# Patient Record
Sex: Male | Born: 1940
Health system: Southern US, Community
[De-identification: ages and names within clinical notes are randomized; demographics above are authoritative.]

## PROBLEM LIST (undated history)

## (undated) DIAGNOSIS — I10 Essential (primary) hypertension: Secondary | ICD-10-CM

## (undated) DIAGNOSIS — G40209 Localization-related (focal) (partial) symptomatic epilepsy and epileptic syndromes with complex partial seizures, not intractable, without status epilepticus: Secondary | ICD-10-CM

## (undated) DIAGNOSIS — N3020 Other chronic cystitis without hematuria: Secondary | ICD-10-CM

## (undated) DIAGNOSIS — I491 Atrial premature depolarization: Secondary | ICD-10-CM

## (undated) DIAGNOSIS — K649 Unspecified hemorrhoids: Secondary | ICD-10-CM

## (undated) DIAGNOSIS — H53002 Unspecified amblyopia, left eye: Secondary | ICD-10-CM

## (undated) DIAGNOSIS — M1990 Unspecified osteoarthritis, unspecified site: Secondary | ICD-10-CM

## (undated) DIAGNOSIS — Z8619 Personal history of other infectious and parasitic diseases: Secondary | ICD-10-CM

## (undated) DIAGNOSIS — Z8719 Personal history of other diseases of the digestive system: Secondary | ICD-10-CM

## (undated) DIAGNOSIS — K5730 Diverticulosis of large intestine without perforation or abscess without bleeding: Secondary | ICD-10-CM

## (undated) DIAGNOSIS — R351 Nocturia: Secondary | ICD-10-CM

## (undated) DIAGNOSIS — R569 Unspecified convulsions: Secondary | ICD-10-CM

## (undated) DIAGNOSIS — R350 Frequency of micturition: Secondary | ICD-10-CM

## (undated) DIAGNOSIS — R3915 Urgency of urination: Secondary | ICD-10-CM

## (undated) HISTORY — PX: TRANSTHORACIC ECHOCARDIOGRAM: SHX275

---
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
## 1997-09-26 ENCOUNTER — Emergency Department (HOSPITAL_COMMUNITY): Admission: EM | Admit: 1997-09-26 | Discharge: 1997-09-26 | Payer: Self-pay | Admitting: Internal Medicine

## 2003-01-04 ENCOUNTER — Emergency Department (HOSPITAL_COMMUNITY): Admission: EM | Admit: 2003-01-04 | Discharge: 2003-01-04 | Payer: Self-pay | Admitting: Emergency Medicine

## 2003-01-06 ENCOUNTER — Emergency Department (HOSPITAL_COMMUNITY): Admission: AD | Admit: 2003-01-06 | Discharge: 2003-01-06 | Payer: Self-pay | Admitting: Family Medicine

## 2003-01-08 ENCOUNTER — Encounter: Payer: Self-pay | Admitting: Family Medicine

## 2003-01-08 ENCOUNTER — Emergency Department (HOSPITAL_COMMUNITY): Admission: AD | Admit: 2003-01-08 | Discharge: 2003-01-08 | Payer: Self-pay | Admitting: Family Medicine

## 2005-05-20 ENCOUNTER — Encounter: Admission: RE | Admit: 2005-05-20 | Discharge: 2005-05-20 | Payer: Self-pay | Admitting: Neurology

## 2005-12-06 ENCOUNTER — Emergency Department (HOSPITAL_COMMUNITY): Admission: EM | Admit: 2005-12-06 | Discharge: 2005-12-06 | Payer: Self-pay | Admitting: Family Medicine

## 2008-10-07 ENCOUNTER — Encounter: Admission: RE | Admit: 2008-10-07 | Discharge: 2008-10-07 | Payer: Self-pay | Admitting: Neurology

## 2009-07-25 ENCOUNTER — Ambulatory Visit (HOSPITAL_COMMUNITY): Admission: RE | Admit: 2009-07-25 | Discharge: 2009-07-25 | Payer: Self-pay | Admitting: Family Medicine

## 2009-09-15 ENCOUNTER — Ambulatory Visit
Admission: RE | Admit: 2009-09-15 | Discharge: 2009-09-15 | Payer: Self-pay | Source: Home / Self Care | Admitting: General Surgery

## 2009-09-15 HISTORY — PX: INGUINAL HERNIA REPAIR: SUR1180

## 2010-04-15 ENCOUNTER — Encounter: Payer: Self-pay | Admitting: Neurology

## 2010-06-10 LAB — BASIC METABOLIC PANEL
BUN: 7 mg/dL (ref 6–23)
CO2: 29 mEq/L (ref 19–32)
Calcium: 9.5 mg/dL (ref 8.4–10.5)
Chloride: 106 mEq/L (ref 96–112)
Creatinine, Ser: 0.89 mg/dL (ref 0.4–1.5)
GFR calc Af Amer: >60 mL/min (ref >60)
GFR calc non Af Amer: >60 mL/min (ref >60)
Glucose, Bld: 100 mg/dL — ABNORMAL HIGH (ref 70–99)
Potassium: 4.2 mEq/L (ref 3.5–5.1)
Sodium: 138 mEq/L (ref 135–145)

## 2010-06-10 LAB — DIFFERENTIAL
Eosinophils Absolute: 0.1 10*3/uL (ref 0.0–0.7)
Eosinophils Relative: 1 % (ref 0–5)
Lymphocytes Relative: 60 % — ABNORMAL HIGH (ref 12–46)
Lymphs Abs: 2.9 10*3/uL (ref 0.7–4.0)
Monocytes Absolute: 0.3 10*3/uL (ref 0.1–1.0)
Monocytes Relative: 6 % (ref 3–12)
Myelocytes: 0 %
Neutro Abs: 1.6 10*3/uL — ABNORMAL LOW (ref 1.7–7.7)
Neutrophils Relative %: 32 % — ABNORMAL LOW (ref 43–77)
nRBC: 0 /100 WBC

## 2010-06-10 LAB — CBC
Hemoglobin: 13.3 g/dL (ref 13.0–17.0)
Platelets: 221 10*3/uL (ref 150–400)
RBC: 3.99 MIL/uL — ABNORMAL LOW (ref 4.22–5.81)
WBC: 5 10*3/uL (ref 4.0–10.5)

## 2010-06-12 LAB — CBC
MCHC: 34.1 g/dL (ref 30.0–36.0)
MCV: 97.2 fL (ref 78.0–100.0)
RBC: 4.36 MIL/uL (ref 4.22–5.81)
RDW: 13.8 % (ref 11.5–15.5)

## 2010-06-12 LAB — HEMOGLOBIN A1C: Hgb A1c MFr Bld: 6 % — ABNORMAL HIGH (ref <5.7)

## 2010-06-12 LAB — COMPREHENSIVE METABOLIC PANEL
AST: 21 U/L (ref 0–37)
CO2: 27 mEq/L (ref 19–32)
Calcium: 9.3 mg/dL (ref 8.4–10.5)
Creatinine, Ser: 0.9 mg/dL (ref 0.4–1.5)
GFR calc Af Amer: >60 mL/min (ref >60)
GFR calc non Af Amer: >60 mL/min (ref >60)

## 2010-06-12 LAB — LIPID PANEL
Cholesterol: 155 mg/dL (ref 0–200)
LDL Cholesterol: 94 mg/dL (ref 0–99)

## 2011-03-06 ENCOUNTER — Other Ambulatory Visit: Payer: Self-pay | Admitting: Neurology

## 2011-03-06 DIAGNOSIS — G40309 Generalized idiopathic epilepsy and epileptic syndromes, not intractable, without status epilepticus: Secondary | ICD-10-CM

## 2011-03-06 DIAGNOSIS — M818 Other osteoporosis without current pathological fracture: Secondary | ICD-10-CM

## 2011-03-06 DIAGNOSIS — S48019A Complete traumatic amputation at unspecified shoulder joint, initial encounter: Secondary | ICD-10-CM

## 2011-03-14 ENCOUNTER — Ambulatory Visit
Admission: RE | Admit: 2011-03-14 | Discharge: 2011-03-14 | Disposition: A | Discharge status: Home / Self Care | Payer: Medicare Other | Source: Ambulatory Visit | Attending: Neurology | Admitting: Neurology

## 2011-03-14 DIAGNOSIS — M818 Other osteoporosis without current pathological fracture: Secondary | ICD-10-CM

## 2011-03-14 DIAGNOSIS — G40309 Generalized idiopathic epilepsy and epileptic syndromes, not intractable, without status epilepticus: Secondary | ICD-10-CM

## 2011-10-21 ENCOUNTER — Inpatient Hospital Stay (HOSPITAL_COMMUNITY)
Admission: EM | Admit: 2011-10-21 | Discharge: 2011-10-24 | DRG: 378 | Disposition: A | Discharge status: Home / Self Care | Payer: Medicare Other | Attending: Internal Medicine | Admitting: Internal Medicine

## 2011-10-21 ENCOUNTER — Encounter (HOSPITAL_COMMUNITY): Payer: Self-pay | Admitting: Emergency Medicine

## 2011-10-21 DIAGNOSIS — K922 Gastrointestinal hemorrhage, unspecified: Secondary | ICD-10-CM | POA: Diagnosis present

## 2011-10-21 DIAGNOSIS — E785 Hyperlipidemia, unspecified: Secondary | ICD-10-CM | POA: Diagnosis present

## 2011-10-21 DIAGNOSIS — K5730 Diverticulosis of large intestine without perforation or abscess without bleeding: Secondary | ICD-10-CM | POA: Diagnosis present

## 2011-10-21 DIAGNOSIS — I10 Essential (primary) hypertension: Secondary | ICD-10-CM | POA: Diagnosis present

## 2011-10-21 DIAGNOSIS — G40909 Epilepsy, unspecified, not intractable, without status epilepticus: Secondary | ICD-10-CM | POA: Diagnosis present

## 2011-10-21 DIAGNOSIS — K5731 Diverticulosis of large intestine without perforation or abscess with bleeding: Principal | ICD-10-CM | POA: Diagnosis present

## 2011-10-21 DIAGNOSIS — D62 Acute posthemorrhagic anemia: Secondary | ICD-10-CM | POA: Diagnosis present

## 2011-10-21 HISTORY — DX: Essential (primary) hypertension: I10

## 2011-10-21 HISTORY — DX: Unspecified convulsions: R56.9

## 2011-10-21 HISTORY — DX: Unspecified hemorrhoids: K64.9

## 2011-10-21 HISTORY — DX: Unspecified osteoarthritis, unspecified site: M19.90

## 2011-10-21 LAB — CBC WITH DIFFERENTIAL/PLATELET
Eosinophils Relative: 0 % (ref 0–5)
HCT: 32.4 % — ABNORMAL LOW (ref 39.0–52.0)
Lymphocytes Relative: 23 % (ref 12–46)
Lymphs Abs: 1.8 10*3/uL (ref 0.7–4.0)
MCV: 90.5 fL (ref 78.0–100.0)
Monocytes Absolute: 0.4 10*3/uL (ref 0.1–1.0)
RBC: 3.58 MIL/uL — ABNORMAL LOW (ref 4.22–5.81)
WBC: 8 10*3/uL (ref 4.0–10.5)

## 2011-10-21 LAB — COMPREHENSIVE METABOLIC PANEL
ALT: 8 U/L (ref 0–53)
CO2: 23 mEq/L (ref 19–32)
Calcium: 8.8 mg/dL (ref 8.4–10.5)
Creatinine, Ser: 0.77 mg/dL (ref 0.50–1.35)
GFR calc Af Amer: >90 mL/min (ref >90)
GFR calc non Af Amer: 89 mL/min — ABNORMAL LOW (ref >90)
Glucose, Bld: 99 mg/dL (ref 70–99)

## 2011-10-21 LAB — ABO/RH: ABO/RH(D): A POS

## 2011-10-21 LAB — MAGNESIUM: Magnesium: 1.7 mg/dL (ref 1.5–2.5)

## 2011-10-21 LAB — PHOSPHORUS: Phosphorus: 3.3 mg/dL (ref 2.3–4.6)

## 2011-10-21 LAB — PROTIME-INR
INR: 1.09 (ref 0.00–1.49)
INR: 1.08 (ref 0.00–1.49)
Prothrombin Time: 14.2 seconds (ref 11.6–15.2)

## 2011-10-21 LAB — APTT: aPTT: 25 seconds (ref 24–37)

## 2011-10-21 MED ORDER — LISINOPRIL 20 MG PO TABS
20.0000 mg | ORAL_TABLET | Freq: Every day | ORAL | Status: DC
  Administered 2011-10-22 – 2011-10-23 (×2): 20 mg via ORAL
  Filled 2011-10-21 (×3): qty 1

## 2011-10-21 MED ORDER — PANTOPRAZOLE SODIUM 40 MG IV SOLR
40.0000 mg | Freq: Two times a day (BID) | INTRAVENOUS | Status: DC
  Administered 2011-10-21 – 2011-10-23 (×4): 40 mg via INTRAVENOUS
  Filled 2011-10-21 (×5): qty 40

## 2011-10-21 MED ORDER — PHENYTOIN SODIUM EXTENDED 100 MG PO CAPS
300.0000 mg | ORAL_CAPSULE | Freq: Every day | ORAL | Status: DC
  Administered 2011-10-21 – 2011-10-23 (×3): 300 mg via ORAL
  Filled 2011-10-21 (×4): qty 3

## 2011-10-21 MED ORDER — ONDANSETRON HCL 4 MG/2ML IJ SOLN: 4.0000 mg | Freq: Four times a day (QID) | INTRAMUSCULAR | Status: DC | PRN

## 2011-10-21 MED ORDER — SODIUM CHLORIDE 0.9 % IV SOLN
INTRAVENOUS | Status: DC
  Administered 2011-10-21: 19:00:00 via INTRAVENOUS
  Administered 2011-10-22: 125 mL/h via INTRAVENOUS
  Administered 2011-10-22: 03:00:00 via INTRAVENOUS

## 2011-10-21 MED ORDER — SODIUM CHLORIDE 0.9 % IJ SOLN
3.0000 mL | Freq: Two times a day (BID) | INTRAMUSCULAR | Status: DC
  Administered 2011-10-23 – 2011-10-24 (×4): 3 mL via INTRAVENOUS

## 2011-10-21 MED ORDER — SIMVASTATIN 10 MG PO TABS
10.0000 mg | ORAL_TABLET | Freq: Every day | ORAL | Status: DC
  Administered 2011-10-22 – 2011-10-23 (×2): 10 mg via ORAL
  Filled 2011-10-21 (×3): qty 1

## 2011-10-21 MED ORDER — ONDANSETRON HCL 4 MG PO TABS: 4.0000 mg | ORAL_TABLET | Freq: Four times a day (QID) | ORAL | Status: DC | PRN

## 2011-10-21 NOTE — ED Provider Notes (Signed)
History     CSN: [id number]  Arrival date & time 10/21/11  1656   First MD Initiated Contact with Patient 10/21/11 1936      Chief Complaint  Patient presents with  . Rectal Bleeding    (Consider location/radiation/quality/duration/timing/severity/associated sxs/prior treatment) Patient is a 71 y.o. male presenting with hematochezia. The history is provided by the patient.  Rectal Bleeding    patient here complaining of rectal bleeding which began today. Denies any abdominal pain or vomiting. No fever noted. Blood has been bright red. History of hemorrhoids in the past. Denies any dizziness or near-syncope. No hematemesis. Patient called EMS and was transported here  Past Medical History  Diagnosis Date  . Hemorrhoids   . Arthritis   . Sleep disorder   . Seizures     Past Surgical History  Procedure Date  . Hernia repair   . Gallstone     Family History  Problem Relation Age of Onset  . Cancer Mother   . Cancer Sister   . Cancer Brother     History  Substance Use Topics  . Smoking status: Former Games developer  . Smokeless tobacco: Never Used  . Alcohol Use: No      Review of Systems  Gastrointestinal: Positive for hematochezia.  All other systems reviewed and are negative.    Allergies  Penicillins  Home Medications   Current Outpatient Rx  Name Route Sig Dispense Refill  . ASPIRIN 325 MG PO TABS Oral Take 325 mg by mouth as needed. Knee pain.    . IBUPROFEN 800 MG PO TABS Oral Take 800 mg by mouth 2 (two) times daily as needed. Knee pain.    Marland Kitchen LISINOPRIL 20 MG PO TABS Oral Take 20 mg by mouth daily.    Marland Kitchen PHENYTOIN SODIUM EXTENDED 100 MG PO CAPS Oral Take 300 mg by mouth at bedtime.    Marland Kitchen PRAVASTATIN SODIUM 20 MG PO TABS Oral Take 20 mg by mouth daily.      BP 117/73  Pulse 77  Temp 97.7 F (36.5 C) (Oral)  Resp 13  Ht 5\' 6"  (1.676 m)  Wt 157 lb (71.215 kg)  BMI 25.34 kg/m2  SpO2 100%  Physical Exam  Nursing note and vitals  reviewed. Constitutional: He is oriented to person, place, and time. He appears well-developed and well-nourished.  Non-toxic appearance. No distress.  HENT:  Head: Normocephalic and atraumatic.  Eyes: Conjunctivae, EOM and lids are normal. Pupils are equal, round, and reactive to light.  Neck: Normal range of motion. Neck supple. No tracheal deviation present. No mass present.  Cardiovascular: Normal rate, regular rhythm and normal heart sounds.  Exam reveals no gallop.   No murmur heard. Pulmonary/Chest: Effort normal and breath sounds normal. No stridor. No respiratory distress. He has no decreased breath sounds. He has no wheezes. He has no rhonchi. He has no rales.  Abdominal: Soft. Normal appearance and bowel sounds are normal. He exhibits no distension. There is no tenderness. There is no rebound and no CVA tenderness.  Genitourinary:       Gross blood noted in rectum  Musculoskeletal: Normal range of motion. He exhibits no edema and no tenderness.  Neurological: He is alert and oriented to person, place, and time. He has normal strength. No cranial nerve deficit or sensory deficit. GCS eye subscore is 4. GCS verbal subscore is 5. GCS motor subscore is 6.  Skin: Skin is warm and dry. No abrasion and no rash noted.  Psychiatric: He  has a normal mood and affect. His speech is normal and behavior is normal.    ED Course  Procedures (including critical care time)   Labs Reviewed  CBC WITH DIFFERENTIAL  BASIC METABOLIC PANEL  COMPREHENSIVE METABOLIC PANEL  PROTIME-INR  APTT  TYPE AND SCREEN   No results found.   No diagnosis found.    MDM  Pt to be admitted for eval of gi bleed        Toy Baker, MD 10/21/11 2358

## 2011-10-21 NOTE — ED Notes (Signed)
Please call Millicent (daughter) when pt's status is known. She will pick up pt if pt gets discharged. [phone]
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
## 2011-10-21 NOTE — ED Notes (Signed)
Per EMS pt stated blood in the stool. Pt states stool is a light color. Pt denies nausea and vomiting. Pt denies pain. Pt states hx of hemorrhoids. Pt states he take ibuprofen every morning and took some this morning.

## 2011-10-21 NOTE — ED Notes (Signed)
[id number]<AV> Expected date:10/21/11<BR> Expected time: 4:33 PM<BR> Means of arrival:Ambulance<BR> Comments:<BR> Rectal bleeding

## 2011-10-21 NOTE — H&P (Signed)
[facility] History and Physical  Cauy Melody WUJ:[id number] DOB: [birth year] DOA: 10/21/2011  Referring physician: ED physician PCP: No primary provider on file.   Chief Complaint: blood in stool  HPI:  71 year old male with history of seizure disorder, HTN and dyslipidemia who presented with complaints of noticing blood in stool on the morning of this admission. Patient reported history of hemorrhoids in past and occasional bleed. There was no associated nausea or vomiting, no abdominal pain, no lightheadedness or loss of consciousness. No complaints of chest pain, no shortness of breath, no cough. No fever or chills.  Assessment and Plan:  Principal Problem:  *Acute blood loss anemia - secondary to rectal bleed likely secondary to aspirin - hold aspirin - hemoglobin on admission 11.4 - will continue to monitor CBC - no transfusion indicated at this time - may continue IV fluids  Active Problems: HTN - continue lisinopril  Seizure disorder - continue dilantin  Dyslipidemia - continue statin   Code Status: Full Family Communication: Pt at bedside Disposition Plan: PT evaluation    Review of Systems:  Constitutional: Negative for fever, chills and malaise/fatigue. Negative for diaphoresis.  HENT: Negative for hearing loss, ear pain, nosebleeds, congestion, sore throat, neck pain, tinnitus and ear discharge.   Eyes: Negative for blurred vision, double vision, photophobia, pain, discharge and redness.  Respiratory: Negative for cough, hemoptysis, sputum production, shortness of breath, wheezing and stridor.   Cardiovascular: Negative for chest pain, palpitations, orthopnea, claudication and leg swelling.  Gastrointestinal: per HPI Genitourinary: Negative for dysuria, urgency, frequency, hematuria and flank pain.  Musculoskeletal: Negative for myalgias, back pain, joint pain and falls.  Skin: Negative for itching and rash.  Neurological: Negative for dizziness and  weakness. Negative for tingling, tremors, sensory change, speech change, focal weakness, loss of consciousness and headaches.  Endo/Heme/Allergies: Negative for environmental allergies and polydipsia. Does not bruise/bleed easily.  Psychiatric/Behavioral: Negative for suicidal ideas. The patient is not nervous/anxious.      Past Medical History  Diagnosis Date  . Hemorrhoids   . Arthritis   . Sleep disorder   . Seizures     Past Surgical History  Procedure Date  . Hernia repair   . Gallstone     Social History:  reports that he has quit smoking. He has never used smokeless tobacco. He reports that he does not drink alcohol or use illicit drugs.  Allergies  Allergen Reactions  . Penicillins Itching    Family History  Problem Relation Age of Onset  . Cancer Mother   . Cancer Sister   . Cancer Brother     Prior to Admission medications   Medication Sig Start Date End Date Taking? Authorizing Provider  aspirin 325 MG tablet Take 325 mg by mouth as needed. Knee pain.   Yes Historical Provider, MD  ibuprofen (ADVIL,MOTRIN) 800 MG tablet Take 800 mg by mouth 2 (two) times daily as needed. Knee pain.   Yes Historical Provider, MD  lisinopril (PRINIVIL,ZESTRIL) 20 MG tablet Take 20 mg by mouth daily.   Yes Historical Provider, MD  phenytoin (DILANTIN) 100 MG ER capsule Take 300 mg by mouth at bedtime.   Yes Historical Provider, MD  pravastatin (PRAVACHOL) 20 MG tablet Take 20 mg by mouth daily.   Yes Historical Provider, MD    Physical Exam: Filed Vitals:   10/21/11 1658 10/21/11 1707 10/21/11 1841  BP: 155/81  117/73  Pulse: 89  77  Temp: 97.7 F (36.5 C)    TempSrc:  Oral    Resp: 18  13  Height: 5\' 6"  (1.676 m)    Weight: 157 lb (71.215 kg)    SpO2: 99% 98% 100%    Physical Exam  Constitutional: Appears well-developed and well-nourished. No distress.  HENT: Normocephalic. External right and left ear normal. Oropharynx is clear and moist.  Eyes: Conjunctivae and EOM  are normal. PERRLA, no scleral icterus.  Neck: Normal ROM. Neck supple. No JVD. No tracheal deviation. No thyromegaly.  CVS: RRR, S1/S2 +, no murmurs, no gallops, no carotid bruit.  Pulmonary: Effort and breath sounds normal, no stridor, rhonchi, wheezes, rales.  Abdominal: Soft. BS +,  no distension, tenderness, rebound or guarding.  Musculoskeletal: Normal range of motion. No edema and no tenderness.  Lymphadenopathy: No lymphadenopathy noted, cervical, inguinal. Neuro: Alert. Normal reflexes, muscle tone coordination. No cranial nerve deficit. Skin: Skin is warm and dry. No rash noted. Not diaphoretic. No erythema. No pallor.  Psychiatric: Normal mood and affect. Behavior, judgment, thought content normal.   Labs on Admission:  Basic Metabolic Panel:  Lab 10/21/11 9147  NA 136  K 4.0  CL 102  CO2 23  GLUCOSE 99  BUN 10  CREATININE 0.77  CALCIUM 8.8  MG --  PHOS --   Liver Function Tests:  Lab 10/21/11 2010  AST 11  ALT 8  ALKPHOS 96  BILITOT 0.2*  PROT 6.7  ALBUMIN 3.4*   No results found for this basename: LIPASE:5,AMYLASE:5 in the last 168 hours No results found for this basename: AMMONIA:5 in the last 168 hours CBC:  Lab 10/21/11 2010  WBC 8.0  NEUTROABS 5.8  HGB 11.4*  HCT 32.4*  MCV 90.5  PLT 227   Cardiac Enzymes: No results found for this basename: CKTOTAL:5,CKMB:5,CKMBINDEX:5,TROPONINI:5 in the last 168 hours BNP: No components found with this basename: POCBNP:5 CBG: No results found for this basename: GLUCAP:5 in the last 168 hours  Radiological Exams on Admission: No results found.  EKG: Normal sinus rhythm, no ST/T wave changes  Debbora Presto, MD  [facility] Pager [phone]  If 7PM-7AM, please contact night-coverage [email or web address] Password [facility] 10/21/2011, 9:37 PM

## 2011-10-22 DIAGNOSIS — G40909 Epilepsy, unspecified, not intractable, without status epilepticus: Secondary | ICD-10-CM

## 2011-10-22 DIAGNOSIS — E785 Hyperlipidemia, unspecified: Secondary | ICD-10-CM

## 2011-10-22 DIAGNOSIS — I10 Essential (primary) hypertension: Secondary | ICD-10-CM

## 2011-10-22 LAB — CBC
HCT: 29.2 % — ABNORMAL LOW (ref 39.0–52.0)
Hemoglobin: 10.2 g/dL — ABNORMAL LOW (ref 13.0–17.0)
MCH: 31.9 pg (ref 26.0–34.0)
MCV: 91.3 fL (ref 78.0–100.0)
RBC: 3.2 MIL/uL — ABNORMAL LOW (ref 4.22–5.81)

## 2011-10-22 LAB — GLUCOSE, CAPILLARY: Glucose-Capillary: 105 mg/dL — ABNORMAL HIGH (ref 70–99)

## 2011-10-22 LAB — COMPREHENSIVE METABOLIC PANEL
AST: 10 U/L (ref 0–37)
Albumin: 3 g/dL — ABNORMAL LOW (ref 3.5–5.2)
Calcium: 8.6 mg/dL (ref 8.4–10.5)
Creatinine, Ser: 0.76 mg/dL (ref 0.50–1.35)
GFR calc non Af Amer: 89 mL/min — ABNORMAL LOW (ref >90)
Total Protein: 6 g/dL (ref 6.0–8.3)

## 2011-10-22 LAB — MRSA PCR SCREENING: MRSA by PCR: NEGATIVE

## 2011-10-22 NOTE — Progress Notes (Signed)
[facility] PROGRESS NOTE  Phillip Hill ZOX:[id number] DOB: [birth year] DOA: 10/21/2011 PCP: No primary provider on file.  Assessment/Plan: Principal Problem:  *Acute blood loss anemia Active Problems:  HTN (hypertension)  Seizure disorder  Dyslipidemia  1. ?hemmorrhoidal bleed- no further episodes yet, will heme test stools when he has one, trend CBC.  If stable may D/C home with GI done as outpatient, stop IVF, AM labs 2. HTN- stable 3. seizure d/o- dilantin   4. Anemia- ABLA vs dilutional, trend, transfuse if necessary    Code Status: full Family Communication: none Disposition Plan: home    HPI/Subjective: Had episode of bright red blood in stool yesterday- had another episode 25 years ago and had a colonoscopy at that point with no cancer found- said a "pocket" opened and bleed No BM since in hospital   Objective: Filed Vitals:   10/21/11 1841 10/21/11 2339 10/22/11 0013 10/22/11 0541  BP: 117/73 129/73 130/84 111/64  Pulse: 77 89 81 80  Temp:  98.8 F (37.1 C) 97.9 F (36.6 C) 98.6 F (37 C)  TempSrc:  Oral Oral Oral  Resp: 13 16 18 18   Height:   5\' 6"  (1.676 m)   Weight:   71.215 kg (157 lb) 71.215 kg (157 lb)  SpO2: 100% 99% 98% 100%    Intake/Output Summary (Last 24 hours) at 10/22/11 1144 Last data filed at 10/22/11 0702  Gross per 24 hour  Intake 1547.92 ml  Output    700 ml  Net 847.92 ml    Exam:  General: Appears well-developed and well-nourished. No distress.  CVS: RRR, S1/S2 +, no murmurs, no gallops, no carotid bruit.  Pulmonary: Effort and breath sounds normal, no stridor, rhonchi, wheezes, rales.  Abdominal: Soft. BS +, no distension, tenderness, rebound or guarding.  Neuro: Alert. Normal reflexes, muscle tone coordination. No cranial nerve deficit.  Skin: Skin is warm and dry. No rash noted. Not diaphoretic. No erythema. No pallor.     Data Reviewed: Basic Metabolic Panel:  Lab 10/22/11 8119 10/21/11 2255 10/21/11 2010  NA 138  -- 136  K 3.8 -- 4.0  CL 106 -- 102  CO2 23 -- 23  GLUCOSE 94 -- 99  BUN 9 -- 10  CREATININE 0.76 -- 0.77  CALCIUM 8.6 -- 8.8  MG -- 1.7 --  PHOS -- 3.3 --   Liver Function Tests:  Lab 10/22/11 0455 10/21/11 2010  AST 10 11  ALT 7 8  ALKPHOS 91 96  BILITOT 0.3 0.2*  PROT 6.0 6.7  ALBUMIN 3.0* 3.4*   No results found for this basename: LIPASE:5,AMYLASE:5 in the last 168 hours No results found for this basename: AMMONIA:5 in the last 168 hours CBC:  Lab 10/22/11 0455 10/21/11 2010  WBC 6.8 8.0  NEUTROABS -- 5.8  HGB 10.2* 11.4*  HCT 29.2* 32.4*  MCV 91.3 90.5  PLT 206 227   Cardiac Enzymes: No results found for this basename: CKTOTAL:5,CKMB:5,CKMBINDEX:5,TROPONINI:5 in the last 168 hours BNP (last 3 results) No results found for this basename: PROBNP:3 in the last 8760 hours CBG:  Lab 10/22/11 0747  GLUCAP 105*    Recent Results (from the past 240 hour(s))  MRSA PCR SCREENING     Status: Normal   Collection Time   10/22/11 12:29 AM      Component Value Range Status Comment   MRSA by PCR NEGATIVE  NEGATIVE Final      Studies: No results found.  Scheduled Meds:   . lisinopril  20  mg Oral Daily  . pantoprazole (PROTONIX) IV  40 mg Intravenous Q12H  . phenytoin  300 mg Oral QHS  . simvastatin  10 mg Oral q1800  . sodium chloride  3 mL Intravenous Q12H  - Continuous Infusions:   . DISCONTD: sodium chloride 125 mL/hr (10/22/11 1001)    Principal Problem:  *Acute blood loss anemia Active Problems:  HTN (hypertension)  Seizure disorder  Dyslipidemia    Time spent: 103    Manya Balash  [facility] 10/22/2011, 11:44 AM  LOS: 1 day

## 2011-10-22 NOTE — Evaluation (Signed)
Physical Therapy One time Evaluation Patient Details Name: Phillip Hill MRN: [id number] DOB: October 10, 1940 Today's Date: 10/22/2011 Time: 7829-5621 PT Time Calculation (min): 11 min  PT Assessment / Plan / Recommendation Clinical Impression  Pt presents with acute blood loss anemia.  Tolerated ambulation in hallway at supervision level with no AD and no c/o dizziness/lightheadedness.  Pt is Mod I for bed mobility and transfers.  Pt will not require any further follow up therapy at home or in acute venue.  PT to sign off on pt at this time.     PT Assessment  Patent does not need any further PT services    Follow Up Recommendations  No PT follow up    Barriers to Discharge        Equipment Recommendations  None recommended by PT    Recommendations for Other Services     Frequency      Precautions / Restrictions Precautions Precautions: None Restrictions Weight Bearing Restrictions: No   Pertinent Vitals/Pain No pain      Mobility  Bed Mobility Bed Mobility: Supine to Sit Supine to Sit: 6: Modified independent (Device/Increase time) Transfers Transfers: Sit to Stand;Stand to Sit Sit to Stand: 6: Modified independent (Device/Increase time) Stand to Sit: 6: Modified independent (Device/Increase time) Ambulation/Gait Ambulation/Gait Assistance: 4: Min guard;5: Supervision Ambulation Distance (Feet): 200 Feet Assistive device: None Ambulation/Gait Assistance Details: Min/guard initially then supervision for safety.  Gait Pattern: Step-through pattern;Within Functional Limits Gait velocity: WFL Stairs: No Wheelchair Mobility Wheelchair Mobility: No    Exercises     PT Diagnosis:    PT Problem List:   PT Treatment Interventions:     PT Goals    Visit Information  Last PT Received On: 10/22/11 Assistance Needed: +1    Subjective Data  Subjective: I'll get up Patient Stated Goal: to get back home   Prior Functioning  Home Living Lives With: Other (Comment) (Lives  with brother) Available Help at Discharge: Available 24 hours/day Type of Home: House Home Access: Level entry Home Layout: One level Home Adaptive Equipment: Straight cane Prior Function Level of Independence: Independent Able to Take Stairs?: Yes Communication Communication: No difficulties    Cognition  Overall Cognitive Status: Appears within functional limits for tasks assessed/performed Arousal/Alertness: Awake/alert Orientation Level: Appears intact for tasks assessed Behavior During Session: [facility] for tasks performed    Extremity/Trunk Assessment Right Lower Extremity Assessment RLE ROM/Strength/Tone: [facility] for tasks assessed RLE Coordination: WFL - gross/fine motor Left Lower Extremity Assessment LLE ROM/Strength/Tone: WFL for tasks assessed LLE Coordination: WFL - gross/fine motor Trunk Assessment Trunk Assessment: Normal   Balance    End of Session PT - End of Session Activity Tolerance: Patient tolerated treatment well Patient left: in chair;with call bell/phone within reach  GP     Page, Meribeth Mattes 10/22/2011, 1:42 PM

## 2011-10-23 DIAGNOSIS — D62 Acute posthemorrhagic anemia: Secondary | ICD-10-CM

## 2011-10-23 DIAGNOSIS — K922 Gastrointestinal hemorrhage, unspecified: Secondary | ICD-10-CM

## 2011-10-23 DIAGNOSIS — K5730 Diverticulosis of large intestine without perforation or abscess without bleeding: Secondary | ICD-10-CM

## 2011-10-23 LAB — CBC
MCH: 32.4 pg (ref 26.0–34.0)
MCV: 92.3 fL (ref 78.0–100.0)
Platelets: 206 10*3/uL (ref 150–400)
RBC: 2.99 MIL/uL — ABNORMAL LOW (ref 4.22–5.81)
RDW: 13.3 % (ref 11.5–15.5)
WBC: 6.2 10*3/uL (ref 4.0–10.5)

## 2011-10-23 LAB — BASIC METABOLIC PANEL
CO2: 25 mEq/L (ref 19–32)
Calcium: 8.8 mg/dL (ref 8.4–10.5)
Chloride: 105 mEq/L (ref 96–112)
Creatinine, Ser: 0.81 mg/dL (ref 0.50–1.35)
GFR calc Af Amer: >90 mL/min (ref >90)
Sodium: 138 mEq/L (ref 135–145)

## 2011-10-23 LAB — GLUCOSE, CAPILLARY: Glucose-Capillary: 85 mg/dL (ref 70–99)

## 2011-10-23 MED ORDER — PEG-KCL-NACL-NASULF-NA ASC-C 100 G PO SOLR
1.0000 | Freq: Once | ORAL | Status: AC
  Administered 2011-10-23: 100 g via ORAL
  Filled 2011-10-23: qty 1

## 2011-10-23 NOTE — Consult Note (Signed)
Referring Provider: [facility]-Dr. Jomarie Longs Primary Care Physician:  No primary provider on file. Primary Gastroenterologist:  none  Reason for Consultation:   Lower Gi bleed  HPI: Phillip Hill is a 71 y.o. male admitted to the hospital on 10/21/2011 with acute onset of rectal bleeding . Patient reports that he had onset on Monday morning with a bowel movement associated with a large amount of bright red blood. He said he had no associated abdominal pain, cramping, or rectal discomfort. He had 3 episodes total at home prior to presenting to the emergency room and says all of these were large amount of dark and red blood. Since arrival to the hospital he has not had any further active bleeding. He says recently he has been feeling well, has no complaints of abdominal discomfort, no nausea, heartburn, indigestion, dysphagia etc. He says he does have some difficulty with constipation but had not been constipated recently. He has not noted any changes in his bowel habits.  The patient does take one whole aspirin daily and also uses ibuprofen as needed. He reports that he had a remote colonoscopy done by Dr. Victorino Dike  20-25 years ago and was told that he had "pockets" in his colon at that time. He is no history of GI bleeding since but says he was having some bright blood at that time- but not did not require hospitalization. Family history is negative for colon cancer and polyps.  Hemoglobin on addition 10/21/2011 was 11.4, this is drifted to 9.7 today.   Past Medical History  Diagnosis Date  . Hemorrhoids   . Arthritis   . Sleep disorder   . Seizures     Past Surgical History  Procedure Date  . Hernia repair   . Gallstone     Prior to Admission medications   Medication Sig Start Date End Date Taking? Authorizing Provider  aspirin 325 MG tablet Take 325 mg by mouth as needed. Knee pain.   Yes Historical Provider, MD  ibuprofen (ADVIL,MOTRIN) 800 MG tablet Take 800 mg by mouth 2  (two) times daily as needed. Knee pain.   Yes Historical Provider, MD  lisinopril (PRINIVIL,ZESTRIL) 20 MG tablet Take 20 mg by mouth daily.   Yes Historical Provider, MD  phenytoin (DILANTIN) 100 MG ER capsule Take 300 mg by mouth at bedtime.   Yes Historical Provider, MD  pravastatin (PRAVACHOL) 20 MG tablet Take 20 mg by mouth daily.   Yes Historical Provider, MD    Current Facility-Administered Medications  Medication Dose Route Frequency Provider Last Rate Last Dose  . lisinopril (PRINIVIL,ZESTRIL) tablet 20 mg  20 mg Oral Daily Dorothea Ogle, MD   20 mg at 10/23/11 0914  . ondansetron (ZOFRAN) tablet 4 mg  4 mg Oral Q6H PRN Dorothea Ogle, MD       Or  . ondansetron [facility]) injection 4 mg  4 mg Intravenous Q6H PRN Dorothea Ogle, MD      . peg 3350 powder (MOVIPREP) 100 G kit 100 g  1 kit Oral Once Amy S Esterwood, PA      . phenytoin (DILANTIN) ER capsule 300 mg  300 mg Oral QHS Dorothea Ogle, MD   300 mg at 10/22/11 2138  . simvastatin (ZOCOR) tablet 10 mg  10 mg Oral q1800 Dorothea Ogle, MD   10 mg at 10/22/11 1840  . sodium chloride 0.9 % injection 3 mL  3 mL Intravenous Q12H Dorothea Ogle, MD   3 mL  at 10/23/11 0915  . DISCONTD: pantoprazole (PROTONIX) injection 40 mg  40 mg Intravenous Q12H Dorothea Ogle, MD   40 mg at 10/23/11 0914    Allergies as of 10/21/2011 - Review Complete 10/21/2011  Allergen Reaction Noted  . Penicillins Itching 10/21/2011    Family History  Problem Relation Age of Onset  . Cancer Mother   . Cancer Sister   . Cancer Brother     History   Social History  . Marital Status: Divorced    Spouse Name: N/A    Number of Children: N/A  . Years of Education: N/A   Occupational History  . Not on file.   Social History Main Topics  . Smoking status: Former Games developer  . Smokeless tobacco: Never Used  . Alcohol Use: No  . Drug Use: No  . Sexually Active: Yes    Birth Control/ Protection: Condom   Other Topics Concern  . Not on file   Social  History Narrative  . No narrative on file    Review of Systems: Pertinent positive and negative review of systems were noted in the above HPI section.  All other review of systems was otherwise negative.Marland Kitchen  Physical Exam: Vital signs in last 24 hours: Temp:  [98.1 F (36.7 C)-98.3 F (36.8 C)] 98.3 F (36.8 C) (07/31 0559) Pulse Rate:  [63-73] 71  (07/31 0559) Resp:  [18] 18  (07/31 0559) BP: (124-133)/(57-72) 124/57 mmHg (07/31 0559) SpO2:  [98 %-99 %] 99 % (07/31 0559) Weight:  [151 lb 6.4 oz (68.675 kg)] 151 lb 6.4 oz (68.675 kg) (07/31 0700) Last BM Date: 10/21/11 General:   Alert,  Well-developed, well-nourished, pleasant and cooperative in NAD Head:  Normocephalic and atraumatic. Eyes:  Sclera clear, no icterus.   Conjunctiva pink. Ears:  Normal auditory acuity. Nose:  No deformity, discharge,  or lesions. Mouth:  No deformity or lesions.   Neck:  Supple; no masses or thyromegaly. Lungs:  Clear throughout to auscultation.   No wheezes, crackles, or rhonchi. Heart:  Regular rate and rhythm; no murmurs, clicks, rubs,  or gallops. Abdomen:  Soft,nontender, BS active,nonpalp mass or hsm.   Rectal:  Deferred  Msk:  Symmetrical without gross deformities. . Pulses:  Normal pulses noted. Extremities:  Without clubbing or edema. Neurologic:  Alert and  oriented x4;  grossly normal neurologically. Skin:  Intact without significant lesions or rashes.. Psych:  Alert and cooperative. Normal mood and affect.  Intake/Output from previous day: 07/30 0701 - 07/31 0700 In: 1787.9 [P.O.:240; I.V.:1547.9] Out: 2100 [Urine:2100] Intake/Output this shift: Total I/O In: -  Out: 700 [Urine:700]  Lab Results:  [facility] 10/23/11 0445 10/22/11 0455 10/21/11 2010  WBC 6.2 6.8 8.0  HGB 9.7* 10.2* 11.4*  HCT 27.6* 29.2* 32.4*  PLT 206 206 227   BMET  Basename 10/23/11 0445 10/22/11 0455 10/21/11 2010  NA 138 138 136  K 3.6 3.8 4.0  CL 105 106 102  CO2 25 23 23   GLUCOSE 91 94 99    BUN 9 9 10   CREATININE 0.81 0.76 0.77  CALCIUM 8.8 8.6 8.8   LFT  Basename 10/22/11 0455  PROT 6.0  ALBUMIN 3.0*  AST 10  ALT 7  ALKPHOS 91  BILITOT 0.3  BILIDIR --  IBILI --   PT/INR  Basename 10/21/11 2255 10/21/11 2010  LABPROT 14.2 14.3  INR 1.08 1.09    IMPRESSION:  #53 71 year old man with acute lower GI bleed, painless and resolved. Course is most consistent  with an acute diverticular hemorrhage. #2 normocytic anemia secondary to acute blood loss #3 history of seizure disorder #4 hyperlipidemia #5 history of hypertension  PLAN: #1 patient has not had a colonoscopy for over 20 years, and therefore repeat colonoscopy is indicated at this time. Procedure is scheduled with Dr. Christella Hartigan for tomorrow August 1 at about 2:30 PM. Procedure was discussed in detail with the patient and he is agreeable to proceed. He'll be placed back on a clear liquid diet, and undergo bowel prep this evening #2 continue serial hemoglobin #3 hold aspirin   Amy Esterwood  10/23/2011, 1:13 PM    ________________________________________________________________________  [facility] MD note:  I personally examined the patient, reviewed the data and agree with the assessment and plan described above.  Very nice man with resolved bleeding.  Planning on colonsocoyp tomorrow.   Rob Bunting, MD [facility] Pager [phone]

## 2011-10-23 NOTE — Progress Notes (Signed)
[facility] PROGRESS NOTE  Phillip Hill ZOX:[id number] DOB: [birth year] DOA: 10/21/2011 PCP: No primary provider on file.  Assessment/Plan: 1. Lower GI bleed, Monday night 3 episodes (cupful, per patient), I dont suspect a hemmorrhoidal bleed given the large volume- could be diverticular/AVM/malignancy etc no further episodes yet, Discussed with [facility], If a colonoscopy could be done in next 24h will keep him here otherwise pursue as outpatient.  2. Acute blood loss anemia: from 1, baseline Hb in 6/22 was 13.3, has trended down to 9.7 now, no overt further bleeding since Monday, transfuse if continues to trend down or symptomatic 3. HTN- stable 4. seizure d/o- dilantin     Code Status: full Family Communication: none Disposition Plan: home    HPI/Subjective: No further bleeding   Objective: Filed Vitals:   10/22/11 1410 10/22/11 2245 10/23/11 0559 10/23/11 0700  BP: 133/62 128/72 124/57   Pulse: 63 73 71   Temp: 98.1 F (36.7 C) 98.2 F (36.8 C) 98.3 F (36.8 C)   TempSrc: Oral Oral Oral   Resp: 18 18 18    Height:      Weight:    68.675 kg (151 lb 6.4 oz)  SpO2: 99% 98% 99%     Intake/Output Summary (Last 24 hours) at 10/23/11 1135 Last data filed at 10/23/11 0914  Gross per 24 hour  Intake    240 ml  Output   2000 ml  Net  -1760 ml    Exam:  General: Appears well-developed and well-nourished. No distress.  CVS: RRR, S1/S2 +, no murmurs, no gallops, no carotid bruit.  Pulmonary: Effort and breath sounds normal, no stridor, rhonchi, wheezes, rales.  Abdominal: Soft. BS +, no distension, tenderness, rebound or guarding.  Neuro: Alert. Normal reflexes, muscle tone coordination. No cranial nerve deficit.  Skin: Skin is warm and dry. No rash noted. Not diaphoretic. No erythema. No pallor.     Data Reviewed: Basic Metabolic Panel:  Lab 10/23/11 8119 10/22/11 0455 10/21/11 2255 10/21/11 2010  NA 138 138 -- 136  K 3.6 3.8 -- 4.0  CL 105 106 -- 102  CO2 25 23  -- 23  GLUCOSE 91 94 -- 99  BUN 9 9 -- 10  CREATININE 0.81 0.76 -- 0.77  CALCIUM 8.8 8.6 -- 8.8  MG -- -- 1.7 --  PHOS -- -- 3.3 --   Liver Function Tests:  Lab 10/22/11 0455 10/21/11 2010  AST 10 11  ALT 7 8  ALKPHOS 91 96  BILITOT 0.3 0.2*  PROT 6.0 6.7  ALBUMIN 3.0* 3.4*   No results found for this basename: LIPASE:5,AMYLASE:5 in the last 168 hours No results found for this basename: AMMONIA:5 in the last 168 hours CBC:  Lab 10/23/11 0445 10/22/11 0455 10/21/11 2010  WBC 6.2 6.8 8.0  NEUTROABS -- -- 5.8  HGB 9.7* 10.2* 11.4*  HCT 27.6* 29.2* 32.4*  MCV 92.3 91.3 90.5  PLT 206 206 227   Cardiac Enzymes: No results found for this basename: CKTOTAL:5,CKMB:5,CKMBINDEX:5,TROPONINI:5 in the last 168 hours BNP (last 3 results) No results found for this basename: PROBNP:3 in the last 8760 hours CBG:  Lab 10/23/11 0729 10/22/11 0747  GLUCAP 85 105*    Recent Results (from the past 240 hour(s))  MRSA PCR SCREENING     Status: Normal   Collection Time   10/22/11 12:29 AM      Component Value Range Status Comment   MRSA by PCR NEGATIVE  NEGATIVE Final      Studies:  No results found.  Scheduled Meds:    . lisinopril  20 mg Oral Daily  . phenytoin  300 mg Oral QHS  . simvastatin  10 mg Oral q1800  . sodium chloride  3 mL Intravenous Q12H  . DISCONTD: pantoprazole (PROTONIX) IV  40 mg Intravenous Q12H  - Continuous Infusions:    . DISCONTD: sodium chloride 125 mL/hr (10/22/11 1001)    Principal Problem:  *Acute blood loss anemia Active Problems:  HTN (hypertension)  Seizure disorder  Dyslipidemia    Time spent: 55    Phillip Hill  [facility] [phone] 10/23/2011, 11:35 AM  LOS: 2 days

## 2011-10-24 ENCOUNTER — Encounter (HOSPITAL_COMMUNITY)
Admission: EM | Disposition: A | Discharge status: Home / Self Care | Payer: Self-pay | Source: Home / Self Care | Attending: Internal Medicine

## 2011-10-24 ENCOUNTER — Encounter (HOSPITAL_COMMUNITY): Payer: Self-pay

## 2011-10-24 ENCOUNTER — Ambulatory Visit (HOSPITAL_COMMUNITY): Admit: 2011-10-24 | Payer: Self-pay | Admitting: Gastroenterology

## 2011-10-24 DIAGNOSIS — K5730 Diverticulosis of large intestine without perforation or abscess without bleeding: Secondary | ICD-10-CM | POA: Diagnosis present

## 2011-10-24 HISTORY — PX: COLONOSCOPY: SHX5424

## 2011-10-24 LAB — CBC
MCHC: 34.8 g/dL (ref 30.0–36.0)
RDW: 13.4 % (ref 11.5–15.5)
WBC: 7.1 10*3/uL (ref 4.0–10.5)

## 2011-10-24 LAB — GLUCOSE, CAPILLARY: Glucose-Capillary: 124 mg/dL — ABNORMAL HIGH (ref 70–99)

## 2011-10-24 SURGERY — COLONOSCOPY
Anesthesia: Moderate Sedation

## 2011-10-24 MED ORDER — SODIUM CHLORIDE 0.9 % IV SOLN
Freq: Once | INTRAVENOUS | Status: AC
  Administered 2011-10-24: 14:00:00 via INTRAVENOUS

## 2011-10-24 MED ORDER — MIDAZOLAM HCL 5 MG/5ML IJ SOLN
INTRAMUSCULAR | Status: DC | PRN
  Administered 2011-10-24 (×2): 2 mg via INTRAVENOUS

## 2011-10-24 MED ORDER — FENTANYL CITRATE 0.05 MG/ML IJ SOLN
INTRAMUSCULAR | Status: DC | PRN
  Administered 2011-10-24 (×2): 25 ug via INTRAVENOUS

## 2011-10-24 NOTE — Interval H&P Note (Signed)
History and Physical Interval Note:  10/24/2011 1:18 PM  Phillip Hill  has presented today for surgery, with the diagnosis of gi bleed  The various methods of treatment have been discussed with the patient and family. After consideration of risks, benefits and other options for treatment, the patient has consented to  Procedure(s) (LRB): COLONOSCOPY (N/A) as a surgical intervention .  The patient's history has been reviewed, patient examined, no change in status, stable for surgery.  I have reviewed the patient's chart and labs.  Questions were answered to the patient's satisfaction.     Rob Bunting

## 2011-10-24 NOTE — Progress Notes (Signed)
Patient ID: Phillip Hill, male   DOB: 03/06/1941, 71 y.o.   MRN: [id number] [facility] Progress Note  Subjective: Tolerated prep without difficulty,no active bleeding. hgb stable at 10 this am  Objective:  Vital signs in last 24 hours: Temp:  [98.1 F (36.7 C)-99.3 F (37.4 C)] 98.7 F (37.1 C) (08/01 0625) Pulse Rate:  [75-88] 76  (08/01 0625) Resp:  [18] 18  (08/01 0625) BP: (115-141)/(69-74) 115/69 mmHg (08/01 0625) SpO2:  [96 %-98 %] 98 % (08/01 0625) Weight:  [153 lb 6.4 oz (69.582 kg)] 153 lb 6.4 oz (69.582 kg) (08/01 0625) Last BM Date: 10/23/11 General:   Alert,  Well-developed,    in NAD Heart:  Regular rate and rhythm; no murmurs Pulm;clear Abdomen:  Soft, nontender and nondistended. Normal bowel sounds, without guarding, Extremities:  Without edema. Neurologic:  Alert and  oriented x4;  grossly normal neurologically. Psych:  Alert and cooperative. Normal mood and affect.  Intake/Output from previous day: 07/31 0701 - 08/01 0700 In: 240 [P.O.:240] Out: 1250 [Urine:1250] Intake/Output this shift:    Lab Results:  Basename 10/24/11 0434 10/23/11 0445 10/22/11 0455  WBC 7.1 6.2 6.8  HGB 10.0* 9.7* 10.2*  HCT 28.7* 27.6* 29.2*  PLT 213 206 206   BMET  Basename 10/23/11 0445 10/22/11 0455 10/21/11 2010  NA 138 138 136  K 3.6 3.8 4.0  CL 105 106 102  CO2 25 23 23  GLUCOSE 91 94 99  BUN 9 9 10  CREATININE 0.81 0.76 0.77  CALCIUM 8.8 8.6 8.8   LFT  Basename 10/22/11 0455  PROT 6.0  ALBUMIN 3.0*  AST 10  ALT 7  ALKPHOS 91  BILITOT 0.3  BILIDIR --  IBILI --   PT/INR  Basename 10/21/11 2255 10/21/11 2010  LABPROT 14.2 14.3  INR 1.08 1.09    Assessment / Plan: #1  71 yo male with acute lower GI bleed-resolved. Suspect diverticular- cannot r/o occult lesion/AVM etc Pt is scheduled for colonoscopy today Have advised him to stay off ASA and nsaids for a few weeks, use tylenol Should be OK for discharge the evening  Pending findings on  colonoscopy. #2 anemia-secondary to acute blood loss-stable-no transfusion requirement Principal Problem:  *Acute blood loss anemia Active Problems:  HTN (hypertension)  Seizure disorder  Dyslipidemia  GI bleeding     LOS: 3 days   Phillip Hill  10/24/2011, 9:14 AM     

## 2011-10-24 NOTE — H&P (View-Only) (Signed)
Patient ID: Phillip Hill, male   DOB: 1940/05/29, 71 y.o.   MRN: [id number] [facility] Progress Note  Subjective: Tolerated prep without difficulty,no active bleeding. hgb stable at 10 this am  Objective:  Vital signs in last 24 hours: Temp:  [98.1 F (36.7 C)-99.3 F (37.4 C)] 98.7 F (37.1 C) (08/01 0625) Pulse Rate:  [75-88] 76  (08/01 0625) Resp:  [18] 18  (08/01 0625) BP: (115-141)/(69-74) 115/69 mmHg (08/01 0625) SpO2:  [96 %-98 %] 98 % (08/01 0625) Weight:  [153 lb 6.4 oz (69.582 kg)] 153 lb 6.4 oz (69.582 kg) (08/01 0625) Last BM Date: 10/23/11 General:   Alert,  Well-developed,    in NAD Heart:  Regular rate and rhythm; no murmurs Pulm;clear Abdomen:  Soft, nontender and nondistended. Normal bowel sounds, without guarding, Extremities:  Without edema. Neurologic:  Alert and  oriented x4;  grossly normal neurologically. Psych:  Alert and cooperative. Normal mood and affect.  Intake/Output from previous day: 07/31 0701 - 08/01 0700 In: 240 [P.O.:240] Out: 1250 [Urine:1250] Intake/Output this shift:    Lab Results:  Basename 10/24/11 0434 10/23/11 0445 10/22/11 0455  WBC 7.1 6.2 6.8  HGB 10.0* 9.7* 10.2*  HCT 28.7* 27.6* 29.2*  PLT 213 206 206   BMET  Basename 10/23/11 0445 10/22/11 0455 10/21/11 2010  NA 138 138 136  K 3.6 3.8 4.0  CL 105 106 102  CO2 25 23 23   GLUCOSE 91 94 99  BUN 9 9 10   CREATININE 0.81 0.76 0.77  CALCIUM 8.8 8.6 8.8   LFT  Basename 10/22/11 0455  PROT 6.0  ALBUMIN 3.0*  AST 10  ALT 7  ALKPHOS 91  BILITOT 0.3  BILIDIR --  IBILI --   PT/INR  Basename 10/21/11 2255 10/21/11 2010  LABPROT 14.2 14.3  INR 1.08 1.09    Assessment / Plan: #1  71 yo male with acute lower GI bleed-resolved. Suspect diverticular- cannot r/o occult lesion/AVM etc Pt is scheduled for colonoscopy today Have advised him to stay off ASA and nsaids for a few weeks, use tylenol Should be OK for discharge the evening  Pending findings on  colonoscopy. #2 anemia-secondary to acute blood loss-stable-no transfusion requirement Principal Problem:  *Acute blood loss anemia Active Problems:  HTN (hypertension)  Seizure disorder  Dyslipidemia  GI bleeding     LOS: 3 days   Regino Fournet  10/24/2011, 9:14 AM

## 2011-10-24 NOTE — Op Note (Signed)
[facility] [address] [place], [place]  [address]  COLONOSCOPY PROCEDURE REPORT  PATIENT:  Phillip Hill, Phillip Hill  MR#:  [id number] BIRTHDATE:  Nov 07, 1940, 71 yrs. old  GENDER:  male ENDOSCOPIST:  Rachael Fee, MD PROCEDURE DATE:  10/24/2011 PROCEDURE:  Colonoscopy 19147 ASA CLASS:  Class II INDICATIONS:  recent overt rectal bleeding, painless, resolved on its own MEDICATIONS:   Fentanyl 50 mcg IV, Versed 4 mg IV  DESCRIPTION OF PROCEDURE:   After the risks benefits and alternatives of the procedure were thoroughly explained, informed consent was obtained.  Digital rectal exam was performed and revealed no rectal masses.   The Pentax Colonoscope [id number] endoscope was introduced through the anus and advanced to the cecum, which was identified by both the appendix and ileocecal valve, without limitations.  The quality of the prep was good.. The instrument was then slowly withdrawn as the colon was fully examined.<<PROCEDUREIMAGES>> FINDINGS:  Moderate diverticulosis was found throughout the colon (see image1).  This was otherwise a normal examination of the colon (see image2, image4, and image5).   Retroflexed views in the rectum revealed no abnormalities. COMPLICATIONS:  None  ENDOSCOPIC IMPRESSION: 1) Moderate diverticulosis throughout the colon 2) Otherwise normal examination;  recent bleeding was likely from extensive, pan-colonic diverticulosis.  RECOMMENDATIONS: OK to discharge home.  He should call GI or go to ER if signficant rebleeding occurs.  ______________________________ Rachael Fee, MD  n. eSIGNED:   Rachael Fee at 10/24/2011 02:03 PM  [name], [phone]

## 2011-10-24 NOTE — Discharge Summary (Signed)
Physician Discharge Summary  Patient ID: Phillip Hill MRN: [id number] DOB/AGE: September 21, 1940 71 y.o.  Admit date: 10/21/2011 Discharge date: 10/24/2011  Primary Care Physician:  Clyda Greener, MD  Discharge Diagnoses:   1. Pan colonic diverticulosis 2. HTN (hypertension) 3. Seizure disorder 4. Dyslipidemia 5. Lower GI bleeding-reolved 6. Acute blood loss anemia   Medication List  As of 10/24/2011  3:27 PM   STOP taking these medications         aspirin 325 MG tablet      ibuprofen 800 MG tablet         TAKE these medications         lisinopril 20 MG tablet   Commonly known as: PRINIVIL,ZESTRIL   Take 20 mg by mouth daily.      phenytoin 100 MG ER capsule   Commonly known as: DILANTIN   Take 300 mg by mouth at bedtime.      pravastatin 20 MG tablet   Commonly known as: PRAVACHOL   Take 20 mg by mouth daily.           Disposition and Follow-up:  PCP in 1 week  Consults: [facility]  Significant Diagnostic Studies:   Colonoscopy: ENDOSCOPIC IMPRESSION:  1) Moderate diverticulosis throughout the colon  2) Otherwise normal examination; recent bleeding was likely  from extensive, pan-colonic diverticulosis.   Brief H and P: 71 year old male with history of seizure disorder, HTN and dyslipidemia who presented with complaints of noticing blood in stool on the morning of this admission. Patient reported history of hemorrhoids in past and occasional bleed. There was no associated nausea or vomiting, no abdominal pain, no lightheadedness or loss of consciousness. No complaints of chest pain, no shortness of breath, no cough. No fever or chills.  Hospital Course:  1. Lower GI bleed, Diverticular suspected, extensive diverticulosis on colonoscopy without active bleeding,  Monday night had  3 episodes of bright red bleeding (cupful, per patient), was seen by GI in consultation and underwent colonoscopy as noted above, did not have any further bleeding during  hospitalization. 2. Acute blood loss anemia: from 1, baseline Hb in 6/22 was 13.3, has trended down to 9.7 -10 range , no overt further bleeding since admission, recommend repeat CBC in 10days 3. HTN- stable 4. seizure d/o- dilantin   Time spent on Discharge:  Signed: Brady Schiller [facility] Pager: [phone] 10/24/2011, 3:27 PM

## 2011-10-24 NOTE — Progress Notes (Signed)
Patient discharged home with daughter, alert and oriented, discharge instructions given, patient verbalize understanding of discharge instructions given, patient in stable condition at this time

## 2011-10-25 ENCOUNTER — Encounter (HOSPITAL_COMMUNITY): Payer: Self-pay

## 2011-10-25 ENCOUNTER — Encounter (HOSPITAL_COMMUNITY): Payer: Self-pay | Admitting: Gastroenterology

## 2012-04-27 ENCOUNTER — Encounter: Payer: Self-pay | Admitting: *Deleted

## 2012-04-27 ENCOUNTER — Encounter: Payer: Self-pay | Admitting: Neurology

## 2012-06-09 ENCOUNTER — Ambulatory Visit: Payer: Self-pay | Admitting: Neurology

## 2012-07-02 ENCOUNTER — Ambulatory Visit (INDEPENDENT_AMBULATORY_CARE_PROVIDER_SITE_OTHER): Payer: Medicaid Other | Admitting: Neurology

## 2012-07-02 ENCOUNTER — Encounter: Payer: Self-pay | Admitting: Neurology

## 2012-07-02 VITALS — BP 171/91 | HR 96 | Ht 65.0 in | Wt 156.0 lb

## 2012-07-02 DIAGNOSIS — S48019A Complete traumatic amputation at unspecified shoulder joint, initial encounter: Secondary | ICD-10-CM | POA: Insufficient documentation

## 2012-07-02 DIAGNOSIS — I10 Essential (primary) hypertension: Secondary | ICD-10-CM

## 2012-07-02 DIAGNOSIS — G479 Sleep disorder, unspecified: Secondary | ICD-10-CM | POA: Insufficient documentation

## 2012-07-02 DIAGNOSIS — M1990 Unspecified osteoarthritis, unspecified site: Secondary | ICD-10-CM

## 2012-07-02 DIAGNOSIS — G250 Essential tremor: Secondary | ICD-10-CM

## 2012-07-02 DIAGNOSIS — M129 Arthropathy, unspecified: Secondary | ICD-10-CM

## 2012-07-02 DIAGNOSIS — G252 Other specified forms of tremor: Secondary | ICD-10-CM

## 2012-07-02 DIAGNOSIS — E785 Hyperlipidemia, unspecified: Secondary | ICD-10-CM

## 2012-07-02 DIAGNOSIS — K649 Unspecified hemorrhoids: Secondary | ICD-10-CM | POA: Insufficient documentation

## 2012-07-02 DIAGNOSIS — R569 Unspecified convulsions: Secondary | ICD-10-CM

## 2012-07-02 MED ORDER — PHENYTOIN SODIUM EXTENDED 100 MG PO CAPS: 300.0000 mg | ORAL_CAPSULE | Freq: Every day | ORAL | Status: DC

## 2012-07-02 MED ORDER — GLUCOSAMINE HCL 750 MG PO TABS: 750.0000 mg | ORAL_TABLET | Freq: Two times a day (BID) | ORAL | Status: DC
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
## 2012-09-18 ENCOUNTER — Other Ambulatory Visit: Payer: Self-pay | Admitting: Neurology

## 2012-10-16 ENCOUNTER — Telehealth: Payer: Self-pay | Admitting: Neurology

## 2012-10-19 ENCOUNTER — Telehealth: Payer: Self-pay

## 2012-10-19 MED ORDER — PHENYTOIN SODIUM EXTENDED 100 MG PO CAPS: 300.0000 mg | ORAL_CAPSULE | Freq: Every day | ORAL | Status: DC

## 2012-10-19 NOTE — Telephone Encounter (Signed)
Patient called stating he wants a new Rx sent to Dilantin because he wants #100 instead of #90.  Says this is how Dr Sandria Manly always filled his meds in the past.  Told him we will update and resend Rx.  Patient verbalized understanding.

## 2012-10-19 NOTE — Telephone Encounter (Signed)
PT.NEEDS TO DISCUSS MEDICATION PHENYTOINSOD EXT 100MG 

## 2012-12-28 ENCOUNTER — Other Ambulatory Visit: Payer: Self-pay | Admitting: Neurology

## 2013-07-26 ENCOUNTER — Emergency Department (HOSPITAL_COMMUNITY): Payer: Medicare Other

## 2013-07-26 ENCOUNTER — Emergency Department (HOSPITAL_COMMUNITY)
Admission: EM | Admit: 2013-07-26 | Discharge: 2013-07-26 | Disposition: A | Discharge status: Home / Self Care | Payer: Medicare Other | Attending: Emergency Medicine | Admitting: Emergency Medicine

## 2013-07-26 DIAGNOSIS — I10 Essential (primary) hypertension: Secondary | ICD-10-CM | POA: Insufficient documentation

## 2013-07-26 DIAGNOSIS — Z8739 Personal history of other diseases of the musculoskeletal system and connective tissue: Secondary | ICD-10-CM | POA: Insufficient documentation

## 2013-07-26 DIAGNOSIS — S48019A Complete traumatic amputation at unspecified shoulder joint, initial encounter: Secondary | ICD-10-CM | POA: Insufficient documentation

## 2013-07-26 DIAGNOSIS — G40909 Epilepsy, unspecified, not intractable, without status epilepticus: Secondary | ICD-10-CM | POA: Insufficient documentation

## 2013-07-26 DIAGNOSIS — Z79899 Other long term (current) drug therapy: Secondary | ICD-10-CM | POA: Insufficient documentation

## 2013-07-26 DIAGNOSIS — Z87891 Personal history of nicotine dependence: Secondary | ICD-10-CM | POA: Insufficient documentation

## 2013-07-26 DIAGNOSIS — Z880 Allergy status to penicillin: Secondary | ICD-10-CM | POA: Insufficient documentation

## 2013-07-26 DIAGNOSIS — E860 Dehydration: Secondary | ICD-10-CM

## 2013-07-26 DIAGNOSIS — E785 Hyperlipidemia, unspecified: Secondary | ICD-10-CM | POA: Insufficient documentation

## 2013-07-26 LAB — COMPREHENSIVE METABOLIC PANEL
ALBUMIN: 3.8 g/dL (ref 3.5–5.2)
ALK PHOS: 126 U/L — AB (ref 39–117)
ALT: 11 U/L (ref 0–53)
AST: 16 U/L (ref 0–37)
BILIRUBIN TOTAL: 0.4 mg/dL (ref 0.3–1.2)
BUN: 11 mg/dL (ref 6–23)
CHLORIDE: 98 meq/L (ref 96–112)
CO2: 26 meq/L (ref 19–32)
Calcium: 9.5 mg/dL (ref 8.4–10.5)
Creatinine, Ser: 0.98 mg/dL (ref 0.50–1.35)
GFR calc Af Amer: >90 mL/min (ref >90)
GFR, EST NON AFRICAN AMERICAN: 79 mL/min — AB (ref >90)
Glucose, Bld: 117 mg/dL — ABNORMAL HIGH (ref 70–99)
POTASSIUM: 3.9 meq/L (ref 3.7–5.3)
Sodium: 138 mEq/L (ref 137–147)
Total Protein: 7.7 g/dL (ref 6.0–8.3)

## 2013-07-26 LAB — CBC WITH DIFFERENTIAL/PLATELET
BASOS ABS: 0 10*3/uL (ref 0.0–0.1)
BASOS PCT: 0 % (ref 0–1)
Eosinophils Absolute: 0.1 10*3/uL (ref 0.0–0.7)
Eosinophils Relative: 1 % (ref 0–5)
HCT: 39.5 % (ref 39.0–52.0)
HEMOGLOBIN: 14.2 g/dL (ref 13.0–17.0)
LYMPHS PCT: 30 % (ref 12–46)
Lymphs Abs: 1.6 10*3/uL (ref 0.7–4.0)
MCH: 33.1 pg (ref 26.0–34.0)
MCHC: 35.9 g/dL (ref 30.0–36.0)
MCV: 92.1 fL (ref 78.0–100.0)
MONOS PCT: 11 % (ref 3–12)
Monocytes Absolute: 0.6 10*3/uL (ref 0.1–1.0)
NEUTROS ABS: 3.1 10*3/uL (ref 1.7–7.7)
NEUTROS PCT: 58 % (ref 43–77)
Platelets: 236 10*3/uL (ref 150–400)
RBC: 4.29 MIL/uL (ref 4.22–5.81)
RDW: 12.9 % (ref 11.5–15.5)
WBC: 5.4 10*3/uL (ref 4.0–10.5)

## 2013-07-26 LAB — PHENYTOIN LEVEL, TOTAL: Phenytoin Lvl: 7 ug/mL — ABNORMAL LOW (ref 10.0–20.0)

## 2013-07-26 LAB — TROPONIN I

## 2013-07-26 MED ORDER — SODIUM CHLORIDE 0.9 % IV BOLUS (SEPSIS)
1000.0000 mL | Freq: Once | INTRAVENOUS | Status: AC
  Administered 2013-07-26: 1000 mL via INTRAVENOUS

## 2013-07-26 MED ORDER — SODIUM CHLORIDE 0.9 % IV SOLN: INTRAVENOUS | Status: DC

## 2013-07-26 NOTE — ED Provider Notes (Signed)
CSN: [id number]     Arrival date & time 07/26/13  1323 History   First MD Initiated Contact with Patient 07/26/13 1341     Chief Complaint  Patient presents with  . Near Syncope     (Consider location/radiation/quality/duration/timing/severity/associated sxs/prior Treatment) Patient is a 73 y.o. male presenting with near-syncope. The history is provided by the patient.  Near Syncope   after having a near syncopal event with associated dizziness prior to arrival. Patient notes decreased oral intake recently. Denies any vomiting. No chest pain chest pressure. No dyspnea. No abdominal pain. No black or bloody stools. EMS was called and patient was noted to be orthostatic. Given IV fluids and feels better at this time. Denies any recent illnesses. No recent changes in his medications.  Past Medical History  Diagnosis Date  . Hemorrhoids   . Arthritis   . Sleep disorder   . Seizures   . Hypertension   . Idiopathic osteoporosis   . High blood pressure   . Other and unspecified hyperlipidemia   . Upper limb amputation, shoulder   . Essential and other specified forms of tremor    Past Surgical History  Procedure Laterality Date  . Hernia repair    . Gallstone    . Arm amputation  1960  . Colonoscopy  10/24/2011    Procedure: COLONOSCOPY;  Surgeon: Milus Banister, MD;  Location: [facility] ENDOSCOPY;  Service: Endoscopy;  Laterality: N/A;   Family History  Problem Relation Age of Onset  . Cancer Mother   . Cancer Sister   . Cancer Brother    History  Substance Use Topics  . Smoking status: Former Smoker    Types: Cigarettes    Quit date: 10/23/1968  . Smokeless tobacco: Never Used  . Alcohol Use: No     Comment: Quit alcohol in 1970    Review of Systems  Cardiovascular: Positive for near-syncope.  All other systems reviewed and are negative.     Allergies  Penicillins  Home Medications   Prior to Admission medications   Medication Sig Start Date End Date Taking?  Authorizing Provider  Glucosamine HCl 750 MG TABS Take 750 mg by mouth 2 (two) times daily. 07/02/12   Marcial Pacas, MD  lisinopril (PRINIVIL,ZESTRIL) 20 MG tablet Take 20 mg by mouth daily.    Historical Provider, MD  phenytoin (DILANTIN) 100 MG ER capsule Take 3 capsules (300 mg total) by mouth at bedtime. 10/19/12   Marcial Pacas, MD  pravastatin (PRAVACHOL) 20 MG tablet Take 20 mg by mouth daily.    Historical Provider, MD   BP 135/82  Temp(Src) 98.1 F (36.7 C) (Oral)  Resp 16  SpO2 97% Physical Exam  Nursing note and vitals reviewed. Constitutional: He is oriented to person, place, and time. He appears well-developed and well-nourished.  Non-toxic appearance. No distress.  HENT:  Head: Normocephalic and atraumatic.  Eyes: Conjunctivae, EOM and lids are normal. Pupils are equal, round, and reactive to light.  Neck: Normal range of motion. Neck supple. No tracheal deviation present. No mass present.  Cardiovascular: Normal rate, regular rhythm and normal heart sounds.  Exam reveals no gallop.   No murmur heard. Pulmonary/Chest: Effort normal and breath sounds normal. No stridor. No respiratory distress. He has no decreased breath sounds. He has no wheezes. He has no rhonchi. He has no rales.  Abdominal: Soft. Normal appearance and bowel sounds are normal. He exhibits no distension. There is no tenderness. There is no rebound and no CVA  tenderness.  Musculoskeletal: Normal range of motion. He exhibits no edema and no tenderness.  Neurological: He is alert and oriented to person, place, and time. He has normal strength. No cranial nerve deficit or sensory deficit. GCS eye subscore is 4. GCS verbal subscore is 5. GCS motor subscore is 6.  Skin: Skin is warm and dry. No abrasion and no rash noted.  Psychiatric: He has a normal mood and affect. His speech is normal and behavior is normal.    ED Course  Procedures (including critical care time) Labs Review Labs Reviewed  TROPONIN I  CBC WITH  DIFFERENTIAL  COMPREHENSIVE METABOLIC PANEL  PHENYTOIN LEVEL, TOTAL    Imaging Review No results found.   EKG Interpretation   Date/Time:  Monday Jul 26 2013 13:38:41 EDT Ventricular Rate:  97 PR Interval:  213 QRS Duration: 70 QT Interval:  335 QTC Calculation: 425 R Axis:   29 Text Interpretation:  Sinus rhythm Borderline prolonged PR interval  Borderline T wave abnormalities Confirmed by Zenia Resides  MD, Sumeya Yontz ([id number]) on  07/26/2013 1:42:23 PM      MDM   Final diagnoses:  None    Patient given IV fluids here and feels better. Symptoms from dehydration he is stable for discharge. Do not think this represents ACS or acute neurological process   Leota Jacobsen, MD 07/26/13 [phone]

## 2013-07-26 NOTE — ED Notes (Signed)
Patient started feeling like he may faint, EMS was called, reported he was orthostatic there.  He received PIV and 184ml NS bolus.  Patient has no complaints at this time.

## 2013-07-26 NOTE — Discharge Instructions (Signed)
Dehydration, Adult Dehydration is when you lose more fluids from the body than you take in. Vital organs like the kidneys, brain, and heart cannot function without a proper amount of fluids and salt. Any loss of fluids from the body can cause dehydration.  CAUSES   Vomiting.  Diarrhea.  Excessive sweating.  Excessive urine output.  Fever. SYMPTOMS  Mild dehydration  Thirst.  Dry lips.  Slightly dry mouth. Moderate dehydration  Very dry mouth.  Sunken eyes.  Skin does not bounce back quickly when lightly pinched and released.  Dark urine and decreased urine production.  Decreased tear production.  Headache. Severe dehydration  Very dry mouth.  Extreme thirst.  Rapid, weak pulse (more than 100 beats per minute at rest).  Cold hands and feet.  Not able to sweat in spite of heat and temperature.  Rapid breathing.  Blue lips.  Confusion and lethargy.  Difficulty being awakened.  Minimal urine production.  No tears. DIAGNOSIS  Your caregiver will diagnose dehydration based on your symptoms and your exam. Blood and urine tests will help confirm the diagnosis. The diagnostic evaluation should also identify the cause of dehydration. TREATMENT  Treatment of mild or moderate dehydration can often be done at home by increasing the amount of fluids that you drink. It is best to drink small amounts of fluid more often. Drinking too much at one time can make vomiting worse. Refer to the home care instructions below. Severe dehydration needs to be treated at the hospital where you will probably be given intravenous (IV) fluids that contain water and electrolytes. HOME CARE INSTRUCTIONS   Ask your caregiver about specific rehydration instructions.  Drink enough fluids to keep your urine clear or pale yellow.  Drink small amounts frequently if you have nausea and vomiting.  Eat as you normally do.  Avoid:  Foods or drinks high in sugar.  Carbonated  drinks.  Juice.  Extremely hot or cold fluids.  Drinks with caffeine.  Fatty, greasy foods.  Alcohol.  Tobacco.  Overeating.  Gelatin desserts.  Wash your hands well to avoid spreading bacteria and viruses.  Only take over-the-counter or prescription medicines for pain, discomfort, or fever as directed by your caregiver.  Ask your caregiver if you should continue all prescribed and over-the-counter medicines.  Keep all follow-up appointments with your caregiver. SEEK MEDICAL CARE IF:  You have abdominal pain and it increases or stays in one area (localizes).  You have a rash, stiff neck, or severe headache.  You are irritable, sleepy, or difficult to awaken.  You are weak, dizzy, or extremely thirsty. SEEK IMMEDIATE MEDICAL CARE IF:   You are unable to keep fluids down or you get worse despite treatment.  You have frequent episodes of vomiting or diarrhea.  You have blood or green matter (bile) in your vomit.  You have blood in your stool or your stool looks black and tarry.  You have not urinated in 6 to 8 hours, or you have only urinated a small amount of very dark urine.  You have a fever.  You faint. MAKE SURE YOU:   Understand these instructions.  Will watch your condition.  Will get help right away if you are not doing well or get worse. Document Released: 03/11/2005 Document Revised: 06/03/2011 Document Reviewed: 10/29/2010 [employer] Patient Information 2014 [employer].  

## 2013-08-17 ENCOUNTER — Other Ambulatory Visit (HOSPITAL_COMMUNITY): Payer: Self-pay | Admitting: Family Medicine

## 2013-08-17 DIAGNOSIS — I499 Cardiac arrhythmia, unspecified: Secondary | ICD-10-CM

## 2013-08-18 ENCOUNTER — Ambulatory Visit (HOSPITAL_COMMUNITY)
Admission: RE | Admit: 2013-08-18 | Discharge: 2013-08-18 | Disposition: A | Discharge status: Home / Self Care | Payer: Medicare Other | Source: Ambulatory Visit | Attending: Family Medicine | Admitting: Family Medicine

## 2013-08-18 DIAGNOSIS — I2129 ST elevation (STEMI) myocardial infarction involving other sites: Secondary | ICD-10-CM | POA: Diagnosis present

## 2013-08-18 DIAGNOSIS — I499 Cardiac arrhythmia, unspecified: Secondary | ICD-10-CM

## 2013-10-20 ENCOUNTER — Encounter: Payer: Self-pay | Admitting: General Surgery

## 2013-10-20 ENCOUNTER — Encounter: Payer: Self-pay | Admitting: Cardiology

## 2013-10-20 ENCOUNTER — Ambulatory Visit (INDEPENDENT_AMBULATORY_CARE_PROVIDER_SITE_OTHER): Payer: Medicare Other | Admitting: Cardiology

## 2013-10-20 VITALS — BP 136/80 | HR 80 | Ht 66.0 in | Wt 154.8 lb

## 2013-10-20 DIAGNOSIS — I10 Essential (primary) hypertension: Secondary | ICD-10-CM

## 2013-10-20 DIAGNOSIS — R0602 Shortness of breath: Secondary | ICD-10-CM

## 2013-10-20 DIAGNOSIS — R002 Palpitations: Secondary | ICD-10-CM | POA: Insufficient documentation

## 2013-10-20 NOTE — Progress Notes (Signed)
[place], [address], [place]  [address] Phone: [phone] Fax:  [phone]  Date:  10/20/2013   ID:  Phillip Hill, DOB 02-Aug-1940, MRN [id number]  PCP:  Elizabeth Palau, MD  Cardiologist:  Fransico Him, MD     History of Present Illness: Phillip Hill is a 73 y.o. male with a history of HTN, dyslipidemia and seizures who presents today for evaluation of irregular heart beat.  He intermittently notices that his heart beats irregularly but that seems to have improved recently.  He denies any chest pain or pressure, LE edema, dizziness or syncope.  He notices that when he gets the irregular heart beat that his breathing will change.  When the irregular heart beat is not present he does not notice the SOB.   Wt Readings from Last 3 Encounters:  10/20/13 154 lb 12.8 oz (70.217 kg)  07/02/12 156 lb (70.761 kg)  04/27/12 165 lb (74.844 kg)     Past Medical History  Diagnosis Date  . Hemorrhoids   . Arthritis   . Sleep disorder   . Seizures     in control  . Hypertension   . Idiopathic osteoporosis   . High blood pressure   . Other and unspecified hyperlipidemia   . Upper limb amputation, shoulder   . Essential and other specified forms of tremor   . Herpes     In control    Current Outpatient Prescriptions  Medication Sig Dispense Refill  . hydrALAZINE (APRESOLINE) 25 MG tablet Take 25 mg by mouth 2 (two) times daily.      . metoprolol (LOPRESSOR) 50 MG tablet Take 50 mg by mouth daily.      . phenytoin (DILANTIN) 100 MG ER capsule Take 3 capsules (300 mg total) by mouth at bedtime.  100 capsule  9  . pravastatin (PRAVACHOL) 20 MG tablet Take 20 mg by mouth daily.      . valACYclovir (VALTREX) 500 MG tablet Take 500 mg by mouth daily.       No current facility-administered medications for this visit.    Allergies:    Allergies  Allergen Reactions  . Penicillins Itching    Social History:  The patient  reports that he quit smoking about 45 years ago. His  smoking use included Cigarettes. He smoked 0.00 packs per day. He has never used smokeless tobacco. He reports that he does not drink alcohol or use illicit drugs.   Family History:  The patient's family history includes Cancer in his brother, mother, and sister.   ROS:  Please see the history of present illness.      All other systems reviewed and negative.   PHYSICAL EXAM: VS:  BP 136/80  Pulse 80  Ht 5\' 6"  (1.676 m)  Wt 154 lb 12.8 oz (70.217 kg)  BMI 25.00 kg/m2 Well nourished, well developed, in no acute distress HEENT: normal Neck: no JVD Cardiac:  normal S1, S2; RRR; no murmur Lungs:  clear to auscultation bilaterally, no wheezing, rhonchi or rales Abd: soft, nontender, no hepatomegaly Ext: no edema Skin: warm and dry Neuro:  CNs 2-12 intact, no focal abnormalities noted  EKG:  NSR with PAC's     ASSESSMENT AND PLAN:  1. Irregular heart beat with PAC's on EKG - will get a 30 day event monitor to assess for any other arrhythmias 2. HTN well controlled 3. SOB only with arrhythmia - 2D echo to assess LVF Signed, Fransico Him, MD 10/20/2013 8:01 AM

## 2013-10-20 NOTE — Patient Instructions (Signed)
Your physician recommends that you continue on your current medications as directed. Please refer to the Current Medication list given to you today.  Your physician has requested that you have an echocardiogram. Echocardiography is a painless test that uses sound waves to create images of your heart. It provides your doctor with information about the size and shape of your heart and how well your heart's chambers and valves are working. This procedure takes approximately one hour. There are no restrictions for this procedure.  Your physician has recommended that you wear an event monitor. Event monitors are medical devices that record the heart's electrical activity. Doctors most often [place] these monitors to diagnose arrhythmias. Arrhythmias are problems with the speed or rhythm of the heartbeat. The monitor is a small, portable device. You can wear one while you do your normal daily activities. This is usually used to diagnose what is causing palpitations/syncope (passing out).  Your physician recommends that you schedule a follow-up appointment As Needed with Dr Radford Pax

## 2013-10-21 ENCOUNTER — Encounter (INDEPENDENT_AMBULATORY_CARE_PROVIDER_SITE_OTHER): Payer: Medicare Other

## 2013-10-21 ENCOUNTER — Encounter: Payer: Self-pay | Admitting: *Deleted

## 2013-10-21 DIAGNOSIS — R002 Palpitations: Secondary | ICD-10-CM

## 2013-10-21 NOTE — Progress Notes (Signed)
Patient ID: Phillip Hill, male   DOB: [birth year], 73 y.o.   MRN: [id number] [employer] 30 day cardiac event monitor applied to patient.

## 2013-10-25 ENCOUNTER — Other Ambulatory Visit: Payer: Self-pay | Admitting: Neurology

## 2013-10-28 ENCOUNTER — Ambulatory Visit (HOSPITAL_COMMUNITY): Payer: Medicare Other | Attending: Cardiology | Admitting: Radiology

## 2013-10-28 DIAGNOSIS — R0602 Shortness of breath: Secondary | ICD-10-CM | POA: Diagnosis present

## 2013-10-28 NOTE — Progress Notes (Signed)
Echocardiogram performed.  

## 2013-11-03 ENCOUNTER — Other Ambulatory Visit: Payer: Self-pay | Admitting: General Surgery

## 2013-11-03 DIAGNOSIS — I77810 Thoracic aortic ectasia: Secondary | ICD-10-CM

## 2013-11-11 ENCOUNTER — Encounter: Payer: Self-pay | Admitting: *Deleted

## 2013-11-11 NOTE — Progress Notes (Signed)
Patient ID: Phillip Hill, male   DOB: 1940-09-09, 73 y.o.   MRN: [id number] Received notification from [facility] patient cardiac event monitor had not transmitted in 72 hours. Several attempts to contact the patient have been made.  On 11/11/2013, I was able to reach Phillip Hill by telephone.  He had not been wearing his monitor because it made red spots on him.  He "figured",  He did not have to wear it because he already had his echo done.  I asked the patient to please put his monitor on for the duration expected end of service 11/21/13), and to please call [employer] to verify it is working correctly.

## 2013-11-26 ENCOUNTER — Telehealth: Payer: Self-pay | Admitting: Cardiology

## 2013-11-26 NOTE — Telephone Encounter (Signed)
Please let patient know that heart monitor showed NSR with occasional extra heart beats from the bottom of the heart which are benign

## 2013-11-26 NOTE — Telephone Encounter (Signed)
Pt is aware.  

## 2013-11-29 ENCOUNTER — Other Ambulatory Visit: Payer: Self-pay | Admitting: Neurology

## 2013-11-30 ENCOUNTER — Telehealth: Payer: Self-pay | Admitting: Neurology

## 2013-11-30 NOTE — Telephone Encounter (Signed)
Butch Penny: Please let patient's known, his last clinical visit with [place] was April 2014, if he wants to continue and refill his medication with [place], he should come in for visit at least once a year, from reviewing the record, he has been doing very well, if he wants to continue refill of Dilantin with his primary care, it is understandable too  Per record, he was given prescription of Dilantin 30 tablets November 29 2013

## 2013-11-30 NOTE — Telephone Encounter (Signed)
Called patient back and he stated that Dr.Yan only wrote his Dilantin for 10 days. Patient states he does not come back and see Dr.Yan until next October.

## 2013-11-30 NOTE — Telephone Encounter (Signed)
Patient is waiting for a call back from Dr. Krista Blue on why he only received 10 tablets of Dilantin and not the 30 days. He said his instructions at last visit were to call back in October to make an appointment and it is not October yet.

## 2013-11-30 NOTE — Telephone Encounter (Signed)
Called patient and left him a message stating he needs to be scheduled for appt. Please connect me with him when he calls in.

## 2013-11-30 NOTE — Telephone Encounter (Signed)
Patient called back and he scheduled with Dr.Yan 12-02-2013. Patient understood and he was fine with this time.

## 2013-12-02 ENCOUNTER — Encounter: Payer: Self-pay | Admitting: Neurology

## 2013-12-02 ENCOUNTER — Ambulatory Visit (INDEPENDENT_AMBULATORY_CARE_PROVIDER_SITE_OTHER): Payer: Medicare Other | Admitting: Neurology

## 2013-12-02 VITALS — BP 134/80 | HR 67 | Ht 65.0 in | Wt 154.5 lb

## 2013-12-02 DIAGNOSIS — R569 Unspecified convulsions: Secondary | ICD-10-CM

## 2013-12-02 MED ORDER — PHENYTOIN SODIUM EXTENDED 100 MG PO CAPS: 300.0000 mg | ORAL_CAPSULE | Freq: Every day | ORAL | Status: DC
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
## 2014-01-04 ENCOUNTER — Other Ambulatory Visit: Payer: Self-pay

## 2014-01-04 MED ORDER — PHENYTOIN SODIUM EXTENDED 100 MG PO CAPS: 300.0000 mg | ORAL_CAPSULE | Freq: Every day | ORAL | Status: DC

## 2014-01-04 NOTE — Telephone Encounter (Signed)
Pharmacy says patient requests 100 day supply per fill.

## 2014-06-14 ENCOUNTER — Other Ambulatory Visit: Payer: Self-pay | Admitting: Urology

## 2014-06-22 ENCOUNTER — Encounter (HOSPITAL_BASED_OUTPATIENT_CLINIC_OR_DEPARTMENT_OTHER): Payer: Self-pay | Admitting: *Deleted

## 2014-06-22 NOTE — Progress Notes (Signed)
NPO AFTER MN. ARRIVE AT 0730. NEEDS ISTAT . CURRENT EKG IN CHART AND EPIC. WILL TAKE AM MEDS DOS W/ SIPS OF WATER.

## 2014-06-28 ENCOUNTER — Ambulatory Visit (HOSPITAL_BASED_OUTPATIENT_CLINIC_OR_DEPARTMENT_OTHER): Payer: Medicare Other | Admitting: Anesthesiology

## 2014-06-28 ENCOUNTER — Encounter (HOSPITAL_BASED_OUTPATIENT_CLINIC_OR_DEPARTMENT_OTHER): Payer: Self-pay | Admitting: *Deleted

## 2014-06-28 ENCOUNTER — Encounter (HOSPITAL_BASED_OUTPATIENT_CLINIC_OR_DEPARTMENT_OTHER)
Admission: RE | Disposition: A | Discharge status: Home / Self Care | Payer: Self-pay | Source: Ambulatory Visit | Attending: Urology

## 2014-06-28 ENCOUNTER — Ambulatory Visit (HOSPITAL_BASED_OUTPATIENT_CLINIC_OR_DEPARTMENT_OTHER)
Admission: RE | Admit: 2014-06-28 | Discharge: 2014-06-28 | Disposition: A | Discharge status: Home / Self Care | Payer: Medicare Other | Source: Ambulatory Visit | Attending: Urology | Admitting: Urology

## 2014-06-28 DIAGNOSIS — Z89239 Acquired absence of unspecified shoulder: Secondary | ICD-10-CM | POA: Insufficient documentation

## 2014-06-28 DIAGNOSIS — R569 Unspecified convulsions: Secondary | ICD-10-CM | POA: Diagnosis not present

## 2014-06-28 DIAGNOSIS — K649 Unspecified hemorrhoids: Secondary | ICD-10-CM | POA: Diagnosis not present

## 2014-06-28 DIAGNOSIS — Z87891 Personal history of nicotine dependence: Secondary | ICD-10-CM | POA: Insufficient documentation

## 2014-06-28 DIAGNOSIS — I10 Essential (primary) hypertension: Secondary | ICD-10-CM | POA: Diagnosis not present

## 2014-06-28 DIAGNOSIS — N3020 Other chronic cystitis without hematuria: Secondary | ICD-10-CM | POA: Diagnosis present

## 2014-06-28 DIAGNOSIS — D62 Acute posthemorrhagic anemia: Secondary | ICD-10-CM | POA: Insufficient documentation

## 2014-06-28 DIAGNOSIS — N3021 Other chronic cystitis with hematuria: Secondary | ICD-10-CM | POA: Diagnosis not present

## 2014-06-28 DIAGNOSIS — M1990 Unspecified osteoarthritis, unspecified site: Secondary | ICD-10-CM | POA: Diagnosis not present

## 2014-06-28 HISTORY — DX: Nocturia: R35.1

## 2014-06-28 HISTORY — DX: Other chronic cystitis without hematuria: N30.20

## 2014-06-28 HISTORY — DX: Urgency of urination: R39.15

## 2014-06-28 HISTORY — DX: Atrial premature depolarization: I49.1

## 2014-06-28 HISTORY — DX: Personal history of other diseases of the digestive system: Z87.19

## 2014-06-28 HISTORY — DX: Diverticulosis of large intestine without perforation or abscess without bleeding: K57.30

## 2014-06-28 HISTORY — PX: CYSTOSCOPY WITH BIOPSY: SHX5122

## 2014-06-28 HISTORY — DX: Unspecified amblyopia, left eye: H53.002

## 2014-06-28 HISTORY — DX: Personal history of other infectious and parasitic diseases: Z86.19

## 2014-06-28 HISTORY — DX: Localization-related (focal) (partial) symptomatic epilepsy and epileptic syndromes with complex partial seizures, not intractable, without status epilepticus: G40.209

## 2014-06-28 HISTORY — DX: Frequency of micturition: R35.0

## 2014-06-28 LAB — POCT I-STAT 4, (NA,K, GLUC, HGB,HCT)
Glucose, Bld: 106 mg/dL — ABNORMAL HIGH (ref 70–99)
HCT: 46 % (ref 39.0–52.0)
Hemoglobin: 15.6 g/dL (ref 13.0–17.0)
Potassium: 4.4 mmol/L (ref 3.5–5.1)
Sodium: 140 mmol/L (ref 135–145)

## 2014-06-28 SURGERY — CYSTOSCOPY, WITH BIOPSY
Anesthesia: General | Site: Bladder

## 2014-06-28 MED ORDER — STERILE WATER FOR IRRIGATION IR SOLN
Status: DC | PRN
  Administered 2014-06-28: 3000 mL

## 2014-06-28 MED ORDER — DEXAMETHASONE SODIUM PHOSPHATE 4 MG/ML IJ SOLN
INTRAMUSCULAR | Status: DC | PRN
  Administered 2014-06-28: 8 mg via INTRAVENOUS

## 2014-06-28 MED ORDER — MEPERIDINE HCL 25 MG/ML IJ SOLN
12.5000 mg | Freq: Once | INTRAMUSCULAR | Status: DC
  Filled 2014-06-28: qty 1

## 2014-06-28 MED ORDER — MIDAZOLAM HCL 2 MG/2ML IJ SOLN
INTRAMUSCULAR | Status: AC
  Filled 2014-06-28: qty 2

## 2014-06-28 MED ORDER — HYDROMORPHONE HCL 1 MG/ML IJ SOLN
0.2500 mg | INTRAMUSCULAR | Status: DC | PRN
  Filled 2014-06-28: qty 1

## 2014-06-28 MED ORDER — ONDANSETRON HCL 4 MG/2ML IJ SOLN
INTRAMUSCULAR | Status: DC | PRN
  Administered 2014-06-28: 4 mg via INTRAVENOUS

## 2014-06-28 MED ORDER — PROPOFOL 10 MG/ML IV BOLUS
INTRAVENOUS | Status: DC | PRN
  Administered 2014-06-28: 150 mg via INTRAVENOUS

## 2014-06-28 MED ORDER — CIPROFLOXACIN IN D5W 400 MG/200ML IV SOLN
400.0000 mg | INTRAVENOUS | Status: DC
  Filled 2014-06-28: qty 200

## 2014-06-28 MED ORDER — FENTANYL CITRATE 0.05 MG/ML IJ SOLN
INTRAMUSCULAR | Status: AC
Start: 2014-06-28 — End: 2014-06-28
  Filled 2014-06-28: qty 6

## 2014-06-28 MED ORDER — LACTATED RINGERS IV SOLN
INTRAVENOUS | Status: DC
  Administered 2014-06-28 (×2): via INTRAVENOUS
  Filled 2014-06-28: qty 1000

## 2014-06-28 MED ORDER — FENTANYL CITRATE 0.05 MG/ML IJ SOLN
INTRAMUSCULAR | Status: DC | PRN
  Administered 2014-06-28: 50 ug via INTRAVENOUS
  Administered 2014-06-28 (×2): 25 ug via INTRAVENOUS

## 2014-06-28 MED ORDER — PROMETHAZINE HCL 25 MG/ML IJ SOLN
INTRAMUSCULAR | Status: AC
  Filled 2014-06-28: qty 1

## 2014-06-28 MED ORDER — VANCOMYCIN HCL IN DEXTROSE 1-5 GM/200ML-% IV SOLN
1000.0000 mg | Freq: Once | INTRAVENOUS | Status: AC
  Administered 2014-06-28: 1000 mg via INTRAVENOUS
  Filled 2014-06-28: qty 200

## 2014-06-28 MED ORDER — MEPERIDINE HCL 25 MG/ML IJ SOLN
INTRAMUSCULAR | Status: AC
  Filled 2014-06-28: qty 0

## 2014-06-28 MED ORDER — PROMETHAZINE HCL 25 MG/ML IJ SOLN
6.2500 mg | Freq: Once | INTRAMUSCULAR | Status: AC
Start: 2014-06-28 — End: 2014-06-28
  Administered 2014-06-28: 6.25 mg via INTRAVENOUS
  Filled 2014-06-28: qty 1

## 2014-06-28 MED ORDER — LIDOCAINE HCL (CARDIAC) 20 MG/ML IV SOLN
INTRAVENOUS | Status: DC | PRN
  Administered 2014-06-28: 60 mg via INTRAVENOUS

## 2014-06-28 SURGICAL SUPPLY — 22 items
BAG DRAIN URO-CYSTO SKYTR STRL (DRAIN) ×3 IMPLANT
CANISTER SUCT LVC 12 LTR MEDI- (MISCELLANEOUS) ×3 IMPLANT
CLOTH BEACON ORANGE TIMEOUT ST (SAFETY) ×3 IMPLANT
ELECT REM PT RETURN 9FT ADLT (ELECTROSURGICAL) ×3
ELECTRODE REM PT RTRN 9FT ADLT (ELECTROSURGICAL) ×1 IMPLANT
GLOVE BIO SURGEON STRL SZ7 (GLOVE) ×3 IMPLANT
GLOVE BIO SURGEON STRL SZ7.5 (GLOVE) ×6 IMPLANT
GLOVE BIOGEL PI IND STRL 7.5 (GLOVE) ×2 IMPLANT
GLOVE BIOGEL PI INDICATOR 7.5 (GLOVE) ×4
GLOVE INDICATOR 7.5 STRL GRN (GLOVE) ×6 IMPLANT
GLOVE SURG SS PI 7.5 STRL IVOR (GLOVE) ×6 IMPLANT
GOWN STRL REUS W/ TWL LRG LVL3 (GOWN DISPOSABLE) ×1 IMPLANT
GOWN STRL REUS W/ TWL XL LVL3 (GOWN DISPOSABLE) ×2 IMPLANT
GOWN STRL REUS W/TWL LRG LVL3 (GOWN DISPOSABLE) ×2
GOWN STRL REUS W/TWL XL LVL3 (GOWN DISPOSABLE) ×4
NDL SAFETY ECLIPSE 18X1.5 (NEEDLE) IMPLANT
NEEDLE HYPO 18GX1.5 SHARP (NEEDLE)
NEEDLE HYPO 22GX1.5 SAFETY (NEEDLE) IMPLANT
NS IRRIG 500ML POUR BTL (IV SOLUTION) IMPLANT
PACK CYSTO (CUSTOM PROCEDURE TRAY) ×3 IMPLANT
SYR 20CC LL (SYRINGE) IMPLANT
WATER STERILE IRR 3000ML UROMA (IV SOLUTION) ×3 IMPLANT

## 2014-06-28 NOTE — Discharge Instructions (Signed)
CYSTOSCOPY HOME CARE INSTRUCTIONS ° °Activity: °Rest for the remainder of the day.  Do not drive or operate equipment today.  You may resume normal activities in one to two days as instructed by your physician.  ° °Meals: °Drink plenty of liquids and eat light foods such as gelatin or soup this evening.  You may return to a normal meal plan tomorrow. ° °Return to Work: °You may return to work in one to two days or as instructed by your physician. ° °Special Instructions / Symptoms: °Call your physician if any of these symptoms occur: ° ° -persistent or heavy bleeding ° -bleeding which continues after first few urination ° -large blood clots that are difficult to pass ° -urine stream diminishes or stops completely ° -fever equal to or higher than 101 degrees Farenheit. ° -cloudy urine with a strong, foul odor ° -severe pain ° ° You may feel some burning pain when you urinate.  This should disappear with time.  Applying moist heat to the lower abdomen or a hot tub bath may help relieve the pain. \ ° ° ° ° °Post Anesthesia Home Care Instructions ° °Activity: °Get plenty of rest for the remainder of the day. A responsible adult should stay with you for 24 hours following the procedure.  °For the next 24 hours, DO NOT: °-Drive a car °-Operate machinery °-Drink alcoholic beverages °-Take any medication unless instructed by your physician °-Make any legal decisions or sign important papers. ° °Meals: °Start with liquid foods such as gelatin or soup. Progress to regular foods as tolerated. Avoid greasy, spicy, heavy foods. If nausea and/or vomiting occur, drink only clear liquids until the nausea and/or vomiting subsides. Call your physician if vomiting continues. ° °Special Instructions/Symptoms: °Your throat may feel dry or sore from the anesthesia or the breathing tube placed in your throat during surgery. If this causes discomfort, gargle with warm salt water. The discomfort should disappear within 24 hours. ° °If you  had a scopolamine patch placed behind your ear for the management of post- operative nausea and/or vomiting: ° °1. The medication in the patch is effective for 72 hours, after which it should be removed.  Wrap patch in a tissue and discard in the trash. Wash hands thoroughly with soap and water. °2. You may remove the patch earlier than 72 hours if you experience unpleasant side effects which may include dry mouth, dizziness or visual disturbances. °3. Avoid touching the patch. Wash your hands with soap and water after contact with the patch. °  ° °

## 2014-06-28 NOTE — Anesthesia Postprocedure Evaluation (Signed)
  Anesthesia Post-op Note  Patient: Phillip Hill  Procedure(s) Performed: Procedure(s): CYSTOSCOPY WITH BIOPSY (N/A)  Patient Location: PACU  Anesthesia Type:General  Level of Consciousness: awake and alert   Airway and Oxygen Therapy: Patient Spontanous Breathing  Post-op Pain: none  Post-op Assessment: Post-op Vital signs reviewed, Patient's Cardiovascular Status Stable and Respiratory Function Stable  Post-op Vital Signs: Reviewed  Filed Vitals:   06/28/14 1025  BP: 186/82  Pulse: 70  Temp:   Resp: 17    Complications: No apparent anesthesia complications

## 2014-06-28 NOTE — Transfer of Care (Signed)
Immediate Anesthesia Transfer of Care Note  Patient: Phillip Hill  Procedure(s) Performed: Procedure(s) (LRB): CYSTOSCOPY WITH BIOPSY (N/A)  Patient Location: PACU  Anesthesia Type: General  Level of Consciousness: awake, oriented, sedated and patient cooperative  Airway & Oxygen Therapy: Patient Spontanous Breathing and Patient connected to face mask oxygen  Post-op Assessment: Report given to PACU RN and Post -op Vital signs reviewed and stable  Post vital signs: Reviewed and stable  Complications: No apparent anesthesia complications

## 2014-06-28 NOTE — Anesthesia Preprocedure Evaluation (Addendum)
Anesthesia Evaluation  Patient identified by MRN, date of birth, ID band Patient awake    Reviewed: Allergy & Precautions, H&P , NPO status , Patient's Chart, lab work & pertinent test results, reviewed documented beta blocker date and time   Airway Mallampati: II  TM Distance: >3 FB Neck ROM: Full    Dental no notable dental hx. (+) Edentulous Upper, Edentulous Lower, Dental Advisory Given   Pulmonary neg pulmonary ROS, former smoker,  breath sounds clear to auscultation  Pulmonary exam normal       Cardiovascular hypertension, Pt. on medications and Pt. on home beta blockers Rhythm:Regular Rate:Normal     Neuro/Psych Seizures -, Well Controlled,  negative psych ROS   GI/Hepatic negative GI ROS, Neg liver ROS,   Endo/Other  negative endocrine ROS  Renal/GU negative Renal ROS  negative genitourinary   Musculoskeletal  (+) Arthritis -, Osteoarthritis,    Abdominal   Peds  Hematology negative hematology ROS (+)   Anesthesia Other Findings   Reproductive/Obstetrics negative OB ROS                            Anesthesia Physical Anesthesia Plan  ASA: II  Anesthesia Plan: General   Post-op Pain Management:    Induction: Intravenous  Airway Management Planned: LMA  Additional Equipment:   Intra-op Plan:   Post-operative Plan: Extubation in OR  Informed Consent: I have reviewed the patients History and Physical, chart, labs and discussed the procedure including the risks, benefits and alternatives for the proposed anesthesia with the patient or authorized representative who has indicated his/her understanding and acceptance.   Dental advisory given  Plan Discussed with: CRNA  Anesthesia Plan Comments:         Anesthesia Quick Evaluation

## 2014-06-28 NOTE — Op Note (Signed)
Phillip Hill is a 74 y.o.   06/28/2014  General  Preop diagnosis: Chronic cystitis, rule out carcinoma in situ  Postop diagnosis: Same  Procedure done: Cystoscopy, random bladder biopsy  Surgeon: Charlene Brooke. Marx Doig  Anesthesia: Gen.  Indication: Patient is a 74 years old male who has a history of hematuria. CT scan in September 2015 was unremarkable. Cystoscopy showed multiple brownish lesions in the bladder suggestive of cystitis cystica. He is scheduled today for bladder biopsy  Procedure: Patient was identified by his wrist band and proper timeout was taken.  Under general anesthesia he was prepped and draped and placed in the dorsolithotomy position. A panendoscope was inserted in the bladder. The anterior urethra is normal. He has trilobar prostatic hypertrophy. The bladder is trabeculated. There are several brownish lesions in the bladder suggestive of cystitis cystica. The bladder mucosa is also reddened. There is no stone in the bladder. The ureteral orifices are in normal position and shape. Random bladder biopsy was done with a biopsy forceps. Hemostasis was also secured with the forceps. There was no evidence of bleeding at the end of the procedure. The bladder was emptied and cystoscope removed.  Patient tolerated the procedure well and left the OR in satisfactory condition to postanesthesia care unit.  EBL: Minimal

## 2014-06-28 NOTE — Anesthesia Procedure Notes (Signed)
Procedure Name: LMA Insertion Date/Time: 06/28/2014 9:10 AM Performed by: Denna Haggard D Pre-anesthesia Checklist: Patient identified, Emergency Drugs available, Suction available and Patient being monitored Patient Re-evaluated:Patient Re-evaluated prior to inductionOxygen Delivery Method: Circle System Utilized Preoxygenation: Pre-oxygenation with 100% oxygen Intubation Type: IV induction Ventilation: Mask ventilation without difficulty LMA: LMA inserted LMA Size: 5.0 Number of attempts: 1 Airway Equipment and Method: Bite block Placement Confirmation: positive ETCO2 Tube secured with: Tape Dental Injury: Teeth and Oropharynx as per pre-operative assessment

## 2014-06-28 NOTE — H&P (Signed)
History of Present Illness Phillip Hill returns for follow-up. His PSA went down to 2.85 in September 2015. He has a history of hematuria. CT scan in September 2015 was unremarkable. He has not had a cystoscopy yet. Cystoscopy today shows multiple brownish lesions in the bladder suggestive of cystitis cystica. Urinalysis shows 0-2 RBCs, 3-6 WBCs, few bacteria. He has not smoked in 46 years.   Past Medical History Problems  1. History of hypertension (Z86.79) 2. History of Seizure  Surgical History Problems  1. History of Amputation Of Arm 2. History of Inguinal Hernia Repair  Current Meds 1. AmLODIPine Besylate 5 MG Oral Tablet;  Therapy: (Recorded:11Feb2015) to Recorded 2. Dilantin 100 MG Oral Capsule;  Therapy: (Recorded:11Feb2015) to Recorded 3. Furosemide 20 MG Oral Tablet;  Therapy: (Recorded:11Feb2015) to Recorded 4. SMZ-TMP DS 800-160 MG Oral Tablet; TAKE 1 TABLET BID;  Therapy: [id number] to (Evaluate:17Aug2015)  Requested for: [phone]; Last  Rx:10Aug2015 Ordered 5. ValACYclovir HCl - 500 MG Oral Tablet;  Therapy: (Recorded:11Feb2015) to Recorded  Allergies Medication  1. Penicillins  Family History Problems  1. Family history of malignant neoplasm (Z80.9) : Mother   unspecified  Social History Problems  1. Denied: History of Alcohol use 2. Caffeine use (F15.90) 3. Father deceased   83 4. Former smoker [phone]) 5. History of smoking (Z87.898)   quit about 45 yrs ago 6. Mother deceased   72 cancer unspecified 38. Number of children   1 son/ 1 daughter  Review of Systems Genitourinary, constitutional, skin, eye, otolaryngeal, hematologic/lymphatic, cardiovascular, pulmonary, endocrine, musculoskeletal, gastrointestinal, neurological and psychiatric system(s) were reviewed and pertinent findings if present are noted and are otherwise negative.  Genitourinary: urinary frequency, feelings of urinary urgency and nocturia.    Physical Exam Constitutional:  Well nourished and well developed . No acute distress.  ENT:. The ears and nose are normal in appearance.  Neck: The appearance of the neck is normal and no neck mass is present.  Pulmonary: No respiratory distress and normal respiratory rhythm and effort.  Cardiovascular: Heart rate and rhythm are normal . No peripheral edema.  Abdomen: The abdomen is soft and nontender. No masses are palpated. No CVA tenderness. No hernias are palpable. No hepatosplenomegaly noted.  Rectal: Rectal exam demonstrates normal sphincter tone, no tenderness and no masses. The prostate has no nodularity and is not tender. The left seminal vesicle is nonpalpable. The right seminal vesicle is nonpalpable. The perineum is normal on inspection.  Genitourinary: Examination of the penis demonstrates no discharge, no masses, no lesions and a normal meatus. The scrotum is without lesions. The right epididymis is palpably normal and non-tender. The left epididymis is palpably normal and non-tender. The right testis is non-tender and without masses. The left testis is non-tender and without masses.  Lymphatics: The femoral and inguinal nodes are not enlarged or tender.  Skin: Normal skin turgor, no visible rash and no visible skin lesions.  Neuro/Psych:. Mood and affect are appropriate.    Results/Data Urine [Data Includes: Last 1 Day]   [id number]  COLOR YELLOW   APPEARANCE CLEAR   SPECIFIC GRAVITY <1.005   pH 5.5   GLUCOSE NEG mg/dL  BILIRUBIN NEG   KETONE NEG mg/dL  BLOOD NEG   PROTEIN NEG mg/dL  UROBILINOGEN 0.2 mg/dL  NITRITE NEG   LEUKOCYTE ESTERASE SMALL   SQUAMOUS EPITHELIAL/HPF RARE   WBC 3-6 WBC/hpf  RBC 0-2 RBC/hpf  BACTERIA FEW   CRYSTALS NONE SEEN   CASTS NONE SEEN    Procedure  Procedure:  Cystoscopy   Indication: Hematuria. Frequent UTI.  Informed Consent: Risks, benefits, and potential adverse events were discussed and informed consent was obtained from the patient . Specific risks including,  but not limited to bleeding, infection, pain, allergic reaction etc. were explained.  Prep: The patient was prepped with betadine.  Procedure Note:  Bladder: Visulization was clear. The ureteral orifices were in the normal anatomic position bilaterally. Examination of the bladder demonstrated erythematous mucosa. There are several brownish lesions at the bladder base suggestive of cystitis cystica.    Assessment Assessed  1. Microscopic hematuria (R31.2) 2. Urinary tract infection (N39.0) 3. Cystitis cystica (N30.80) 4. Benign localized prostatic hyperplasia with lower urinary tract symptoms (LUTS) (N40.1) 5. Elevated prostate specific antigen (PSA) (R97.2) 6. Nocturia (R35.1)  Plan Elevated prostate specific antigen (PSA)  1. PSA REFLEX TO FREE; Status:In Progress - Specimen/Data Collected;   Done:  [id number] 2. VENIPUNCTURE; Status:Complete;   Done: [id number] Health Maintenance  3. UA With REFLEX; [Do Not Release]; Status:Complete;   Done: 83MHD6222 08:13AM  He needs bladder biopsy for further evaluation of the bladder lesions. The procedure, risks, benefits were explained to the patient. The risks include but are not limited to hemorrhage, infection, bladder injury. He understands and wishes to proceed.

## 2014-06-29 ENCOUNTER — Encounter (HOSPITAL_BASED_OUTPATIENT_CLINIC_OR_DEPARTMENT_OTHER): Payer: Self-pay | Admitting: Urology

## 2015-01-18 NOTE — Telephone Encounter (Signed)
Error

## 2015-06-09 ENCOUNTER — Ambulatory Visit (INDEPENDENT_AMBULATORY_CARE_PROVIDER_SITE_OTHER): Payer: Medicare Other | Admitting: Internal Medicine

## 2015-06-09 ENCOUNTER — Encounter: Payer: Self-pay | Admitting: Internal Medicine

## 2015-06-09 VITALS — BP 147/79 | HR 74 | Temp 98.3°F | Wt 154.0 lb

## 2015-06-09 DIAGNOSIS — I10 Essential (primary) hypertension: Secondary | ICD-10-CM | POA: Diagnosis present

## 2015-06-09 DIAGNOSIS — B009 Herpesviral infection, unspecified: Secondary | ICD-10-CM

## 2015-06-09 DIAGNOSIS — Z7189 Other specified counseling: Secondary | ICD-10-CM | POA: Diagnosis not present

## 2015-06-09 DIAGNOSIS — Z7689 Persons encountering health services in other specified circumstances: Secondary | ICD-10-CM

## 2015-06-09 DIAGNOSIS — G40909 Epilepsy, unspecified, not intractable, without status epilepticus: Secondary | ICD-10-CM

## 2015-06-09 MED ORDER — HYDRALAZINE HCL 25 MG PO TABS: 25.0000 mg | ORAL_TABLET | Freq: Two times a day (BID) | ORAL | Status: DC

## 2015-06-09 MED ORDER — METOPROLOL TARTRATE 50 MG PO TABS: 50.0000 mg | ORAL_TABLET | Freq: Every morning | ORAL | Status: DC

## 2015-06-09 MED ORDER — VALACYCLOVIR HCL 500 MG PO TABS: 500.0000 mg | ORAL_TABLET | Freq: Every day | ORAL | Status: DC

## 2015-06-09 NOTE — Assessment & Plan Note (Signed)
Well-controlled on Valtrex for the last 10 years. - Refill for Valtrex 500mg  daily - Follow-up in 3 months

## 2015-06-09 NOTE — Assessment & Plan Note (Signed)
Well-controlled. BP 147/79 today. Goal for age <160/<90. Tolerating current regimen with no side effects. - Continue current regimen: Hydralazine 25mg  bid, Lopressor 50mg  daily - Follow-up in 3 months

## 2015-06-09 NOTE — Assessment & Plan Note (Signed)
Has not had a seizure in 20 years. Used to follow with Neurology, but they said he does not need to follow with them anymore. - Continue Dilantin 300mg  qhs - Follow-up in 3 months

## 2015-06-09 NOTE — Patient Instructions (Addendum)
It was so nice to meet you today! Welcome to our clinic.  You are very healthy! Your blood pressure is well-controlled today. We would like to keep your blood pressures below 150/90.  I have sent in a refill for your Metoprolol, Hydralazine, and Valtrex. You should be able to pick these up today. Please call our office at [phone] when you need a refill of your Dilantin.  We will see you back in 3 months.  Take care, Dr. Brett Albino
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
## 2015-07-31 DIAGNOSIS — Z0000 Encounter for general adult medical examination without abnormal findings: Secondary | ICD-10-CM | POA: Diagnosis not present

## 2015-07-31 DIAGNOSIS — N528 Other male erectile dysfunction: Secondary | ICD-10-CM | POA: Diagnosis not present

## 2015-07-31 DIAGNOSIS — N401 Enlarged prostate with lower urinary tract symptoms: Secondary | ICD-10-CM | POA: Diagnosis not present

## 2015-08-22 ENCOUNTER — Other Ambulatory Visit: Payer: Self-pay | Admitting: Internal Medicine

## 2015-08-22 ENCOUNTER — Telehealth: Payer: Self-pay | Admitting: *Deleted

## 2015-08-23 ENCOUNTER — Other Ambulatory Visit: Payer: Self-pay | Admitting: *Deleted

## 2015-08-23 DIAGNOSIS — I10 Essential (primary) hypertension: Secondary | ICD-10-CM

## 2015-08-23 MED ORDER — HYDRALAZINE HCL 25 MG PO TABS: 25.0000 mg | ORAL_TABLET | Freq: Two times a day (BID) | ORAL | Status: DC

## 2015-08-25 ENCOUNTER — Other Ambulatory Visit: Payer: Self-pay | Admitting: *Deleted

## 2015-08-25 MED ORDER — PHENYTOIN SODIUM EXTENDED 100 MG PO CAPS: 300.0000 mg | ORAL_CAPSULE | Freq: Every day | ORAL | Status: DC

## 2015-08-25 NOTE — Telephone Encounter (Signed)
Patient is aware that script has been called in. [facility]

## 2015-08-25 NOTE — Telephone Encounter (Signed)
Medication refilled

## 2015-08-25 NOTE — Telephone Encounter (Signed)
Daughter called and states that father only has 2 of his dilantin pills left.  She was unaware that he was this low and was unable to refill his script that is on file with the pharmacy since the provider who wrote it is deceased.  She would like to know if patient can get this refilled by his new pcp before the weekend.  Advised her that I would inform the provider and then we would call the patient and let him know that the script was sent to the pharmacy. Jazmin Hartsell,CMA

## 2015-09-04 ENCOUNTER — Other Ambulatory Visit: Payer: Self-pay | Admitting: Internal Medicine

## 2015-09-07 ENCOUNTER — Encounter: Payer: Self-pay | Admitting: Internal Medicine

## 2015-09-07 ENCOUNTER — Ambulatory Visit (INDEPENDENT_AMBULATORY_CARE_PROVIDER_SITE_OTHER): Payer: Medicare Other | Admitting: Internal Medicine

## 2015-09-07 VITALS — BP 120/68 | HR 72 | Temp 98.7°F | Ht 65.0 in | Wt 155.0 lb

## 2015-09-07 DIAGNOSIS — I10 Essential (primary) hypertension: Secondary | ICD-10-CM

## 2015-09-07 DIAGNOSIS — Z0000 Encounter for general adult medical examination without abnormal findings: Secondary | ICD-10-CM

## 2015-09-07 DIAGNOSIS — E785 Hyperlipidemia, unspecified: Secondary | ICD-10-CM | POA: Diagnosis not present

## 2015-09-07 MED ORDER — ASPIRIN EC 81 MG PO TBEC
81.0000 mg | DELAYED_RELEASE_TABLET | Freq: Every day | ORAL | Status: DC
Start: 2015-09-07 — End: 2015-09-07

## 2015-09-07 MED ORDER — ATORVASTATIN CALCIUM 40 MG PO TABS: 40.0000 mg | ORAL_TABLET | Freq: Every day | ORAL | Status: DC

## 2015-09-07 NOTE — Assessment & Plan Note (Signed)
Well-controlled. BP 142/77, 120/76 on re-check.  - Continue Hydralazine 25mg  bid and Lopressor 50mg  daily.  - Advised Pt to check his BPs at home, especially when he has a headache, and to write them down for next appointment. I would like to see what his blood pressures are running at home. - Follow-up in 3 months for hypertension follow-up.

## 2015-09-07 NOTE — Assessment & Plan Note (Addendum)
Last lipid panel in 2011: Chol 155, TG 82, HDL 45, LDL 94. Pt has never taken a statin. Had a long discussion today about decreasing his ASCVD risk score with aspirin and statin. His ASCVD risk was 25%. Pt has a history of diverticular bleed and was told to never take aspirin again. When discussing Lipitor, he states that he does not like taking a lot of medications. He also says he is an old man and he is not trying to live for many more years. - Will get a lipid panel today. - Pt has agreed to start Lipitor 40mg  daily if LDL is elevated. Will call to discuss results of lipid panel. - Will hold off on Aspirin. Do not believe that the benefits outweigh the risks of GI bleed. - Follow-up in 3 months.

## 2015-09-07 NOTE — Progress Notes (Signed)
   [facility] Phone: [phone]  Subjective:  Hypertension: Tolerating meds, no side effects. Takes his medications every day. Doesn't check his BP at home, but has a cuff. Endorses occasional headaches, denies blurry vision, denies chest pain, denies shortness of breath, denies lower extremity edema.   Dyslipidemia: Last lipid panel in 2011: Chol 155, TG 82, HDL 45, LDL 94. States he has never taken a statin before.  Health Care Maintenance: Overdue for vaccines- Tdap, Zostavax, and PCV-13  ROS: See HPI for pertinent positives and negatives Past Medical History- HTN, HSV-2, Dyslipidemia Reviewed problem list.  Medications- reviewed and updated Current Outpatient Prescriptions  Medication Sig Dispense Refill  . hydrALAZINE (APRESOLINE) 25 MG tablet Take 1 tablet (25 mg total) by mouth 2 (two) times daily. 60 tablet 2  . metoprolol (LOPRESSOR) 50 MG tablet TAKE 1 TABLET (50 MG TOTAL) BY MOUTH EVERY MORNING. 30 tablet 2  . phenytoin (DILANTIN) 100 MG ER capsule Take 3 capsules (300 mg total) by mouth at bedtime. 300 capsule 3  . valACYclovir (VALTREX) 500 MG tablet TAKE 1 TABLET (500 MG TOTAL) BY MOUTH DAILY. 30 tablet 2   No current facility-administered medications for this visit.   Chief complaint-noted Family history reviewed for today's visit. No changes. Social history- patient is a former smoker. Quit 40 years ago.  Objective: BP 142/77 mmHg  Pulse 72  Temp(Src) 98.7 F (37.1 C) (Oral)  Ht 5\' 5"  (1.651 m)  Wt 155 lb (70.308 kg)  BMI 25.79 kg/m2 Gen: NAD, alert, cooperative with exam, pleasant HEENT: NCAT, EOMI, MMM Neck: FROM, supple CV: RRR, no murmur Resp: CTABL, no wheezes, normal work of breathing Msk: No edema, warm, normal tone, moves UE/LE spontaneously Neuro: Alert and oriented, no gross deficits Skin: No rashes, no lesions Psych: Appropriate behavior  Assessment/Plan: Hypertension:  Well-controlled. BP 142/77, 120/76 on  re-check.  - Continue Hydralazine 25mg  bid and Lopressor 50mg  daily.  - Advised Pt to check his BPs at home, especially when he has a headache, and to write them down for next appointment. I would like to see what his blood pressures are running at home. - Follow-up in 3 months for hypertension follow-up.  Dyslipidemia: Last lipid panel in 2011: Chol 155, TG 82, HDL 45, LDL 94. Pt has never taken a statin. Had a long discussion today about decreasing his ASCVD risk score with aspirin and statin. His ASCVD risk was 25%. Pt has a history of diverticular bleed and was told to never take aspirin again. When discussing Lipitor, he states that he does not like taking a lot of medications. He also says he is an old man and he is not trying to live for many more years. - Will get a lipid panel today. - Pt has agreed to start Lipitor 40mg  daily if LDL is elevated. Will call to discuss results of lipid panel. - Will hold off on Aspirin. Do not believe that the benefits outweigh the risks of GI bleed. - Follow-up in 3 months.  Health Care Maintenance: - Encouraged Pt to have PCV-13, Zostavax, and Tdap performed at his pharmacy.   [name], MD PGY-1

## 2015-09-07 NOTE — Patient Instructions (Addendum)
It was so nice to see you!  I have ordered a lipid panel to be done today. We discussed that if your cholesterol level is high, you would be willing to start a medication called Lipitor. I will let you know the results of these labs.   You are also due for some vaccines, like the pneumonia vaccine, shingles vaccine, and Tdap vaccine. Please have these done at your pharmacy.  We will see you back in 3 months!  -Dr. Brett Albino

## 2015-09-07 NOTE — Assessment & Plan Note (Signed)
Encouraged Pt to have PCV-13, Zostavax, and Tdap performed at his pharmacy.

## 2015-09-08 LAB — LIPID PANEL
CHOL/HDL RATIO: 2.7 ratio (ref <=5.0)
CHOLESTEROL: 153 mg/dL (ref 125–200)
HDL: 57 mg/dL (ref >=40)
LDL Cholesterol: 75 mg/dL (ref <130)
TRIGLYCERIDES: 105 mg/dL (ref <150)
VLDL: 21 mg/dL (ref <30)

## 2015-09-21 ENCOUNTER — Telehealth: Payer: Self-pay | Admitting: *Deleted

## 2015-09-21 NOTE — Telephone Encounter (Signed)
Spoke with patient on the phone. His lipid panel looked really good. LDL 75. Pt does not need to take a statin at this time.  [name], MD PGY-1

## 2015-09-21 NOTE — Telephone Encounter (Signed)
Will forward to MD. Galit Urich,CMA  

## 2015-09-21 NOTE — Telephone Encounter (Signed)
Patient stopped taking cholesterol medicine due to severe pain in legs, worse around ankles. Wanted MD to be aware.

## 2015-10-07 ENCOUNTER — Emergency Department (HOSPITAL_COMMUNITY): Payer: Medicare Other

## 2015-10-07 ENCOUNTER — Encounter (HOSPITAL_COMMUNITY): Payer: Self-pay | Admitting: Emergency Medicine

## 2015-10-07 ENCOUNTER — Observation Stay (HOSPITAL_COMMUNITY): Payer: Medicare Other

## 2015-10-07 ENCOUNTER — Observation Stay (HOSPITAL_COMMUNITY)
Admission: EM | Admit: 2015-10-07 | Discharge: 2015-10-07 | Disposition: A | Discharge status: Home / Self Care | Payer: Medicare Other | Attending: General Surgery | Admitting: General Surgery

## 2015-10-07 DIAGNOSIS — R1084 Generalized abdominal pain: Secondary | ICD-10-CM | POA: Diagnosis not present

## 2015-10-07 DIAGNOSIS — K828 Other specified diseases of gallbladder: Secondary | ICD-10-CM | POA: Diagnosis not present

## 2015-10-07 DIAGNOSIS — K8020 Calculus of gallbladder without cholecystitis without obstruction: Secondary | ICD-10-CM | POA: Diagnosis not present

## 2015-10-07 DIAGNOSIS — G40209 Localization-related (focal) (partial) symptomatic epilepsy and epileptic syndromes with complex partial seizures, not intractable, without status epilepticus: Secondary | ICD-10-CM | POA: Diagnosis not present

## 2015-10-07 DIAGNOSIS — K819 Cholecystitis, unspecified: Secondary | ICD-10-CM

## 2015-10-07 DIAGNOSIS — I10 Essential (primary) hypertension: Secondary | ICD-10-CM | POA: Diagnosis not present

## 2015-10-07 DIAGNOSIS — Z79899 Other long term (current) drug therapy: Secondary | ICD-10-CM | POA: Diagnosis not present

## 2015-10-07 DIAGNOSIS — R1011 Right upper quadrant pain: Secondary | ICD-10-CM

## 2015-10-07 DIAGNOSIS — Z89201 Acquired absence of right upper limb, unspecified level: Secondary | ICD-10-CM | POA: Insufficient documentation

## 2015-10-07 DIAGNOSIS — Z87891 Personal history of nicotine dependence: Secondary | ICD-10-CM | POA: Diagnosis not present

## 2015-10-07 LAB — COMPREHENSIVE METABOLIC PANEL
ALT: 13 U/L — ABNORMAL LOW (ref 17–63)
AST: 16 U/L (ref 15–41)
Albumin: 3.6 g/dL (ref 3.5–5.0)
Alkaline Phosphatase: 111 U/L (ref 38–126)
Anion gap: 11 (ref 5–15)
BILIRUBIN TOTAL: 0.3 mg/dL (ref 0.3–1.2)
BUN: 9 mg/dL (ref 6–20)
CHLORIDE: 102 mmol/L (ref 101–111)
CO2: 26 mmol/L (ref 22–32)
Calcium: 9.6 mg/dL (ref 8.9–10.3)
Creatinine, Ser: 0.92 mg/dL (ref 0.61–1.24)
Glucose, Bld: 112 mg/dL — ABNORMAL HIGH (ref 65–99)
POTASSIUM: 3.7 mmol/L (ref 3.5–5.1)
Sodium: 139 mmol/L (ref 135–145)
TOTAL PROTEIN: 7 g/dL (ref 6.5–8.1)

## 2015-10-07 LAB — URINE MICROSCOPIC-ADD ON

## 2015-10-07 LAB — CBC
HCT: 39.8 % (ref 39.0–52.0)
Hemoglobin: 13.9 g/dL (ref 13.0–17.0)
MCH: 32.5 pg (ref 26.0–34.0)
MCHC: 34.9 g/dL (ref 30.0–36.0)
MCV: 93 fL (ref 78.0–100.0)
PLATELETS: 243 10*3/uL (ref 150–400)
RBC: 4.28 MIL/uL (ref 4.22–5.81)
RDW: 12.9 % (ref 11.5–15.5)
WBC: 6.3 10*3/uL (ref 4.0–10.5)

## 2015-10-07 LAB — URINALYSIS, ROUTINE W REFLEX MICROSCOPIC
BILIRUBIN URINE: NEGATIVE
Glucose, UA: NEGATIVE mg/dL
HGB URINE DIPSTICK: NEGATIVE
Ketones, ur: NEGATIVE mg/dL
Nitrite: NEGATIVE
PH: 6.5 (ref 5.0–8.0)
Protein, ur: NEGATIVE mg/dL
Specific Gravity, Urine: 1.015 (ref 1.005–1.030)

## 2015-10-07 LAB — LIPASE, BLOOD: LIPASE: 39 U/L (ref 11–51)

## 2015-10-07 MED ORDER — ONDANSETRON 4 MG PO TBDP
4.0000 mg | ORAL_TABLET | Freq: Four times a day (QID) | ORAL | Status: DC | PRN
Start: 2015-10-07 — End: 2015-10-07

## 2015-10-07 MED ORDER — KCL IN DEXTROSE-NACL 10-5-0.45 MEQ/L-%-% IV SOLN
INTRAVENOUS | Status: DC
  Filled 2015-10-07 (×2): qty 1000

## 2015-10-07 MED ORDER — ENOXAPARIN SODIUM 40 MG/0.4ML ~~LOC~~ SOLN: 40.0000 mg | SUBCUTANEOUS | Status: DC

## 2015-10-07 MED ORDER — MORPHINE SULFATE (PF) 4 MG/ML IV SOLN
2.8000 mg | Freq: Once | INTRAVENOUS | Status: AC
  Administered 2015-10-07: 2.8 mg via INTRAVENOUS
  Filled 2015-10-07: qty 1

## 2015-10-07 MED ORDER — METOPROLOL TARTRATE 25 MG PO TABS: 50.0000 mg | ORAL_TABLET | Freq: Every morning | ORAL | Status: DC

## 2015-10-07 MED ORDER — TRAMADOL HCL 50 MG PO TABS: 100.0000 mg | ORAL_TABLET | Freq: Three times a day (TID) | ORAL | Status: DC | PRN

## 2015-10-07 MED ORDER — ONDANSETRON HCL 4 MG/2ML IJ SOLN: 4.0000 mg | Freq: Four times a day (QID) | INTRAMUSCULAR | Status: DC | PRN

## 2015-10-07 MED ORDER — HYDROMORPHONE HCL 1 MG/ML IJ SOLN: 0.5000 mg | INTRAMUSCULAR | Status: DC | PRN

## 2015-10-07 MED ORDER — PHENYTOIN SODIUM EXTENDED 100 MG PO CAPS: 300.0000 mg | ORAL_CAPSULE | Freq: Every day | ORAL | Status: DC

## 2015-10-07 MED ORDER — ONDANSETRON 8 MG PO TBDP: 8.0000 mg | ORAL_TABLET | Freq: Three times a day (TID) | ORAL | Status: DC | PRN

## 2015-10-07 MED ORDER — HYDRALAZINE HCL 25 MG PO TABS: 25.0000 mg | ORAL_TABLET | Freq: Two times a day (BID) | ORAL | Status: DC

## 2015-10-07 MED ORDER — IOPAMIDOL (ISOVUE-300) INJECTION 61%
INTRAVENOUS | Status: AC
  Administered 2015-10-07: 100 mL
  Filled 2015-10-07: qty 100

## 2015-10-07 MED ORDER — FAMOTIDINE IN NACL 20-0.9 MG/50ML-% IV SOLN: 20.0000 mg | Freq: Two times a day (BID) | INTRAVENOUS | Status: DC

## 2015-10-07 MED ORDER — TECHNETIUM TC 99M MEBROFENIN IV KIT
5.5000 | PACK | Freq: Once | INTRAVENOUS | Status: AC | PRN
  Administered 2015-10-07: 6 via INTRAVENOUS

## 2015-10-07 NOTE — ED Notes (Signed)
Attempted report x1. 

## 2015-10-07 NOTE — ED Notes (Signed)
Dr. [name] at bedside now with pt and family member.

## 2015-10-07 NOTE — ED Provider Notes (Signed)
CSN: [id number]     Arrival date & time 10/07/15  0544 History   First MD Initiated Contact with Patient 10/07/15 [id number]     Chief Complaint  Patient presents with  . Abdominal Pain     (Consider location/radiation/quality/duration/timing/severity/associated sxs/prior Treatment) Patient is a 75 y.o. male presenting with abdominal pain. The history is provided by the patient and medical records.  Abdominal Pain   75 y.o. M with hx of hemorrhoids, PACs, diverticulosis, HTN, chronic cystitis, hx of GI bleed, presenting to the ED for abdominal pain.  Patient states he has had generalized abdominal pain intermittently for the past month or so.  He states this started after eating some eggs from [employer] that just "didnt look right".  He states when the pain comes it is generalized, aching in nature.  He denies any associated nausea, vomiting, or diarrhea.   No fever, chills, sweats.  Eating does not seem to make the pain worse in any way. States pain will usually resolve on its own.  States he does not have much of an appetite but he has been drinking fluids. Prior abdominal surgeries include inguinal hernia repair. Patient denies any pain currently.  VSS.  Past Medical History  Diagnosis Date  . Hemorrhoids   . Arthritis   . History of herpes simplex type 2 infection   . PAC (premature atrial contraction)   . Complex partial seizure disorder ([place])     began 1970's--  last seizure 1995  per neurologist note 10/ 2015--  dr Krista Blue  . History of lower GI bleeding     secondary to ASA use--  resolved  . Diverticulosis of colon   . Chronic cystitis   . Hypertension   . Frequency of urination   . Urgency of urination   . Nocturia   . Lazy eye of left side    Past Surgical History  Procedure Laterality Date  . 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 Colonoscopy  10/24/2011    Procedure: COLONOSCOPY;  Surgeon: Milus Banister, MD;  Location: [facility];  Service: Endoscopy;   Laterality: N/A;  . Inguinal hernia repair Left 09-15-2009  . Transthoracic echocardiogram  10-28-2013   dr Tressia Miners turner    mild focal basal hypertrophy of septum/  ef [id number]  grade I diastolic dysfunction/  mild AR/  trivial MR and TR  . Cystoscopy with biopsy N/A 06/28/2014    Procedure: CYSTOSCOPY WITH BIOPSY;  Surgeon: Lowella Bandy, MD;  Location: [facility];  Service: Urology;  Laterality: N/A;   Family History  Problem Relation Age of Onset  . Cancer Mother   . Cancer Sister   . Cancer Brother    Social History  Substance Use Topics  . Smoking status: Former Smoker    Types: Cigarettes    Quit date: 10/23/1968  . Smokeless tobacco: Never Used  . Alcohol Use: No     Comment: Quit alcohol in 1970    Review of Systems  Gastrointestinal: Positive for abdominal pain.  All other systems reviewed and are negative.     Allergies  Ciprofloxacin and Penicillins  Home Medications   Prior to Admission medications   Medication Sig Start Date End Date Taking? Authorizing Provider  hydrALAZINE (APRESOLINE) 25 MG tablet Take 1 tablet (25 mg total) by mouth 2 (two) times daily. 08/23/15   Sela Hua, MD  metoprolol (LOPRESSOR) 50 MG tablet TAKE 1 TABLET (50 MG TOTAL) BY MOUTH  EVERY MORNING. 08/22/15   Sela Hua, MD  phenytoin (DILANTIN) 100 MG ER capsule Take 3 capsules (300 mg total) by mouth at bedtime. 08/25/15   Sela Hua, MD  valACYclovir (VALTREX) 500 MG tablet TAKE 1 TABLET (500 MG TOTAL) BY MOUTH DAILY. 09/04/15   Sela Hua, MD   BP 165/81 mmHg  Pulse 89  Temp(Src) 98.3 F (36.8 C) (Oral)  Resp 20  Ht 5\' 6"  (1.676 m)  Wt 70.308 kg  BMI 25.03 kg/m2  SpO2 99%   Physical Exam  Constitutional: He is oriented to person, place, and time. He appears well-developed and well-nourished. No distress.  HENT:  Head: Normocephalic and atraumatic.  Mouth/Throat: Oropharynx is clear and moist.  Eyes: Conjunctivae and EOM are normal. Pupils are equal,  round, and reactive to light.  Neck: Normal range of motion. Neck supple.  Cardiovascular: Normal rate, regular rhythm and normal heart sounds.   Pulmonary/Chest: Effort normal and breath sounds normal. No respiratory distress. He has no wheezes.  Abdominal: Soft. Bowel sounds are normal. There is no tenderness. There is no guarding and no CVA tenderness.  Abdomen soft, non-tender currently, normal bowel sounds  Musculoskeletal: Normal range of motion.  Right arm amputation  Neurological: He is alert and oriented to person, place, and time.  Skin: Skin is warm and dry. He is not diaphoretic.  Psychiatric: He has a normal mood and affect.  Nursing note and vitals reviewed.   ED Course  Procedures (including critical care time) Labs Review Labs Reviewed  COMPREHENSIVE METABOLIC PANEL - Abnormal; Notable for the following:    Glucose, Bld 112 (*)    ALT 13 (*)    All other components within normal limits  URINALYSIS, ROUTINE W REFLEX MICROSCOPIC (NOT AT [facility]) - Abnormal; Notable for the following:    Leukocytes, UA TRACE (*)    All other components within normal limits  URINE MICROSCOPIC-ADD ON - Abnormal; Notable for the following:    Squamous Epithelial / LPF 0-5 (*)    Bacteria, UA FEW (*)    All other components within normal limits  URINE CULTURE  LIPASE, BLOOD  CBC    Imaging Review Ct Abdomen Pelvis W Contrast  10/07/2015  CLINICAL DATA:  Intermittent abdominal pain over the past 2-3 weeks. Pain is worse today. Nausea. EXAM: CT ABDOMEN AND PELVIS WITH CONTRAST TECHNIQUE: Multidetector CT imaging of the abdomen and pelvis was performed using the standard protocol following bolus administration of intravenous contrast. CONTRAST:  1 ISOVUE-300 IOPAMIDOL (ISOVUE-300) INJECTION 61% COMPARISON:  CT abdomen and pelvis without and with contrast 12/03/2013. FINDINGS: Lower chest: Minimal dependent atelectasis is present bilaterally. No focal nodule, mass, or airspace disease is present  otherwise. The heart size is normal. Hepatobiliary: Multiple hepatic cysts are stable. No new hepatic lesions are present. There is no significant surface regularity. The common bile duct is normal. The gallbladder is distended. There is mild prominence of the gallbladder wall with enhancement. There may be some stranding of the adjacent fat. Pancreas: Within normal limits. Spleen: Unremarkable Adrenals/Urinary Tract: The adrenal glands are normal bilaterally. A simple cyst posteriorly in the right kidney is stable. Kidneys are otherwise within normal limits. The ureters and urinary bladder are within normal limits. Stomach/Bowel: Stomach and duodenum are within normal limits. Small bowel is unremarkable. The ileocecal valve is within normal limits. The appendix is visualized and normal. The ascending and transverse colon are within normal limits. Diverticular changes are present in the descending and sigmoid  colon without focal inflammation suggest diverticulitis. Vascular/Lymphatic: Atherosclerotic calcifications are present in the aorta and branch vessels without aneurysm. No significant adenopathy is present. Reproductive: The prostate gland is enlarged. It measures 5.8 cm in transverse diameter. No discrete mass lesion is present. Other: No significant free fluid or free air is present. Musculoskeletal: Bone windows demonstrate stable degenerative anterolisthesis at L4-5 with marked facet hypertrophy and bilateral foraminal narrowing. Less severe degenerate changes are present at L3-4 and L5-S1 as well. Degenerate changes in the lower thoracic spine are most severe at T10-11 with bilateral foraminal stenosis. No focal lytic or blastic lesions are present. Pelvis is intact. Advanced degenerative changes are present in the left hip. IMPRESSION: 1. Mild distension of the gallbladder with diffuse wall thickening and possible stranding. This could represent early cholecystitis. Recommend right upper quadrant  ultrasound further evaluation of the gallbladder. 2. Stable hepatic and right renal cysts. 3. Stable moderate degenerative changes in the lower thoracic and lumbar spine and advanced degenerative changes of the left hip. Electronically Signed   By: San Morelle M.D.   On: 10/07/2015 08:15   US Abdomen Limited  10/07/2015  CLINICAL DATA:  Cholecystitis.  Gallbladder wall thickening. EXAM: US ABDOMEN LIMITED - RIGHT UPPER QUADRANT COMPARISON:  10/07/2015 FINDINGS: Gallbladder: The gallbladder wall appears diffusely thickened measuring 6 mm. Stone scratch set multiple stones are noted measuring up to 7 mm. Negative sonographic Murphy's sign. Common bile duct: Diameter: 3 mm. Liver: There is a cyst within right lobe of liver measuring 2.6 x 1.8 x 2.6 cm. Within normal limits in parenchymal echogenicity. IMPRESSION: 1. Gallstones and gallbladder wall thickening identified compatible with acute cholecystitis. Electronically Signed   By: Kerby Moors M.D.   On: 10/07/2015 10:47   I have personally reviewed and evaluated these images and lab results as part of my medical decision-making.   EKG Interpretation None      MDM   Final diagnoses:  Calculus of gallbladder with acute cholecystitis without obstruction   75 y.o. m here with intermittent abdominal pain for the past month.  He is afebrile, non-toxic.  His abdomen is soft and non-tender currently.  VSS.  Will plan for labs, CT scan for further evaluation.  Labs reassuring.  U/a with few bacteria noted-- patient does have hx of chronic cystitis.  No currently urinary symptoms.  Will send for culture.  CT scan with gallstones and wall thickening.  RUQ ultrasound was obtained worrisome for acute cholecystitis.  General surgery was consulted and elected to admit the patient for further management.  Larene Pickett, PA-C 10/07/15 1238  Everlene Balls, MD 10/07/15 [phone]

## 2015-10-07 NOTE — ED Notes (Signed)
Pt brought to ED by [facility] for c/o 6/10 abd pain, pt states he has this pain for the past 2-3 weeks on and off, but feeling worse today. Denies fever, chills or diarrhea states he feels nauseated.

## 2015-10-07 NOTE — ED Notes (Signed)
Pt ambulatory w/ steady gait to restroom. 

## 2015-10-07 NOTE — Discharge Instructions (Signed)
You are having abdominal pain in the abdomen for which Surgery doctor was consulted. Surgery team ordered further testing to see if you needed emergent surgery - and it appears that is not the case.  The Surgery team recommends that you continue to see them - please call the number provided to see them in 2 weeks. They also recommend that you see GI doctors - please call them as well to set up an appointment. Return to the ER if the symptoms get worse.  Please return to the ER if your symptoms worsen; you have increased pain, fevers, chills, inability to keep any medications down, confusion. Otherwise see the outpatient doctor as requested.   Cholelithiasis Cholelithiasis (also called gallstones) is a form of gallbladder disease in which gallstones form in your gallbladder. The gallbladder is an organ that stores bile made in the liver, which helps digest fats. Gallstones begin as small crystals and slowly grow into stones. Gallstone pain occurs when the gallbladder spasms and a gallstone is blocking the duct. Pain can also occur when a stone passes out of the duct.  RISK FACTORS  Being male.   Having multiple pregnancies. Health care providers sometimes advise removing diseased gallbladders before future pregnancies.   Being obese.  Eating a diet heavy in fried foods and fat.   Being older than 77 years and increasing age.   Prolonged use of medicines containing male hormones.   Having diabetes mellitus.   Rapidly losing weight.   Having a family history of gallstones (heredity).  SYMPTOMS  Nausea.   Vomiting.  Abdominal pain.   Yellowing of the skin (jaundice).   Sudden pain. It may persist from several minutes to several hours.  Fever.   Tenderness to the touch. In some cases, when gallstones do not move into the bile duct, people have no pain or symptoms. These are called "silent" gallstones.  TREATMENT Silent gallstones do not need treatment. In  severe cases, emergency surgery may be required. Options for treatment include:  Surgery to remove the gallbladder. This is the most common treatment.  Medicines. These do not always work and may take 6-12 months or more to work.  Shock wave treatment (extracorporeal biliary lithotripsy). In this treatment an ultrasound machine sends shock waves to the gallbladder to break gallstones into smaller pieces that can pass into the intestines or be dissolved by medicine. HOME CARE INSTRUCTIONS   Only take over-the-counter or prescription medicines for pain, discomfort, or fever as directed by your health care provider.   Follow a low-fat diet until seen again by your health care provider. Fat causes the gallbladder to contract, which can result in pain.   Follow up with your health care provider as directed. Attacks are almost always recurrent and surgery is usually required for permanent treatment.  SEEK IMMEDIATE MEDICAL CARE IF:   Your pain increases and is not controlled by medicines.   You have a fever or persistent symptoms for more than 2-3 days.   You have a fever and your symptoms suddenly get worse.   You have persistent nausea and vomiting.  MAKE SURE YOU:   Understand these instructions.  Will watch your condition.  Will get help right away if you are not doing well or get worse.   This information is not intended to replace advice given to you by your health care provider. Make sure you discuss any questions you have with your health care provider.   Document Released: 03/07/2005 Document Revised: 11/11/2012 Document  Reviewed: 09/02/2012 Elsevier Interactive Patient Education [employer].

## 2015-10-07 NOTE — ED Notes (Signed)
Pt verbalized understanding of d/c instructions, prescriptions, and follow-up care. No further questions/concerns, VSS, ambulatory w/ steady gait (refused wheelchair) 

## 2015-10-07 NOTE — ED Notes (Signed)
PT going to Nuclear Med

## 2015-10-07 NOTE — ED Notes (Signed)
Pt being transported to US

## 2015-10-07 NOTE — ED Notes (Addendum)
This RN entered pt room to explain pt had been assigned bed on 6N and would be transported to floor. Pt family member and pt began yelling at RN stating no one had told pt he would be admitted. This RN stated Dr. [name] was admitting pt and had been to see pt earlier. Pt states that Dr. [name] had not been in room to discuss plan and that "RN was accusing pt of being crazy." This RN had seen Dr. [name] on unit around 1200 and Dr. [name] had spoken to this RN prior to orders for nuclear scan and this RN was under assumption Dr. [name] had updated pt on plan. Dr. Kathrynn Humble at bedside to discuss plan with pt and pt family member. Dr. [name] was paged by this RN and Dr. [name] stated that he explained entire process with pt. Dr. [name] to come down to see pt to review plan of care.

## 2015-10-07 NOTE — H&P (Signed)
Phillip Hill is an 75 y.o. male.   Chief Complaint: Abdominal pain HPI: Patient has  Had abdominal discomfort for the past two weeks, on and off, not associated with nausea or vomiting.  As part of a general work up the patient a CT scan was done which showed possible acute cholecystitis, then and ultrasound confirmed the possibility, but the patient is clinically asymptomatic.  He never had RUQ pain.  We were asked to see him for acute cholecytitis.  Past Medical History  Diagnosis Date  . Hemorrhoids   . Arthritis   . History of herpes simplex type 2 infection   . PAC (premature atrial contraction)   . Complex partial seizure disorder ([place])     began 1970's--  last seizure 1995  per neurologist note 10/ 2015--  dr Krista Blue  . History of lower GI bleeding     secondary to ASA use--  resolved  . Diverticulosis of colon   . Chronic cystitis   . Hypertension   . Frequency of urination   . Urgency of urination   . Nocturia   . Lazy eye of left side     Past Surgical History  Procedure Laterality Date  . 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 Colonoscopy  10/24/2011    Procedure: COLONOSCOPY;  Surgeon: Milus Banister, MD;  Location: [facility];  Service: Endoscopy;  Laterality: N/A;  . Inguinal hernia repair Left 09-15-2009  . Transthoracic echocardiogram  10-28-2013   dr Tressia Miners turner    mild focal basal hypertrophy of septum/  ef 31-12%/  grade I diastolic dysfunction/  mild AR/  trivial MR and TR  . Cystoscopy with biopsy N/A 06/28/2014    Procedure: CYSTOSCOPY WITH BIOPSY;  Surgeon: Lowella Bandy, MD;  Location: [facility];  Service: Urology;  Laterality: N/A;    Family History  Problem Relation Age of Onset  . Cancer Mother   . Cancer Sister   . Cancer Brother    Social History:  reports that he quit smoking about 46 years ago. His smoking use included Cigarettes. He has never used smokeless tobacco. He reports that he does not drink alcohol or use  illicit drugs.  Allergies:  Allergies  Allergen Reactions  . Ciprofloxacin Other (See Comments)    "shaking, like cold chills"  . Penicillins Hives     (Not in a hospital admission)  Results for orders placed or performed during the hospital encounter of 10/07/15 (from the past 48 hour(s))  Lipase, blood     Status: None   Collection Time: 10/07/15  6:05 AM  Result Value Ref Range   Lipase 39 11 - 51 U/L  Comprehensive metabolic panel     Status: Abnormal   Collection Time: 10/07/15  6:05 AM  Result Value Ref Range   Sodium 139 135 - 145 mmol/L   Potassium 3.7 3.5 - 5.1 mmol/L   Chloride 102 101 - 111 mmol/L   CO2 26 22 - 32 mmol/L   Glucose, Bld 112 (H) 65 - 99 mg/dL   BUN 9 6 - 20 mg/dL   Creatinine, Ser 0.92 0.61 - 1.24 mg/dL   Calcium 9.6 8.9 - 10.3 mg/dL   Total Protein 7.0 6.5 - 8.1 g/dL   Albumin 3.6 3.5 - 5.0 g/dL   AST 16 15 - 41 U/L   ALT 13 (L) 17 - 63 U/L   Alkaline Phosphatase 111 38 - 126 U/L   Total  Bilirubin 0.3 0.3 - 1.2 mg/dL   GFR calc non Af Amer >60 >60 mL/min   GFR calc Af Amer >60 >60 mL/min    Comment: (NOTE) The eGFR has been calculated using the CKD EPI equation. This calculation has not been validated in all clinical situations. eGFR's persistently <60 mL/min signify possible Chronic Kidney Disease.    Anion gap 11 5 - 15  CBC     Status: None   Collection Time: 10/07/15  6:05 AM  Result Value Ref Range   WBC 6.3 4.0 - 10.5 K/uL   RBC 4.28 4.22 - 5.81 MIL/uL   Hemoglobin 13.9 13.0 - 17.0 g/dL   HCT 39.8 39.0 - 52.0 %   MCV 93.0 78.0 - 100.0 fL   MCH 32.5 26.0 - 34.0 pg   MCHC 34.9 30.0 - 36.0 g/dL   RDW 12.9 11.5 - 15.5 %   Platelets 243 150 - 400 K/uL  Urinalysis, Routine w reflex microscopic     Status: Abnormal   Collection Time: 10/07/15  6:34 AM  Result Value Ref Range   Color, Urine YELLOW YELLOW   APPearance CLEAR CLEAR   Specific Gravity, Urine 1.015 1.005 - 1.030   pH 6.5 5.0 - 8.0   Glucose, UA NEGATIVE NEGATIVE mg/dL    Hgb urine dipstick NEGATIVE NEGATIVE   Bilirubin Urine NEGATIVE NEGATIVE   Ketones, ur NEGATIVE NEGATIVE mg/dL   Protein, ur NEGATIVE NEGATIVE mg/dL   Nitrite NEGATIVE NEGATIVE   Leukocytes, UA TRACE (A) NEGATIVE  Urine microscopic-add on     Status: Abnormal   Collection Time: 10/07/15  6:34 AM  Result Value Ref Range   Squamous Epithelial / LPF 0-5 (A) NONE SEEN   WBC, UA 6-30 0 - 5 WBC/hpf   RBC / HPF 0-5 0 - 5 RBC/hpf   Bacteria, UA FEW (A) NONE SEEN   Ct Abdomen Pelvis W Contrast  10/07/2015  CLINICAL DATA:  Intermittent abdominal pain over the past 2-3 weeks. Pain is worse today. Nausea. EXAM: CT ABDOMEN AND PELVIS WITH CONTRAST TECHNIQUE: Multidetector CT imaging of the abdomen and pelvis was performed using the standard protocol following bolus administration of intravenous contrast. CONTRAST:  1 ISOVUE-300 IOPAMIDOL (ISOVUE-300) INJECTION 61% COMPARISON:  CT abdomen and pelvis without and with contrast 12/03/2013. FINDINGS: Lower chest: Minimal dependent atelectasis is present bilaterally. No focal nodule, mass, or airspace disease is present otherwise. The heart size is normal. Hepatobiliary: Multiple hepatic cysts are stable. No new hepatic lesions are present. There is no significant surface regularity. The common bile duct is normal. The gallbladder is distended. There is mild prominence of the gallbladder wall with enhancement. There may be some stranding of the adjacent fat. Pancreas: Within normal limits. Spleen: Unremarkable Adrenals/Urinary Tract: The adrenal glands are normal bilaterally. A simple cyst posteriorly in the right kidney is stable. Kidneys are otherwise within normal limits. The ureters and urinary bladder are within normal limits. Stomach/Bowel: Stomach and duodenum are within normal limits. Small bowel is unremarkable. The ileocecal valve is within normal limits. The appendix is visualized and normal. The ascending and transverse colon are within normal limits.  Diverticular changes are present in the descending and sigmoid colon without focal inflammation suggest diverticulitis. Vascular/Lymphatic: Atherosclerotic calcifications are present in the aorta and branch vessels without aneurysm. No significant adenopathy is present. Reproductive: The prostate gland is enlarged. It measures 5.8 cm in transverse diameter. No discrete mass lesion is present. Other: No significant free fluid or free air is present.  Musculoskeletal: Bone windows demonstrate stable degenerative anterolisthesis at L4-5 with marked facet hypertrophy and bilateral foraminal narrowing. Less severe degenerate changes are present at L3-4 and L5-S1 as well. Degenerate changes in the lower thoracic spine are most severe at T10-11 with bilateral foraminal stenosis. No focal lytic or blastic lesions are present. Pelvis is intact. Advanced degenerative changes are present in the left hip. IMPRESSION: 1. Mild distension of the gallbladder with diffuse wall thickening and possible stranding. This could represent early cholecystitis. Recommend right upper quadrant ultrasound further evaluation of the gallbladder. 2. Stable hepatic and right renal cysts. 3. Stable moderate degenerative changes in the lower thoracic and lumbar spine and advanced degenerative changes of the left hip. Electronically Signed   By: San Morelle M.D.   On: 10/07/2015 08:15   US Abdomen Limited  10/07/2015  CLINICAL DATA:  Cholecystitis.  Gallbladder wall thickening. EXAM: US ABDOMEN LIMITED - RIGHT UPPER QUADRANT COMPARISON:  10/07/2015 FINDINGS: Gallbladder: The gallbladder wall appears diffusely thickened measuring 6 mm. Stone scratch set multiple stones are noted measuring up to 7 mm. Negative sonographic Murphy's sign. Common bile duct: Diameter: 3 mm. Liver: There is a cyst within right lobe of liver measuring 2.6 x 1.8 x 2.6 cm. Within normal limits in parenchymal echogenicity. IMPRESSION: 1. Gallstones and gallbladder  wall thickening identified compatible with acute cholecystitis. Electronically Signed   By: Kerby Moors M.D.   On: 10/07/2015 10:47    Review of Systems  Constitutional: Negative for fever and chills.  Gastrointestinal: Positive for abdominal pain (periumbilical pain). Negative for nausea and vomiting.  All other systems reviewed and are negative.   Blood pressure 112/95, pulse 92, temperature 98.3 F (36.8 C), temperature source Oral, resp. rate 16, height '5\' 6"'  (1.676 m), weight 70.308 kg (155 lb), SpO2 97 %. Physical Exam  Nursing note and vitals reviewed. Constitutional: He is oriented to person, place, and time. He appears well-developed and well-nourished.  HENT:  Head: Normocephalic and atraumatic.  Eyes: Conjunctivae and EOM are normal. Pupils are equal, round, and reactive to light.  Neck: Normal range of motion. Neck supple.  Cardiovascular: Normal rate, regular rhythm and normal heart sounds.   Respiratory: Effort normal and breath sounds normal.  GI: Soft. Bowel sounds are normal. He exhibits no distension. There is no tenderness. There is no rigidity, no rebound and no guarding.  Musculoskeletal: Normal range of motion.       Arms: Neurological: He is alert and oriented to person, place, and time.  Skin: Skin is warm and dry.  Psychiatric: He has a normal mood and affect. His behavior is normal. Judgment and thought content normal.     Assessment/Plan Periumbilical abdominal pain with CT and [place] suggesting acute cholecystitis, but no RUQ tenderness or pain.  LFTs completely normal.  Before I would proceed with Lap Chole, I would like to get a HIDA scan.  This is primarily because the clinical examination does not fit with the [place] or CT findings.  If the HIDA is positive then we will proceed with Lap Chole, likely tomorrow.  If the HIDA shows a patent cystic duct then further work up for his pain may be necessary with GI.  Judeth Horn, MD 10/07/2015, 11:58 AM

## 2015-10-07 NOTE — ED Notes (Signed)
This RN was contacted by nuclear med to discuss administering morphine to pt per radiologist order to enable nuclear med to visualize gallbladder.

## 2015-10-09 LAB — URINE CULTURE

## 2015-10-10 NOTE — Progress Notes (Signed)
ED Antimicrobial Stewardship Positive Culture Follow Up   Phillip Hill is an 75 y.o. male who presented to [facility] on 10/07/2015 with a chief complaint of  Chief Complaint  Patient presents with  . Abdominal Pain    Recent Results (from the past 720 hour(s))  Urine culture     Status: Abnormal   Collection Time: 10/07/15  6:34 AM  Result Value Ref Range Status   Specimen Description URINE, CLEAN CATCH  Final   Special Requests NONE  Final   Culture >=100,000 COLONIES/mL ESCHERICHIA COLI (A)  Final   Report Status 10/09/2015 FINAL  Final   Organism ID, Bacteria ESCHERICHIA COLI (A)  Final      Susceptibility   Escherichia coli - MIC*    AMPICILLIN >=32 RESISTANT Resistant     CEFAZOLIN >=64 RESISTANT Resistant     CEFTRIAXONE <=1 SENSITIVE Sensitive     CIPROFLOXACIN <=0.25 SENSITIVE Sensitive     GENTAMICIN <=1 SENSITIVE Sensitive     IMIPENEM <=0.25 SENSITIVE Sensitive     NITROFURANTOIN <=16 SENSITIVE Sensitive     TRIMETH/SULFA <=20 SENSITIVE Sensitive     AMPICILLIN/SULBACTAM >=32 RESISTANT Resistant     PIP/TAZO 64 INTERMEDIATE Intermediate     * >=100,000 COLONIES/mL ESCHERICHIA COLI    [x]  Patient discharged originally without antimicrobial agent and treatment is now indicated  [name] who presented on [id number] with periumbilical abd pain x 2 weeks. CT was positive for acute cholecystitis however pt was clinically asymptomatic (no RUQ pain). Surgery was consulted for admit and wanted to get a HIDA scan prior to lap chole. Pt refused admission.  Urine cultures are now showing E.coli - discussed with PA-C and will treat.   New antibiotic prescription: Bactrim DS 1 tab bid x 7 days. Also discuss follow-up with patient regarding acute cholecystitis.   ED Provider: Waynetta Pean, PA-C  Lawson Radar 10/10/2015, 9:53 AM Infectious Diseases Pharmacist Phone# [phone]

## 2015-10-12 ENCOUNTER — Telehealth: Payer: Self-pay | Admitting: *Deleted

## 2015-10-12 NOTE — Telephone Encounter (Signed)
Post ED Visit - Positive Culture Follow-up: Successful Patient Follow-Up  Culture assessed and recommendations reviewed by: []  Elenor Quinones, Pharm.D. []  Heide Guile, Pharm.D., BCPS []  Parks Neptune, Pharm.D. [x]  Alycia Rossetti, Pharm.D., BCPS []  [place], [place].D., BCPS, AAHIVP []  Legrand Como, Pharm.D., BCPS, AAHIVP []  Cassie Stewart, Pharm.D. []  [place] November, [place].D.  Positive urine culture  [x]  Patient discharged without antimicrobial prescription and treatment is now indicated []  Organism is resistant to prescribed ED discharge antimicrobial []  Patient with positive blood cultures  Changes discussed with ED provider Waynetta Pean, PA-C New antibiotic prescription Bactrim DS 1 tab PO BID x 7 days Called to [facility] [employer] [phone]  Contacted patient, date 10/12/2015, time [place], [place] 10/12/2015, 4:59 PM

## 2015-10-20 ENCOUNTER — Emergency Department (HOSPITAL_COMMUNITY)
Admission: EM | Admit: 2015-10-20 | Discharge: 2015-10-20 | Disposition: A | Discharge status: Home / Self Care | Payer: Medicare Other | Source: Home / Self Care | Attending: Emergency Medicine | Admitting: Emergency Medicine

## 2015-10-20 ENCOUNTER — Encounter (HOSPITAL_COMMUNITY): Payer: Self-pay | Admitting: Emergency Medicine

## 2015-10-20 DIAGNOSIS — R110 Nausea: Secondary | ICD-10-CM | POA: Diagnosis not present

## 2015-10-20 DIAGNOSIS — Z79899 Other long term (current) drug therapy: Secondary | ICD-10-CM

## 2015-10-20 DIAGNOSIS — R509 Fever, unspecified: Secondary | ICD-10-CM | POA: Diagnosis not present

## 2015-10-20 DIAGNOSIS — I10 Essential (primary) hypertension: Secondary | ICD-10-CM

## 2015-10-20 DIAGNOSIS — K819 Cholecystitis, unspecified: Secondary | ICD-10-CM | POA: Diagnosis not present

## 2015-10-20 DIAGNOSIS — R109 Unspecified abdominal pain: Secondary | ICD-10-CM | POA: Insufficient documentation

## 2015-10-20 DIAGNOSIS — R739 Hyperglycemia, unspecified: Secondary | ICD-10-CM

## 2015-10-20 DIAGNOSIS — K2970 Gastritis, unspecified, without bleeding: Secondary | ICD-10-CM | POA: Diagnosis not present

## 2015-10-20 DIAGNOSIS — Z87891 Personal history of nicotine dependence: Secondary | ICD-10-CM

## 2015-10-20 DIAGNOSIS — R1011 Right upper quadrant pain: Secondary | ICD-10-CM | POA: Diagnosis not present

## 2015-10-20 DIAGNOSIS — G40209 Localization-related (focal) (partial) symptomatic epilepsy and epileptic syndromes with complex partial seizures, not intractable, without status epilepticus: Secondary | ICD-10-CM | POA: Diagnosis not present

## 2015-10-20 DIAGNOSIS — K8000 Calculus of gallbladder with acute cholecystitis without obstruction: Secondary | ICD-10-CM | POA: Diagnosis not present

## 2015-10-20 DIAGNOSIS — R1084 Generalized abdominal pain: Secondary | ICD-10-CM | POA: Diagnosis not present

## 2015-10-20 DIAGNOSIS — R188 Other ascites: Secondary | ICD-10-CM | POA: Diagnosis not present

## 2015-10-20 DIAGNOSIS — K660 Peritoneal adhesions (postprocedural) (postinfection): Secondary | ICD-10-CM | POA: Diagnosis not present

## 2015-10-20 DIAGNOSIS — K8020 Calculus of gallbladder without cholecystitis without obstruction: Secondary | ICD-10-CM | POA: Diagnosis not present

## 2015-10-20 LAB — DIFFERENTIAL
BASOS PCT: 0 %
Basophils Absolute: 0 10*3/uL (ref 0.0–0.1)
EOS ABS: 0.1 10*3/uL (ref 0.0–0.7)
EOS PCT: 1 %
Lymphocytes Relative: 30 %
Lymphs Abs: 3 10*3/uL (ref 0.7–4.0)
Monocytes Absolute: 0.9 10*3/uL (ref 0.1–1.0)
Monocytes Relative: 8 %
NEUTROS PCT: 61 %
Neutro Abs: 6.1 10*3/uL (ref 1.7–7.7)

## 2015-10-20 LAB — URINE MICROSCOPIC-ADD ON: Bacteria, UA: NONE SEEN

## 2015-10-20 LAB — CBC
HCT: 38.4 % — ABNORMAL LOW (ref 39.0–52.0)
Hemoglobin: 13.6 g/dL (ref 13.0–17.0)
MCH: 33.2 pg (ref 26.0–34.0)
MCHC: 35.4 g/dL (ref 30.0–36.0)
MCV: 93.7 fL (ref 78.0–100.0)
PLATELETS: 273 10*3/uL (ref 150–400)
RBC: 4.1 MIL/uL — AB (ref 4.22–5.81)
RDW: 13 % (ref 11.5–15.5)
WBC: 10.1 10*3/uL (ref 4.0–10.5)

## 2015-10-20 LAB — COMPREHENSIVE METABOLIC PANEL
ALT: 13 U/L — AB (ref 17–63)
AST: 20 U/L (ref 15–41)
Albumin: 4 g/dL (ref 3.5–5.0)
Alkaline Phosphatase: 119 U/L (ref 38–126)
Anion gap: 11 (ref 5–15)
BUN: 10 mg/dL (ref 6–20)
CHLORIDE: 98 mmol/L — AB (ref 101–111)
CO2: 25 mmol/L (ref 22–32)
CREATININE: 1.19 mg/dL (ref 0.61–1.24)
Calcium: 9.6 mg/dL (ref 8.9–10.3)
GFR, EST NON AFRICAN AMERICAN: 58 mL/min — AB (ref >60)
Glucose, Bld: 185 mg/dL — ABNORMAL HIGH (ref 65–99)
Potassium: 3.7 mmol/L (ref 3.5–5.1)
Sodium: 134 mmol/L — ABNORMAL LOW (ref 135–145)
TOTAL PROTEIN: 8.3 g/dL — AB (ref 6.5–8.1)
Total Bilirubin: 0.4 mg/dL (ref 0.3–1.2)

## 2015-10-20 LAB — URINALYSIS, ROUTINE W REFLEX MICROSCOPIC
Bilirubin Urine: NEGATIVE
GLUCOSE, UA: NEGATIVE mg/dL
Hgb urine dipstick: NEGATIVE
KETONES UR: NEGATIVE mg/dL
LEUKOCYTES UA: NEGATIVE
NITRITE: NEGATIVE
PROTEIN: 30 mg/dL — AB
Specific Gravity, Urine: 1.02 (ref 1.005–1.030)
pH: 6 (ref 5.0–8.0)

## 2015-10-20 LAB — LIPASE, BLOOD: LIPASE: 26 U/L (ref 11–51)

## 2015-10-20 MED ORDER — HYDROMORPHONE HCL 1 MG/ML IJ SOLN
1.0000 mg | Freq: Once | INTRAMUSCULAR | Status: AC
  Administered 2015-10-20: 1 mg via INTRAVENOUS
  Filled 2015-10-20: qty 1

## 2015-10-20 MED ORDER — OXYCODONE-ACETAMINOPHEN 5-325 MG PO TABS: 1.0000 | ORAL_TABLET | ORAL | 0 refills | Status: DC | PRN

## 2015-10-20 MED ORDER — SODIUM CHLORIDE 0.9 % IV SOLN
1000.0000 mL | Freq: Once | INTRAVENOUS | Status: AC
  Administered 2015-10-20: 1000 mL via INTRAVENOUS

## 2015-10-20 MED ORDER — MORPHINE SULFATE (PF) 4 MG/ML IV SOLN
4.0000 mg | Freq: Once | INTRAVENOUS | Status: AC
  Administered 2015-10-20: 4 mg via INTRAVENOUS
  Filled 2015-10-20: qty 1

## 2015-10-20 MED ORDER — ONDANSETRON HCL 4 MG/2ML IJ SOLN
4.0000 mg | Freq: Once | INTRAMUSCULAR | Status: AC
  Administered 2015-10-20: 4 mg via INTRAVENOUS
  Filled 2015-10-20: qty 2

## 2015-10-20 MED ORDER — SODIUM CHLORIDE 0.9 % IV SOLN
1000.0000 mL | INTRAVENOUS | Status: DC
  Administered 2015-10-20: 1000 mL via INTRAVENOUS

## 2015-10-20 NOTE — ED Provider Notes (Signed)
[facility] DEPT Provider Note   CSN: [id number] Arrival date & time: 10/20/15  [id number]  First Provider Contact:  None    By signing my name below, I, Phillip Hill, attest that this documentation has been prepared under the direction and in the presence of Phillip Fuel, MD.  Electronically Signed: Julien Hill, ED Scribe. 10/20/15. 4:03 AM.   History   Chief Complaint Chief Complaint  Patient presents with  . Abdominal Pain    The history is provided by the patient. No language interpreter was used.   HPI Comments: Phillip Hill is a 75 y.o. male who has a PMhx of chronic cystitis, diverticulosis of colon, lower GI bleeding, hemorrhoids, and HTN presents to the Emergency Department complaining of sudden onset, gradual worsening, constant, 9/10, aching generalized abdominal pain onset 3 hours ago. He notes associated nausea. Pt reports eating corned beef hash and eggs this morning and he believes that may be the source of his abdominal pain. He was seen 2 weeks ago in th ED for the same pain where he was told that he had gallstones that needed to be removed. Pt denies any other complaints.   Past Medical History:  Diagnosis Date  . Arthritis   . Chronic cystitis   . Complex partial seizure disorder ([place])    began 1970's--  last seizure 1995  per neurologist note 10/ 2015--  dr Krista Blue  . Diverticulosis of colon   . Frequency of urination   . Hemorrhoids   . History of herpes simplex type 2 infection   . History of lower GI bleeding    secondary to ASA use--  resolved  . Hypertension   . Lazy eye of left side   . Nocturia   . PAC (premature atrial contraction)   . Urgency of urination     Patient Active Problem List   Diagnosis Date Noted  . Cholelithiases 10/07/2015  . Health care maintenance 09/07/2015  . Encounter to establish care 06/09/2015  . HSV-2 (herpes simplex virus 2) infection 06/09/2015  . Palpitations 10/20/2013  . SOB (shortness of breath) 10/20/2013  .  Hemorrhoids   . Arthritis   . Sleep disorder   . Seizures ([place])   . Hypertension   . Other and unspecified hyperlipidemia   . Upper limb amputation, shoulder   . Essential and other specified forms of tremor   . Diverticula of colon 10/24/2011  . GI bleeding 10/23/2011  . Acute blood loss anemia 10/21/2011  . Seizure disorder ([place]) 10/21/2011  . Dyslipidemia 10/21/2011    Past Surgical History:  Procedure Laterality Date  . ARM AMPUTATION Right 1960   infection due to injury  . COLONOSCOPY  10/24/2011   Procedure: COLONOSCOPY;  Surgeon: Milus Banister, MD;  Location: [facility];  Service: Endoscopy;  Laterality: N/A;  . CYSTOSCOPY WITH BIOPSY N/A 06/28/2014   Procedure: CYSTOSCOPY WITH BIOPSY;  Surgeon: Lowella Bandy, MD;  Location: [facility];  Service: Urology;  Laterality: N/A;  . INGUINAL HERNIA REPAIR Left 09-15-2009  . TRANSTHORACIC ECHOCARDIOGRAM  10-28-2013   dr Tressia Miners turner   mild focal basal hypertrophy of septum/  ef [id number]  grade I diastolic dysfunction/  mild AR/  trivial MR and TR       Home Medications    Prior to Admission medications   Medication Sig Start Date End Date Taking? Authorizing Provider  hydrALAZINE (APRESOLINE) 25 MG tablet Take 1 tablet (25 mg total) by mouth 2 (two) times daily. 08/23/15  Sela Hua, MD  metoprolol (LOPRESSOR) 50 MG tablet TAKE 1 TABLET (50 MG TOTAL) BY MOUTH EVERY MORNING. 08/22/15   Sela Hua, MD  ondansetron (ZOFRAN ODT) 8 MG disintegrating tablet Take 1 tablet (8 mg total) by mouth every 8 (eight) hours as needed for nausea. 10/07/15   Varney Biles, MD  phenytoin (DILANTIN) 100 MG ER capsule Take 3 capsules (300 mg total) by mouth at bedtime. 08/25/15   Sela Hua, MD  traMADol (ULTRAM) 50 MG tablet Take 2 tablets (100 mg total) by mouth every 8 (eight) hours as needed. 10/07/15   Varney Biles, MD  valACYclovir (VALTREX) 500 MG tablet TAKE 1 TABLET (500 MG TOTAL) BY MOUTH DAILY. 09/04/15   Sela Hua, MD    Family History Family History  Problem Relation Age of Onset  . Cancer Mother   . Cancer Sister   . Cancer Brother     Social History Social History  Substance Use Topics  . Smoking status: Former Smoker    Types: Cigarettes    Quit date: 10/23/1968  . Smokeless tobacco: Never Used  . Alcohol use No     Comment: Quit alcohol in 1970     Allergies   Ciprofloxacin and Penicillins   Review of Systems Review of Systems  Gastrointestinal: Positive for abdominal pain. Negative for nausea.  All other systems reviewed and are negative.    Physical Exam Updated Vital Signs BP 151/66 (BP Location: Left Arm)   Pulse 75   Temp 98.2 F (36.8 C) (Oral)   Ht 5\' 6"  (1.676 m)   Wt 155 lb (70.3 kg)   SpO2 95%   BMI 25.02 kg/m   Physical Exam  Constitutional: He is oriented to person, place, and time. He appears well-developed and well-nourished.  HENT:  Head: Normocephalic and atraumatic.  Eyes: EOM are normal. Pupils are equal, round, and reactive to light.  Neck: Normal range of motion. Neck supple. No JVD present.  Cardiovascular: Normal rate, regular rhythm, normal heart sounds and intact distal pulses.   No murmur heard. Pulmonary/Chest: Effort normal and breath sounds normal. He has no wheezes. He has no rales. He exhibits no tenderness.  Abdominal: Soft. He exhibits no distension. There is tenderness.  Decreased bowel sounds  Musculoskeletal: Normal range of motion.  Status post amputation of right upper arm  Lymphadenopathy:    He has no cervical adenopathy.  Neurological: He is alert and oriented to person, place, and time. No cranial nerve deficit. He exhibits normal muscle tone. Coordination normal.  Skin: Skin is warm and dry.  Psychiatric: He has a normal mood and affect. His behavior is normal. Judgment and thought content normal.  Nursing note and vitals reviewed.    ED Treatments / Results  DIAGNOSTIC STUDIES: Oxygen Saturation is 95% on  RA, adequate by my interpretation.  COORDINATION OF CARE:  4:03 AM Discussed treatment plan with pt at bedside and pt agreed to plan.  Labs (all labs ordered are listed, but only abnormal results are displayed) Labs Reviewed  COMPREHENSIVE METABOLIC PANEL - Abnormal; Notable for the following:       Result Value   Sodium 134 (*)    Chloride 98 (*)    Glucose, Bld 185 (*)    Total Protein 8.3 (*)    ALT 13 (*)    GFR calc non Af Amer 58 (*)    All other components within normal limits  CBC - Abnormal; Notable for the following:  RBC 4.10 (*)    HCT 38.4 (*)    All other components within normal limits  URINALYSIS, ROUTINE W REFLEX MICROSCOPIC (NOT AT [facility]) - Abnormal; Notable for the following:    Protein, ur 30 (*)    All other components within normal limits  URINE MICROSCOPIC-ADD ON - Abnormal; Notable for the following:    Squamous Epithelial / LPF 0-5 (*)    Casts HYALINE CASTS (*)    All other components within normal limits  LIPASE, BLOOD  DIFFERENTIAL    Procedures Procedures (including critical care time)  Medications Ordered in ED Medications  0.9 %  sodium chloride infusion (0 mLs Intravenous Stopped 10/20/15 0546)    Followed by  0.9 %  sodium chloride infusion (1,000 mLs Intravenous New Bag/Given 10/20/15 0546)  ondansetron (ZOFRAN) injection 4 mg (4 mg Intravenous Given 10/20/15 0410)  morphine 4 MG/ML injection 4 mg (4 mg Intravenous Given 10/20/15 0411)  morphine 4 MG/ML injection 4 mg (4 mg Intravenous Given 10/20/15 0439)  HYDROmorphone (DILAUDID) injection 1 mg (1 mg Intravenous Given 10/20/15 0547)     Initial Impression / Assessment and Plan / ED Course  I have reviewed the triage vital signs and the nursing notes.  Pertinent labs that were available during my care of the patient were reviewed by me and considered in my medical decision making (see chart for details).  Clinical Course  Comment By Time  Mild hyperglycemia Phillip Fuel, MD [id number]     Abdominal pain in patient with known history of cholelithiasis. However, physical findings are not suggestive of biliary colic. Old records are reviewed and he was seen in the ED on July 15 with an ultrasound which was suggestive of cholecystitis but a HIDA scan showing patent cystic duct. Surgery recommended GI referral for workup. He is given IV fluids and morphine for pain. He did require additional analgesics. Laboratory workup is unremarkable. He is discharged with prescription for oxycodone have acetaminophen and is referred to gastroenterology for follow-up.  Final Clinical Impressions(s) / ED Diagnoses   Final diagnoses:  Abdominal pain, unspecified abdominal location   I personally performed the services described in this documentation, which was scribed in my presence. The recorded information has been reviewed and is accurate.    New Prescriptions New Prescriptions   No medications on file     Phillip Fuel, MD [id number]

## 2015-10-20 NOTE — ED Triage Notes (Signed)
Per EMS pt complains of abdominal pain for the past three hours, reports he was here two weeks ago and was told he had gallstones and would need surgery.  Hx of seizure d/o which are controlled w/ medication.  Pain woke pt out of a sleep tonight.

## 2015-10-20 NOTE — Discharge Instructions (Signed)
Return if pain is not being adequately controlled at home, or if you start running a fever. 

## 2015-10-21 ENCOUNTER — Emergency Department (HOSPITAL_COMMUNITY): Payer: Medicare Other

## 2015-10-21 ENCOUNTER — Encounter (HOSPITAL_COMMUNITY): Payer: Self-pay

## 2015-10-21 ENCOUNTER — Inpatient Hospital Stay (HOSPITAL_COMMUNITY)
Admission: EM | Admit: 2015-10-21 | Discharge: 2015-10-25 | DRG: 418 | Disposition: A | Discharge status: Home / Self Care | Payer: Medicare Other | Attending: General Surgery | Admitting: General Surgery

## 2015-10-21 DIAGNOSIS — R188 Other ascites: Secondary | ICD-10-CM | POA: Diagnosis present

## 2015-10-21 DIAGNOSIS — Z87891 Personal history of nicotine dependence: Secondary | ICD-10-CM | POA: Diagnosis not present

## 2015-10-21 DIAGNOSIS — G40209 Localization-related (focal) (partial) symptomatic epilepsy and epileptic syndromes with complex partial seizures, not intractable, without status epilepticus: Secondary | ICD-10-CM | POA: Diagnosis present

## 2015-10-21 DIAGNOSIS — K8000 Calculus of gallbladder with acute cholecystitis without obstruction: Principal | ICD-10-CM | POA: Diagnosis present

## 2015-10-21 DIAGNOSIS — K660 Peritoneal adhesions (postprocedural) (postinfection): Secondary | ICD-10-CM | POA: Diagnosis present

## 2015-10-21 DIAGNOSIS — Z419 Encounter for procedure for purposes other than remedying health state, unspecified: Secondary | ICD-10-CM

## 2015-10-21 DIAGNOSIS — R1011 Right upper quadrant pain: Secondary | ICD-10-CM

## 2015-10-21 DIAGNOSIS — E785 Hyperlipidemia, unspecified: Secondary | ICD-10-CM | POA: Diagnosis present

## 2015-10-21 DIAGNOSIS — K810 Acute cholecystitis: Secondary | ICD-10-CM | POA: Diagnosis not present

## 2015-10-21 DIAGNOSIS — R10811 Right upper quadrant abdominal tenderness: Secondary | ICD-10-CM | POA: Diagnosis not present

## 2015-10-21 DIAGNOSIS — R509 Fever, unspecified: Secondary | ICD-10-CM | POA: Diagnosis present

## 2015-10-21 DIAGNOSIS — M1990 Unspecified osteoarthritis, unspecified site: Secondary | ICD-10-CM | POA: Diagnosis not present

## 2015-10-21 DIAGNOSIS — K819 Cholecystitis, unspecified: Secondary | ICD-10-CM | POA: Diagnosis not present

## 2015-10-21 DIAGNOSIS — I10 Essential (primary) hypertension: Secondary | ICD-10-CM | POA: Diagnosis present

## 2015-10-21 DIAGNOSIS — K8012 Calculus of gallbladder with acute and chronic cholecystitis without obstruction: Secondary | ICD-10-CM | POA: Diagnosis not present

## 2015-10-21 DIAGNOSIS — K8066 Calculus of gallbladder and bile duct with acute and chronic cholecystitis without obstruction: Secondary | ICD-10-CM | POA: Diagnosis not present

## 2015-10-21 DIAGNOSIS — Z89201 Acquired absence of right upper limb, unspecified level: Secondary | ICD-10-CM | POA: Diagnosis not present

## 2015-10-21 DIAGNOSIS — K2970 Gastritis, unspecified, without bleeding: Secondary | ICD-10-CM | POA: Diagnosis not present

## 2015-10-21 DIAGNOSIS — K8020 Calculus of gallbladder without cholecystitis without obstruction: Secondary | ICD-10-CM | POA: Diagnosis not present

## 2015-10-21 LAB — URINALYSIS, ROUTINE W REFLEX MICROSCOPIC
Bilirubin Urine: NEGATIVE
Glucose, UA: 100 mg/dL — AB
KETONES UR: 15 mg/dL — AB
LEUKOCYTES UA: NEGATIVE
NITRITE: NEGATIVE
PH: 6 (ref 5.0–8.0)
Protein, ur: 100 mg/dL — AB
Specific Gravity, Urine: 1.02 (ref 1.005–1.030)

## 2015-10-21 LAB — URINE MICROSCOPIC-ADD ON

## 2015-10-21 LAB — CBC
HCT: 41.1 % (ref 39.0–52.0)
HEMOGLOBIN: 14.3 g/dL (ref 13.0–17.0)
MCH: 32.9 pg (ref 26.0–34.0)
MCHC: 34.8 g/dL (ref 30.0–36.0)
MCV: 94.5 fL (ref 78.0–100.0)
Platelets: 284 10*3/uL (ref 150–400)
RBC: 4.35 MIL/uL (ref 4.22–5.81)
RDW: 12.6 % (ref 11.5–15.5)
WBC: 13.8 10*3/uL — ABNORMAL HIGH (ref 4.0–10.5)

## 2015-10-21 LAB — COMPREHENSIVE METABOLIC PANEL
ALK PHOS: 113 U/L (ref 38–126)
ALT: 11 U/L — AB (ref 17–63)
ANION GAP: 12 (ref 5–15)
AST: 19 U/L (ref 15–41)
Albumin: 3.9 g/dL (ref 3.5–5.0)
BILIRUBIN TOTAL: 1 mg/dL (ref 0.3–1.2)
BUN: 7 mg/dL (ref 6–20)
CALCIUM: 9.9 mg/dL (ref 8.9–10.3)
CO2: 24 mmol/L (ref 22–32)
CREATININE: 0.97 mg/dL (ref 0.61–1.24)
Chloride: 96 mmol/L — ABNORMAL LOW (ref 101–111)
GFR calc non Af Amer: >60 mL/min (ref >60)
GLUCOSE: 192 mg/dL — AB (ref 65–99)
Potassium: 4.4 mmol/L (ref 3.5–5.1)
Sodium: 132 mmol/L — ABNORMAL LOW (ref 135–145)
TOTAL PROTEIN: 8.3 g/dL — AB (ref 6.5–8.1)

## 2015-10-21 LAB — SURGICAL PCR SCREEN
MRSA, PCR: NEGATIVE
Staphylococcus aureus: NEGATIVE

## 2015-10-21 LAB — LIPASE, BLOOD: Lipase: 21 U/L (ref 11–51)

## 2015-10-21 MED ORDER — METOPROLOL TARTRATE 50 MG PO TABS
50.0000 mg | ORAL_TABLET | Freq: Every day | ORAL | Status: DC
  Administered 2015-10-21 – 2015-10-25 (×5): 50 mg via ORAL
  Filled 2015-10-21 (×5): qty 1

## 2015-10-21 MED ORDER — SODIUM CHLORIDE 0.9 % IV BOLUS (SEPSIS)
500.0000 mL | Freq: Once | INTRAVENOUS | Status: AC
  Administered 2015-10-21: 500 mL via INTRAVENOUS

## 2015-10-21 MED ORDER — ONDANSETRON HCL 4 MG/2ML IJ SOLN
4.0000 mg | Freq: Once | INTRAMUSCULAR | Status: AC
  Administered 2015-10-21: 4 mg via INTRAVENOUS
  Filled 2015-10-21: qty 2

## 2015-10-21 MED ORDER — PHENYTOIN SODIUM EXTENDED 100 MG PO CAPS
300.0000 mg | ORAL_CAPSULE | Freq: Every day | ORAL | Status: DC
  Administered 2015-10-21 – 2015-10-24 (×4): 300 mg via ORAL
  Filled 2015-10-21 (×4): qty 3

## 2015-10-21 MED ORDER — ONDANSETRON 4 MG PO TBDP: 4.0000 mg | ORAL_TABLET | Freq: Four times a day (QID) | ORAL | Status: DC | PRN

## 2015-10-21 MED ORDER — FENTANYL CITRATE (PF) 100 MCG/2ML IJ SOLN
INTRAMUSCULAR | Status: AC
  Filled 2015-10-21: qty 2

## 2015-10-21 MED ORDER — SODIUM CHLORIDE 0.9 % IV SOLN
Freq: Once | INTRAVENOUS | Status: AC
  Administered 2015-10-21: 17:00:00 via INTRAVENOUS

## 2015-10-21 MED ORDER — HYDRALAZINE HCL 20 MG/ML IJ SOLN: 10.0000 mg | INTRAMUSCULAR | Status: DC | PRN

## 2015-10-21 MED ORDER — CEFEPIME HCL 1 G IJ SOLR
1.0000 g | Freq: Three times a day (TID) | INTRAMUSCULAR | Status: DC
  Administered 2015-10-22 – 2015-10-25 (×11): 1 g via INTRAVENOUS
  Filled 2015-10-21 (×14): qty 1

## 2015-10-21 MED ORDER — FAMOTIDINE IN NACL 20-0.9 MG/50ML-% IV SOLN
20.0000 mg | Freq: Two times a day (BID) | INTRAVENOUS | Status: DC
  Administered 2015-10-21 – 2015-10-23 (×4): 20 mg via INTRAVENOUS
  Filled 2015-10-21 (×5): qty 50

## 2015-10-21 MED ORDER — ACETAMINOPHEN 325 MG PO TABS
650.0000 mg | ORAL_TABLET | Freq: Four times a day (QID) | ORAL | Status: DC | PRN
  Administered 2015-10-22 (×3): 650 mg via ORAL
  Filled 2015-10-21 (×3): qty 2

## 2015-10-21 MED ORDER — HEPARIN SODIUM (PORCINE) 5000 UNIT/ML IJ SOLN
5000.0000 [IU] | Freq: Once | INTRAMUSCULAR | Status: AC
  Administered 2015-10-21: 5000 [IU] via SUBCUTANEOUS
  Filled 2015-10-21: qty 1

## 2015-10-21 MED ORDER — METRONIDAZOLE IN NACL 5-0.79 MG/ML-% IV SOLN
500.0000 mg | Freq: Three times a day (TID) | INTRAVENOUS | Status: DC
  Administered 2015-10-21 – 2015-10-25 (×9): 500 mg via INTRAVENOUS
  Filled 2015-10-21 (×14): qty 100

## 2015-10-21 MED ORDER — DIPHENHYDRAMINE HCL 12.5 MG/5ML PO ELIX: 12.5000 mg | ORAL_SOLUTION | Freq: Four times a day (QID) | ORAL | Status: DC | PRN

## 2015-10-21 MED ORDER — ONDANSETRON HCL 4 MG/2ML IJ SOLN
4.0000 mg | Freq: Four times a day (QID) | INTRAMUSCULAR | Status: DC | PRN
Start: 2015-10-21 — End: 2015-10-25
  Administered 2015-10-24: 4 mg via INTRAVENOUS
  Filled 2015-10-21: qty 2

## 2015-10-21 MED ORDER — PROMETHAZINE HCL 25 MG/ML IJ SOLN: 12.5000 mg | Freq: Four times a day (QID) | INTRAMUSCULAR | Status: DC | PRN

## 2015-10-21 MED ORDER — FENTANYL CITRATE (PF) 100 MCG/2ML IJ SOLN
100.0000 ug | Freq: Once | INTRAMUSCULAR | Status: AC
  Administered 2015-10-21: 100 ug via INTRAVENOUS
  Filled 2015-10-21: qty 2

## 2015-10-21 MED ORDER — DEXTROSE 5 % IV SOLN
1.0000 g | Freq: Once | INTRAVENOUS | Status: AC
  Administered 2015-10-21: 1 g via INTRAVENOUS
  Filled 2015-10-21: qty 1

## 2015-10-21 MED ORDER — FENTANYL CITRATE (PF) 100 MCG/2ML IJ SOLN
25.0000 ug | INTRAMUSCULAR | Status: DC | PRN
Start: 2015-10-21 — End: 2015-10-25
  Administered 2015-10-21 – 2015-10-22 (×4): 75 ug via INTRAVENOUS
  Filled 2015-10-21 (×3): qty 2

## 2015-10-21 MED ORDER — METRONIDAZOLE IN NACL 5-0.79 MG/ML-% IV SOLN
500.0000 mg | Freq: Once | INTRAVENOUS | Status: AC
  Administered 2015-10-22: 500 mg via INTRAVENOUS
  Filled 2015-10-21: qty 100

## 2015-10-21 MED ORDER — DIPHENHYDRAMINE HCL 50 MG/ML IJ SOLN: 12.5000 mg | Freq: Four times a day (QID) | INTRAMUSCULAR | Status: DC | PRN

## 2015-10-21 MED ORDER — MORPHINE SULFATE (PF) 2 MG/ML IV SOLN: 1.0000 mg | INTRAVENOUS | Status: DC | PRN

## 2015-10-21 MED ORDER — HYDRALAZINE HCL 25 MG PO TABS
25.0000 mg | ORAL_TABLET | Freq: Two times a day (BID) | ORAL | Status: DC
  Administered 2015-10-21 – 2015-10-25 (×8): 25 mg via ORAL
  Filled 2015-10-21 (×8): qty 1

## 2015-10-21 MED ORDER — MORPHINE SULFATE (PF) 4 MG/ML IV SOLN
4.0000 mg | Freq: Once | INTRAVENOUS | Status: AC
  Administered 2015-10-21: 4 mg via INTRAVENOUS
  Filled 2015-10-21: qty 1

## 2015-10-21 NOTE — ED Provider Notes (Signed)
[facility] DEPT Provider Note   CSN: [id number] Arrival date & time: 10/21/15  1145  First Provider Contact:  First MD Initiated Contact with Patient 10/21/15 1355        History   Chief Complaint Chief Complaint  Patient presents with  . Abdominal Pain    HPI Phillip Hill is a 75 y.o. male presenting with right upper quadrant abdominal pain. Patient originally seen here on 7/15 with abdominal pain, ultrasound showed cholecystitis but a height of scan was negative. He was discharged home to follow up with GI. Seen again in late in the evening of 7/26-27 and then was sent home because his pain was better after morphine. He has been taking Percocet but still having pain. Pain came back shortly after leaving and has continued. Worse this morning after eating a banana. Vomited once this morning and once when getting put into a room. Still somewhat nauseated. Pain is also in his back and worse with inspiration. Pain is severe. And once to be admitted and have his gallbladder taken out.  HPI  Past Medical History:  Diagnosis Date  . Arthritis   . Chronic cystitis   . Complex partial seizure disorder ([place])    began 1970's--  last seizure 1995  per neurologist note 10/ 2015--  dr Krista Blue  . Diverticulosis of colon   . Frequency of urination   . Hemorrhoids   . History of herpes simplex type 2 infection   . History of lower GI bleeding    secondary to ASA use--  resolved  . Hypertension   . Lazy eye of left side   . Nocturia   . PAC (premature atrial contraction)   . Seizures ([place])   . Urgency of urination     Patient Active Problem List   Diagnosis Date Noted  . Cholecystitis 10/21/2015  . Cholelithiases 10/07/2015  . Health care maintenance 09/07/2015  . Encounter to establish care 06/09/2015  . HSV-2 (herpes simplex virus 2) infection 06/09/2015  . Palpitations 10/20/2013  . SOB (shortness of breath) 10/20/2013  . Hemorrhoids   . Arthritis   . Sleep disorder   . Seizures  ([place])   . Hypertension   . Other and unspecified hyperlipidemia   . Upper limb amputation, shoulder   . Essential and other specified forms of tremor   . Diverticula of colon 10/24/2011  . GI bleeding 10/23/2011  . Acute blood loss anemia 10/21/2011  . Seizure disorder ([place]) 10/21/2011  . Dyslipidemia 10/21/2011    Past Surgical History:  Procedure Laterality Date  . ARM AMPUTATION Right 1960   infection due to injury  . COLONOSCOPY  10/24/2011   Procedure: COLONOSCOPY;  Surgeon: Milus Banister, MD;  Location: [facility];  Service: Endoscopy;  Laterality: N/A;  . CYSTOSCOPY WITH BIOPSY N/A 06/28/2014   Procedure: CYSTOSCOPY WITH BIOPSY;  Surgeon: Lowella Bandy, MD;  Location: [facility];  Service: Urology;  Laterality: N/A;  . INGUINAL HERNIA REPAIR Left 09-15-2009  . TRANSTHORACIC ECHOCARDIOGRAM  10-28-2013   dr Tressia Miners turner   mild focal basal hypertrophy of septum/  ef [id number]  grade I diastolic dysfunction/  mild AR/  trivial MR and TR       Home Medications    Prior to Admission medications   Medication Sig Start Date End Date Taking? Authorizing Provider  hydrALAZINE (APRESOLINE) 25 MG tablet Take 1 tablet (25 mg total) by mouth 2 (two) times daily. 08/23/15  Yes Sela Hua, MD  metoprolol (  LOPRESSOR) 50 MG tablet TAKE 1 TABLET (50 MG TOTAL) BY MOUTH EVERY MORNING. 08/22/15  Yes Sela Hua, MD  oxyCODONE-acetaminophen (PERCOCET) 5-325 MG tablet Take 1 tablet by mouth every 4 (four) hours as needed for moderate pain. [id number]  Yes [name], MD  phenytoin (DILANTIN) 100 MG ER capsule Take 3 capsules (300 mg total) by mouth at bedtime. 08/25/15  Yes Sela Hua, MD  valACYclovir (VALTREX) 500 MG tablet TAKE 1 TABLET (500 MG TOTAL) BY MOUTH DAILY. 09/04/15  Yes Sela Hua, MD  ondansetron (ZOFRAN ODT) 8 MG disintegrating tablet Take 1 tablet (8 mg total) by mouth every 8 (eight) hours as needed for nausea. 10/07/15   Varney Biles, MD    Family  History Family History  Problem Relation Age of Onset  . Cancer Mother   . Cancer Sister   . Cancer Brother     Social History Social History  Substance Use Topics  . Smoking status: Former Smoker    Types: Cigarettes    Quit date: 10/23/1968  . Smokeless tobacco: Never Used  . Alcohol use No     Comment: Quit alcohol in 1970     Allergies   Ciprofloxacin and Penicillins   Review of Systems Review of Systems  Constitutional: Negative for fever.  Respiratory: Negative for shortness of breath.   Cardiovascular: Negative for chest pain.  Gastrointestinal: Positive for abdominal pain, nausea and vomiting.  Musculoskeletal: Positive for back pain.  All other systems reviewed and are negative.    Physical Exam Updated Vital Signs BP 153/79   Pulse 117   Temp 99.3 F (37.4 C) (Oral)   Resp 20   SpO2 94%   Physical Exam  Constitutional: He is oriented to person, place, and time. He appears well-developed and well-nourished.  HENT:  Head: Normocephalic and atraumatic.  Right Ear: External ear normal.  Left Ear: External ear normal.  Nose: Nose normal.  Eyes: Right eye exhibits no discharge. Left eye exhibits no discharge.  Neck: Neck supple.  Cardiovascular: Normal rate, regular rhythm and normal heart sounds.   Pulmonary/Chest: Effort normal and breath sounds normal.  Abdominal: Soft. There is tenderness.  Difficult to assess Murphy's sign given he is so tender I can't get good positioning for him to take a deep breath with palpation  Musculoskeletal: He exhibits no edema.  Right arm with previous amputation  Neurological: He is alert and oriented to person, place, and time.  Skin: Skin is warm and dry. He is not diaphoretic.  Nursing note and vitals reviewed.    ED Treatments / Results  Labs (all labs ordered are listed, but only abnormal results are displayed) Labs Reviewed  COMPREHENSIVE METABOLIC PANEL - Abnormal; Notable for the following:       Result  Value   Sodium 132 (*)    Chloride 96 (*)    Glucose, Bld 192 (*)    Total Protein 8.3 (*)    ALT 11 (*)    All other components within normal limits  CBC - Abnormal; Notable for the following:    WBC 13.8 (*)    All other components within normal limits  LIPASE, BLOOD  URINALYSIS, ROUTINE W REFLEX MICROSCOPIC (NOT AT [facility])    EKG  EKG Interpretation None       Radiology US Abdomen Limited Ruq  Result Date: 10/21/2015 CLINICAL DATA:  Right upper quadrant pain. Negative HIDA scan October 07, 2015. EXAM: US ABDOMEN LIMITED - RIGHT UPPER QUADRANT COMPARISON:  HIDA scan, ultrasound, and CT scan October 07, 2015 FINDINGS: Gallbladder: Cholelithiasis is identified. The gallbladder wall measures 4.6 mm and appears edematous. There is mild tenderness over the gallbladder but no sonographic Murphy's sign. An 8 mm stone is seen in the neck of the gallbladder. There is also small amount of sludge. Common bile duct: Diameter: 3.1 mm Liver: The patient's known right hepatic lobe cyst is again identified a mildly complicated. IMPRESSION: 1. Cholelithiasis, sludge, and gallbladder wall thickening identify. No Murphy's sign or pericholecystic fluid. If there is concern for acute cholecystitis, a HIDA scan could further evaluate. Of note, the patient had a negative HIDA scan on October 07, 2015. 2. Right hepatic lobe cyst, mildly complicated. Electronically Signed   By: Dorise Bullion III M.D   On: 10/21/2015 15:29   Procedures Procedures (including critical care time)  Medications Ordered in ED Medications  ceFEPIme (MAXIPIME) 1 g in dextrose 5 % 50 mL IVPB (not administered)    And  metroNIDAZOLE (FLAGYL) IVPB 500 mg (not administered)  0.9 %  sodium chloride infusion (not administered)  morphine 4 MG/ML injection 4 mg (4 mg Intravenous Given 10/21/15 1425)  ondansetron (ZOFRAN) injection 4 mg (4 mg Intravenous Given 10/21/15 1425)  sodium chloride 0.9 % bolus 500 mL (0 mLs Intravenous Stopped 10/21/15  1523)  fentaNYL (SUBLIMAZE) injection 100 mcg (100 mcg Intravenous Given 10/21/15 1540)     Initial Impression / Assessment and Plan / ED Course  I have reviewed the triage vital signs and the nursing notes.  Pertinent labs & imaging results that were available during my care of the patient were reviewed by me and considered in my medical decision making (see chart for details).  Clinical Course  Comment By Time  WBC increased from yesterday. Will check u/s, give morhpine, zofran, fluids and keep npo. Plan to consult surgery. Sherwood Gambler, MD 07/29 1409  D/w Dr. Redmond Pulling, he will come see patient and likely admit for failed outpatient cholelithiasis. Will give antibiotics. Keep NPO. Fentanyl helped more than the morphine had. PCN reaction was itching only, no anaphylaxis. Sherwood Gambler, MD 07/29 [phone]    Surgery to admit  Final Clinical Impressions(s) / ED Diagnoses   Final diagnoses:  RUQ abdominal pain  Cholecystitis    New Prescriptions New Prescriptions   No medications on file     Sherwood Gambler, MD 10/21/15 1702

## 2015-10-21 NOTE — ED Triage Notes (Signed)
Patient arrived by [facility] with recurrent right side and RUQ abdominal pain, describes as sharp stabbing pain. No nausea. Seen yesterday for same and wants gallbladder removed

## 2015-10-21 NOTE — ED Notes (Signed)
Patient transported to Ultrasound 

## 2015-10-21 NOTE — H&P (Signed)
Phillip Hill is an 75 y.o. male.   Chief Complaint: abd pain HPI: 75 yo AAM came back to ED for ongoing abdominal pain. Just here last night and sent home. Pt originally  presented on 7/15 with abdominal pain- not RUQ at that time but periumbilical. Ct showed prominent GB, distended and perhaps some stranding. U/s showed thickened wall and stones. He was nontender at time of dr wyatt's exam and stated his abd pain was around his umbilicus. A HIDA was performed which showed no acute cholecystitis and pt sent home and encouraged to get GI w/u. He came back in c/o periumbilical pain, n/v. He states last night they gave him oral pain pills to take and after taking one his pain is now on right side under ribs and goes to his back. No nsaids. Has constipation at baseline.   Denies cp/sob/doe/pnd/tia/amaruosis fugax  Past Medical History:  Diagnosis Date  . Arthritis   . Chronic cystitis   . Complex partial seizure disorder ([place])    began 1970's--  last seizure 1995  per neurologist note 10/ 2015--  dr Krista Blue  . Diverticulosis of colon   . Frequency of urination   . Hemorrhoids   . History of herpes simplex type 2 infection   . History of lower GI bleeding    secondary to ASA use--  resolved  . Hypertension   . Lazy eye of left side   . Nocturia   . PAC (premature atrial contraction)   . Seizures ([place])   . Urgency of urination     Past Surgical History:  Procedure Laterality Date  . ARM AMPUTATION Right 1960   infection due to injury  . COLONOSCOPY  10/24/2011   Procedure: COLONOSCOPY;  Surgeon: Milus Banister, MD;  Location: [facility];  Service: Endoscopy;  Laterality: N/A;  . CYSTOSCOPY WITH BIOPSY N/A 06/28/2014   Procedure: CYSTOSCOPY WITH BIOPSY;  Surgeon: Lowella Bandy, MD;  Location: [facility];  Service: Urology;  Laterality: N/A;  . INGUINAL HERNIA REPAIR Left 09-15-2009  . TRANSTHORACIC ECHOCARDIOGRAM  10-28-2013   dr Tressia Miners turner   mild focal basal hypertrophy of septum/   ef 84-69%/  grade I diastolic dysfunction/  mild AR/  trivial MR and TR    Family History  Problem Relation Age of Onset  . Cancer Mother   . Cancer Sister   . Cancer Brother    Social History:  reports that he quit smoking about 47 years ago. His smoking use included Cigarettes. He has never used smokeless tobacco. He reports that he does not drink alcohol or use drugs.  Allergies:  Allergies  Allergen Reactions  . Ciprofloxacin Other (See Comments)    "shaking, like cold chills"  . Penicillins Hives     (Not in a hospital admission)  Results for orders placed or performed during the hospital encounter of 10/21/15 (from the past 48 hour(s))  Lipase, blood     Status: None   Collection Time: 10/21/15 12:00 PM  Result Value Ref Range   Lipase 21 11 - 51 U/L  Comprehensive metabolic panel     Status: Abnormal   Collection Time: 10/21/15 12:00 PM  Result Value Ref Range   Sodium 132 (L) 135 - 145 mmol/L   Potassium 4.4 3.5 - 5.1 mmol/L   Chloride 96 (L) 101 - 111 mmol/L   CO2 24 22 - 32 mmol/L   Glucose, Bld 192 (H) 65 - 99 mg/dL   BUN 7 6 -  20 mg/dL   Creatinine, Ser 0.97 0.61 - 1.24 mg/dL   Calcium 9.9 8.9 - 10.3 mg/dL   Total Protein 8.3 (H) 6.5 - 8.1 g/dL   Albumin 3.9 3.5 - 5.0 g/dL   AST 19 15 - 41 U/L   ALT 11 (L) 17 - 63 U/L   Alkaline Phosphatase 113 38 - 126 U/L   Total Bilirubin 1.0 0.3 - 1.2 mg/dL   GFR calc non Af Amer >60 >60 mL/min   GFR calc Af Amer >60 >60 mL/min    Comment: (NOTE) The eGFR has been calculated using the CKD EPI equation. This calculation has not been validated in all clinical situations. eGFR's persistently <60 mL/min signify possible Chronic Kidney Disease.    Anion gap 12 5 - 15  CBC     Status: Abnormal   Collection Time: 10/21/15 12:00 PM  Result Value Ref Range   WBC 13.8 (H) 4.0 - 10.5 K/uL   RBC 4.35 4.22 - 5.81 MIL/uL   Hemoglobin 14.3 13.0 - 17.0 g/dL   HCT 41.1 39.0 - 52.0 %   MCV 94.5 78.0 - 100.0 fL   MCH 32.9  26.0 - 34.0 pg   MCHC 34.8 30.0 - 36.0 g/dL   RDW 12.6 11.5 - 15.5 %   Platelets 284 150 - 400 K/uL   US Abdomen Limited Ruq  Result Date: 10/21/2015 CLINICAL DATA:  Right upper quadrant pain. Negative HIDA scan October 07, 2015. EXAM: US ABDOMEN LIMITED - RIGHT UPPER QUADRANT COMPARISON:  HIDA scan, ultrasound, and CT scan October 07, 2015 FINDINGS: Gallbladder: Cholelithiasis is identified. The gallbladder wall measures 4.6 mm and appears edematous. There is mild tenderness over the gallbladder but no sonographic Murphy's sign. An 8 mm stone is seen in the neck of the gallbladder. There is also small amount of sludge. Common bile duct: Diameter: 3.1 mm Liver: The patient's known right hepatic lobe cyst is again identified a mildly complicated. IMPRESSION: 1. Cholelithiasis, sludge, and gallbladder wall thickening identify. No Murphy's sign or pericholecystic fluid. If there is concern for acute cholecystitis, a HIDA scan could further evaluate. Of note, the patient had a negative HIDA scan on October 07, 2015. 2. Right hepatic lobe cyst, mildly complicated. Electronically Signed   By: Dorise Bullion III M.D   On: 10/21/2015 15:29   Review of Systems  Constitutional: Negative for weight loss.  HENT: Negative for nosebleeds.   Eyes: Negative for blurred vision.  Respiratory: Negative for shortness of breath.   Cardiovascular: Negative for chest pain, palpitations, orthopnea and PND.       Denies DOE  Gastrointestinal: Positive for abdominal pain, constipation, nausea and vomiting.  Genitourinary: Negative for dysuria and hematuria.  Musculoskeletal: Negative.   Skin: Negative for itching and rash.  Neurological: Negative for dizziness, focal weakness, seizures, loss of consciousness and headaches.       Denies TIAs, amaurosis fugax  Endo/Heme/Allergies: Does not bruise/bleed easily.  Psychiatric/Behavioral: The patient is not nervous/anxious.     Blood pressure 153/79, pulse 117, temperature 99.3  F (37.4 C), temperature source Oral, resp. rate 20, SpO2 94 %. Physical Exam  Vitals reviewed. Constitutional: He is oriented to person, place, and time. He appears well-developed and well-nourished. No distress.  HENT:  Head: Normocephalic and atraumatic.  Right Ear: External ear normal.  Left Ear: External ear normal.  Eyes: Conjunctivae are normal. No scleral icterus.  Neck: Normal range of motion. Neck supple. No tracheal deviation present. No thyromegaly present.  Cardiovascular:  Normal rate and normal heart sounds.   Respiratory: Effort normal and breath sounds normal. No stridor. No respiratory distress. He has no wheezes.  GI: Soft. He exhibits distension. There is tenderness in the right upper quadrant. There is positive Murphy's sign. There is no rigidity, no rebound and no guarding.    Musculoskeletal: He exhibits no edema or tenderness.       Arms: RUE amputation  Lymphadenopathy:    He has no cervical adenopathy.  Neurological: He is alert and oriented to person, place, and time. He exhibits normal muscle tone.  Skin: Skin is warm and dry. No rash noted. He is not diaphoretic. No erythema. No pallor.  Psychiatric: He has a normal mood and affect. His behavior is normal. Judgment and thought content normal.     Assessment/Plan Acute calculous cholecystitis Seizure d/o htn  I think his prior presentations were prob due to gallbladder disease. Nonetheless, he clinically has acute cholecystitis now. Did discuss that cholecystectomy may not ameliorate the periumbilical pain he was having but again I think it has prob been gallbladder all along.   I believe the patient's symptoms are consistent with gallbladder disease.  We discussed gallbladder disease.   I discussed laparoscopic cholecystectomy with IOC in detail.  The patient was shown diagrams detailing the procedure.  We discussed the risks and benefits of a laparoscopic cholecystectomy including, but not limited to  bleeding, infection, injury to surrounding structures such as the intestine or liver, bile leak, retained gallstones, need to convert to an open procedure, prolonged diarrhea, blood clots such as  DVT, common bile duct injury, anesthesia risks, and possible need for additional procedures.  We discussed the typical post-operative recovery course. I explained that the likelihood of improvement of their symptoms is good.  Did discuss that he was at prob higher risk for conversion, drain placement   IV abx NPO p MN Tentative cholecystectomy in AM or Monday Cont home meds  Leighton Ruff. Redmond Pulling, MD, [place] B[facility], [place]   Gayland Curry, MD 10/21/2015, 4:58 PM

## 2015-10-22 ENCOUNTER — Encounter (HOSPITAL_COMMUNITY): Payer: Self-pay | Admitting: Certified Registered"

## 2015-10-22 ENCOUNTER — Encounter (HOSPITAL_COMMUNITY): Admission: EM | Disposition: A | Discharge status: Home / Self Care | Payer: Self-pay | Source: Home / Self Care

## 2015-10-22 ENCOUNTER — Inpatient Hospital Stay (HOSPITAL_COMMUNITY): Payer: Medicare Other | Admitting: Anesthesiology

## 2015-10-22 ENCOUNTER — Inpatient Hospital Stay (HOSPITAL_COMMUNITY): Payer: Medicare Other

## 2015-10-22 HISTORY — PX: CHOLECYSTECTOMY: SHX55

## 2015-10-22 LAB — COMPREHENSIVE METABOLIC PANEL
ALK PHOS: 98 U/L (ref 38–126)
ALT: 10 U/L — AB (ref 17–63)
AST: 22 U/L (ref 15–41)
Albumin: 3 g/dL — ABNORMAL LOW (ref 3.5–5.0)
Anion gap: 8 (ref 5–15)
BUN: 6 mg/dL (ref 6–20)
CALCIUM: 9.2 mg/dL (ref 8.9–10.3)
CO2: 23 mmol/L (ref 22–32)
CREATININE: 0.84 mg/dL (ref 0.61–1.24)
Chloride: 100 mmol/L — ABNORMAL LOW (ref 101–111)
Glucose, Bld: 133 mg/dL — ABNORMAL HIGH (ref 65–99)
Potassium: 4.1 mmol/L (ref 3.5–5.1)
SODIUM: 131 mmol/L — AB (ref 135–145)
Total Bilirubin: 1.4 mg/dL — ABNORMAL HIGH (ref 0.3–1.2)
Total Protein: 7.6 g/dL (ref 6.5–8.1)

## 2015-10-22 LAB — CBC
HCT: 40.9 % (ref 39.0–52.0)
Hemoglobin: 14.1 g/dL (ref 13.0–17.0)
MCH: 32.7 pg (ref 26.0–34.0)
MCHC: 34.5 g/dL (ref 30.0–36.0)
MCV: 94.9 fL (ref 78.0–100.0)
PLATELETS: 258 10*3/uL (ref 150–400)
RBC: 4.31 MIL/uL (ref 4.22–5.81)
RDW: 12.7 % (ref 11.5–15.5)
WBC: 22.8 10*3/uL — ABNORMAL HIGH (ref 4.0–10.5)

## 2015-10-22 SURGERY — LAPAROSCOPIC CHOLECYSTECTOMY WITH INTRAOPERATIVE CHOLANGIOGRAM
Anesthesia: General | Site: Abdomen

## 2015-10-22 MED ORDER — LIDOCAINE 2% (20 MG/ML) 5 ML SYRINGE
INTRAMUSCULAR | Status: AC
  Filled 2015-10-22: qty 5

## 2015-10-22 MED ORDER — 0.9 % SODIUM CHLORIDE (POUR BTL) OPTIME
TOPICAL | Status: DC | PRN
  Administered 2015-10-22: 1000 mL

## 2015-10-22 MED ORDER — SUCCINYLCHOLINE CHLORIDE 200 MG/10ML IV SOSY
PREFILLED_SYRINGE | INTRAVENOUS | Status: DC | PRN
  Administered 2015-10-22: 100 mg via INTRAVENOUS

## 2015-10-22 MED ORDER — BUPIVACAINE-EPINEPHRINE (PF) 0.25% -1:200000 IJ SOLN
INTRAMUSCULAR | Status: AC
  Filled 2015-10-22: qty 30

## 2015-10-22 MED ORDER — FENTANYL CITRATE (PF) 250 MCG/5ML IJ SOLN
INTRAMUSCULAR | Status: DC | PRN
  Administered 2015-10-22: 50 ug via INTRAVENOUS
  Administered 2015-10-22: 100 ug via INTRAVENOUS

## 2015-10-22 MED ORDER — LIDOCAINE HCL 1 % IJ SOLN
INTRAMUSCULAR | Status: DC | PRN
  Administered 2015-10-22: 11:00:00 via INTRAMUSCULAR

## 2015-10-22 MED ORDER — OXYCODONE HCL 5 MG PO TABS
5.0000 mg | ORAL_TABLET | ORAL | Status: DC | PRN
  Administered 2015-10-22 – 2015-10-24 (×5): 10 mg via ORAL
  Filled 2015-10-22 (×5): qty 2

## 2015-10-22 MED ORDER — EPHEDRINE 5 MG/ML INJ
INTRAVENOUS | Status: AC
  Filled 2015-10-22: qty 10

## 2015-10-22 MED ORDER — LIDOCAINE 2% (20 MG/ML) 5 ML SYRINGE
INTRAMUSCULAR | Status: DC | PRN
  Administered 2015-10-22: 60 mg via INTRAVENOUS

## 2015-10-22 MED ORDER — ROCURONIUM BROMIDE 100 MG/10ML IV SOLN
INTRAVENOUS | Status: DC | PRN
  Administered 2015-10-22: 10 mg via INTRAVENOUS
  Administered 2015-10-22: 25 mg via INTRAVENOUS

## 2015-10-22 MED ORDER — SODIUM CHLORIDE 0.9 % IV SOLN
INTRAVENOUS | Status: DC | PRN
  Administered 2015-10-22: 100 mL

## 2015-10-22 MED ORDER — FENTANYL CITRATE (PF) 250 MCG/5ML IJ SOLN
INTRAMUSCULAR | Status: AC
  Filled 2015-10-22: qty 5

## 2015-10-22 MED ORDER — ONDANSETRON HCL 4 MG/2ML IJ SOLN
INTRAMUSCULAR | Status: DC | PRN
  Administered 2015-10-22: 4 mg via INTRAVENOUS

## 2015-10-22 MED ORDER — ENSURE ENLIVE PO LIQD
237.0000 mL | Freq: Two times a day (BID) | ORAL | Status: DC
  Administered 2015-10-23: 237 mL via ORAL

## 2015-10-22 MED ORDER — SUGAMMADEX SODIUM 200 MG/2ML IV SOLN
INTRAVENOUS | Status: DC | PRN
  Administered 2015-10-22: 200 mg via INTRAVENOUS

## 2015-10-22 MED ORDER — ONDANSETRON HCL 4 MG/2ML IJ SOLN
INTRAMUSCULAR | Status: AC
  Filled 2015-10-22: qty 2

## 2015-10-22 MED ORDER — EPHEDRINE SULFATE 50 MG/ML IJ SOLN
INTRAMUSCULAR | Status: DC | PRN
  Administered 2015-10-22: 10 mg via INTRAVENOUS

## 2015-10-22 MED ORDER — HYDROMORPHONE HCL 1 MG/ML IJ SOLN: 0.2500 mg | INTRAMUSCULAR | Status: DC | PRN

## 2015-10-22 MED ORDER — IOPAMIDOL (ISOVUE-300) INJECTION 61%
INTRAVENOUS | Status: AC
  Filled 2015-10-22: qty 50

## 2015-10-22 MED ORDER — SUGAMMADEX SODIUM 200 MG/2ML IV SOLN
INTRAVENOUS | Status: AC
  Filled 2015-10-22: qty 2

## 2015-10-22 MED ORDER — PROPOFOL 10 MG/ML IV BOLUS
INTRAVENOUS | Status: DC | PRN
  Administered 2015-10-22: 120 mg via INTRAVENOUS

## 2015-10-22 MED ORDER — PHENYLEPHRINE HCL 10 MG/ML IJ SOLN
INTRAVENOUS | Status: DC | PRN
  Administered 2015-10-22: 60 ug/min via INTRAVENOUS

## 2015-10-22 MED ORDER — PHENYLEPHRINE HCL 10 MG/ML IJ SOLN
INTRAMUSCULAR | Status: DC | PRN
  Administered 2015-10-22 (×3): 80 ug via INTRAVENOUS

## 2015-10-22 MED ORDER — ROCURONIUM BROMIDE 50 MG/5ML IV SOLN
INTRAVENOUS | Status: AC
  Filled 2015-10-22: qty 1

## 2015-10-22 MED ORDER — SODIUM CHLORIDE 0.9 % IV SOLN
INTRAVENOUS | Status: DC | PRN
  Administered 2015-10-22: 10:00:00 via INTRAVENOUS

## 2015-10-22 MED ORDER — PROPOFOL 10 MG/ML IV BOLUS
INTRAVENOUS | Status: AC
  Filled 2015-10-22: qty 20

## 2015-10-22 SURGICAL SUPPLY — 43 items
APPLIER CLIP ROT 10 11.4 M/L (STAPLE) ×3
BLADE SURG ROTATE 9660 (MISCELLANEOUS) ×3 IMPLANT
CANISTER SUCTION 2500CC (MISCELLANEOUS) ×3 IMPLANT
CHLORAPREP W/TINT 26ML (MISCELLANEOUS) ×3 IMPLANT
CLIP APPLIE ROT 10 11.4 M/L (STAPLE) ×1 IMPLANT
COVER MAYO STAND STRL (DRAPES) ×3 IMPLANT
COVER SURGICAL LIGHT HANDLE (MISCELLANEOUS) ×3 IMPLANT
DERMABOND ADHESIVE PROPEN (GAUZE/BANDAGES/DRESSINGS) ×2
DERMABOND ADVANCED (GAUZE/BANDAGES/DRESSINGS) ×2
DERMABOND ADVANCED .7 DNX12 (GAUZE/BANDAGES/DRESSINGS) ×1 IMPLANT
DERMABOND ADVANCED .7 DNX6 (GAUZE/BANDAGES/DRESSINGS) ×1 IMPLANT
DRAPE C-ARM 42X72 X-RAY (DRAPES) ×3 IMPLANT
DRAPE WARM FLUID 44X44 (DRAPE) IMPLANT
ELECT REM PT RETURN 9FT ADLT (ELECTROSURGICAL) ×3
ELECTRODE REM PT RTRN 9FT ADLT (ELECTROSURGICAL) ×1 IMPLANT
FILTER SMOKE EVAC LAPAROSHD (FILTER) IMPLANT
GLOVE BIO SURGEON STRL SZ 6 (GLOVE) ×9 IMPLANT
GLOVE BIOGEL PI IND STRL 6.5 (GLOVE) ×2 IMPLANT
GLOVE BIOGEL PI INDICATOR 6.5 (GLOVE) ×4
GOWN STRL REUS W/ TWL LRG LVL3 (GOWN DISPOSABLE) ×2 IMPLANT
GOWN STRL REUS W/TWL 2XL LVL3 (GOWN DISPOSABLE) ×3 IMPLANT
GOWN STRL REUS W/TWL LRG LVL3 (GOWN DISPOSABLE) ×4
KIT BASIN OR (CUSTOM PROCEDURE TRAY) ×3 IMPLANT
KIT ROOM TURNOVER OR (KITS) ×3 IMPLANT
L-HOOK LAP DISP 36CM (ELECTROSURGICAL) ×3
LHOOK LAP DISP 36CM (ELECTROSURGICAL) ×1 IMPLANT
NS IRRIG 1000ML POUR BTL (IV SOLUTION) ×3 IMPLANT
PAD ARMBOARD 7.5X6 YLW CONV (MISCELLANEOUS) ×3 IMPLANT
PENCIL BUTTON HOLSTER BLD 10FT (ELECTRODE) ×3 IMPLANT
POUCH SPECIMEN RETRIEVAL 10MM (ENDOMECHANICALS) ×3 IMPLANT
SCISSORS LAP 5X35 DISP (ENDOMECHANICALS) ×3 IMPLANT
SET CHOLANGIOGRAPH 5 50 .035 (SET/KITS/TRAYS/PACK) ×6 IMPLANT
SET IRRIG TUBING LAPAROSCOPIC (IRRIGATION / IRRIGATOR) ×3 IMPLANT
SLEEVE ENDOPATH XCEL 5M (ENDOMECHANICALS) ×3 IMPLANT
SPECIMEN JAR SMALL (MISCELLANEOUS) ×3 IMPLANT
SUT MNCRL AB 4-0 PS2 18 (SUTURE) ×3 IMPLANT
TOWEL OR 17X24 6PK STRL BLUE (TOWEL DISPOSABLE) ×3 IMPLANT
TOWEL OR 17X26 10 PK STRL BLUE (TOWEL DISPOSABLE) ×3 IMPLANT
TRAY LAPAROSCOPIC MC (CUSTOM PROCEDURE TRAY) ×3 IMPLANT
TROCAR XCEL BLUNT TIP 100MML (ENDOMECHANICALS) ×3 IMPLANT
TROCAR XCEL NON-BLD 11X100MML (ENDOMECHANICALS) ×3 IMPLANT
TROCAR XCEL NON-BLD 5MMX100MML (ENDOMECHANICALS) ×6 IMPLANT
TUBING INSUFFLATION (TUBING) ×3 IMPLANT

## 2015-10-22 NOTE — Interval H&P Note (Signed)
History and Physical Interval Note:  10/22/2015 10:04 AM  Phillip Hill  has presented today for surgery, with the diagnosis of cholecystitis  The various methods of treatment have been discussed with the patient and family. After consideration of risks, benefits and other options for treatment, the patient has consented to  Procedure(s): LAPAROSCOPIC CHOLECYSTECTOMY WITH INTRAOPERATIVE CHOLANGIOGRAM (N/A) as a surgical intervention .  The patient's history has been reviewed, patient examined, no change in status, stable for surgery.  I have reviewed the patient's chart and labs.  Questions were answered to the patient's satisfaction.     Maurizio Geno

## 2015-10-22 NOTE — Anesthesia Preprocedure Evaluation (Addendum)
Anesthesia Evaluation  Patient identified by MRN, date of birth, ID band Patient awake    Reviewed: Allergy & Precautions, H&P , NPO status , Patient's Chart, lab work & pertinent test results, reviewed documented beta blocker date and time   Airway Mallampati: II  TM Distance: >3 FB Neck ROM: Full    Dental no notable dental hx. (+) Edentulous Upper, Edentulous Lower, Dental Advisory Given   Pulmonary neg pulmonary ROS, former smoker,    Pulmonary exam normal breath sounds clear to auscultation       Cardiovascular hypertension, Pt. on medications and Pt. on home beta blockers  Rhythm:Regular Rate:Normal     Neuro/Psych Seizures -, Well Controlled,  negative psych ROS   GI/Hepatic negative GI ROS, Neg liver ROS,   Endo/Other  negative endocrine ROS  Renal/GU negative Renal ROS  negative genitourinary   Musculoskeletal  (+) Arthritis , Osteoarthritis,    Abdominal   Peds  Hematology negative hematology ROS (+)   Anesthesia Other Findings   Reproductive/Obstetrics negative OB ROS                            Anesthesia Physical Anesthesia Plan  ASA: II  Anesthesia Plan: General   Post-op Pain Management:    Induction: Intravenous, Rapid sequence and Cricoid pressure planned  Airway Management Planned: Oral ETT  Additional Equipment:   Intra-op Plan:   Post-operative Plan: Extubation in OR  Informed Consent: I have reviewed the patients History and Physical, chart, labs and discussed the procedure including the risks, benefits and alternatives for the proposed anesthesia with the patient or authorized representative who has indicated his/her understanding and acceptance.   Dental advisory given  Plan Discussed with: CRNA  Anesthesia Plan Comments:        Anesthesia Quick Evaluation

## 2015-10-22 NOTE — Op Note (Signed)
Laparoscopic Cholecystectomy with IOC Procedure Note  Indications: This patient presents with acute calculous cholecystitis and will undergo laparoscopic cholecystectomy.  Pre-operative Diagnosis: acute cholecystitis  Post-operative Diagnosis: gangrenous acute calculous cholecystitis  Surgeon: Stark Klein   Assistants: nk/a  Anesthesia: General endotracheal anesthesia and local  ASA Class: 2  Procedure Details  The patient was seen again in the Holding Room. The risks, benefits, complications, treatment options, and expected outcomes were discussed with the patient. The possibilities of  bleeding, recurrent infection, damage to nearby structures, the need for additional procedures, failure to diagnose a condition, the possible need to convert to an open procedure, and creating a complication requiring transfusion or operation were discussed with the patient. The likelihood of improving the patient's symptoms with return to their baseline status is good.    The patient and/or family concurred with the proposed plan, giving informed consent. The site of surgery properly noted. The patient was taken to Operating Room, and the procedure verified as Laparoscopic Cholecystectomy with Intraoperative Cholangiogram. A Time Out was held and the above information confirmed.  Prior to the induction of general anesthesia, antibiotic prophylaxis was administered. General endotracheal anesthesia was then administered and tolerated well. After the induction, the abdomen was prepped with Chloraprep and draped in the sterile fashion. The patient was positioned in the supine position.  Local anesthetic agent was injected into the skin near the umbilicus and an incision made. We dissected down to the abdominal fascia with blunt dissection.  The fascia was incised vertically and we entered the peritoneal cavity bluntly.  A pursestring suture of 0-Vicryl was placed around the fascial opening.  The Hasson cannula  was inserted and secured with the stay suture.  Pneumoperitoneum was then created with CO2 and tolerated well without any adverse changes in the patient's vital signs. An 11-mm port was placed in the subxiphoid position. The omentum was stuck to the peritoneum in the RUQ.  This was taken down bluntly easily. Two 5-mm ports were placed in the right upper quadrant. All skin incisions were infiltrated with a local anesthetic agent before making the incision and placing the trocars.   We positioned the patient in reverse Trendelenburg, tilted slightly to the patient's left.  Blunt dissection was used to located the gallbladder.  The omentum and colon were adherent to the gallbladder.  Once it was seen, it was apparent that it was frankly gangrenous on the fundus and anterior wall.  There was some bilious ascites present.  The Nezhat aspirator was used to aspirate the gallbladder.  The gallbladder was identified, the fundus grasped and retracted cephalad. Adhesions were lysed bluntly and with the electrocautery where indicated, taking care not to injure any adjacent organs or viscus. The infundibulum was grasped and retracted laterally, exposing the peritoneum overlying the triangle of Calot. This was then divided and exposed in a blunt fashion. A critical view of the cystic duct and cystic artery was obtained.  The cystic duct was clearly identified and bluntly dissected circumferentially. The cystic duct was ligated with a clip distally.   An incision was made in the cystic duct and the [facility] cholangiogram catheter introduced. The catheter was secured using a clip. A cholangiogram was then performed, demonstrating normal anatomy including hepatic ducts bilaterally.  The duodenum filled easily without evidence of filling defects.    The cystic duct was then ligated with clips and divided. The cystic artery was identified, dissected free, ligated with clips and divided as well. Four clips were placed on the  distal  cystic duct.    The gallbladder was dissected from the liver bed in retrograde fashion with the electrocautery. The gallbladder was removed and placed in an Endocatch bag.  The gallbladder and Endocatch bag were then removed through the umbilical port site.  The liver bed was irrigated and inspected. Hemostasis was achieved with the electrocautery. Copious irrigation was utilized and was repeatedly aspirated until clear.    We again inspected the right upper quadrant for hemostasis.  Pneumoperitoneum was released as we removed the trocars.   The pursestring suture was used to close the umbilical fascia.  4-0 Monocryl was used to close the skin.   The skin was cleaned and dry, and Dermabond was applied. The patient was then extubated and brought to the recovery room in stable condition. Instrument, sponge, and needle counts were correct at closure and at the conclusion of the case.   Findings: Gangrenous gallbladder with some adjacent bilious ascites..    Estimated Blood Loss: min         Drains: none          Specimens: Gallbladder to pathology       Complications: None; patient tolerated the procedure well.         Disposition: PACU - hemodynamically stable.         Condition: stable

## 2015-10-22 NOTE — Anesthesia Postprocedure Evaluation (Signed)
Anesthesia Post Note  Patient: Phillip Hill  Procedure(s) Performed: Procedure(s) (LRB): LAPAROSCOPIC CHOLECYSTECTOMY WITH INTRAOPERATIVE CHOLANGIOGRAM (N/A)  Patient location during evaluation: PACU Anesthesia Type: General Level of consciousness: awake and alert Pain management: pain level controlled Vital Signs Assessment: post-procedure vital signs reviewed and stable Respiratory status: spontaneous breathing, nonlabored ventilation, respiratory function stable and patient connected to nasal cannula oxygen Cardiovascular status: blood pressure returned to baseline and stable Postop Assessment: no signs of nausea or vomiting Anesthetic complications: no    Last Vitals:  Vitals:   10/22/15 1237 10/22/15 1314  BP: 126/78 134/69  Pulse: 98 88  Resp: (!) 24   Temp:  36.9 C    Last Pain:  Vitals:   10/22/15 1355  TempSrc:   PainSc: 6                  Jenner Rosier,W. EDMOND

## 2015-10-22 NOTE — Progress Notes (Signed)
Report called to [facility] in short stay area.

## 2015-10-22 NOTE — Anesthesia Procedure Notes (Signed)
Procedure Name: Intubation Date/Time: 10/22/2015 10:36 AM Performed by: Melina Copa, Maicee Ullman R Pre-anesthesia Checklist: Patient identified, Emergency Drugs available, Suction available and Patient being monitored Patient Re-evaluated:Patient Re-evaluated prior to inductionOxygen Delivery Method: Circle System Utilized Preoxygenation: Pre-oxygenation with 100% oxygen Intubation Type: IV induction Ventilation: Mask ventilation without difficulty Laryngoscope Size: Mac and 4 Grade View: Grade I Tube type: Oral Tube size: 7.5 mm Number of attempts: 1 Airway Equipment and Method: Stylet Placement Confirmation: ETT inserted through vocal cords under direct vision,  positive ETCO2 and breath sounds checked- equal and bilateral Secured at: 22 cm Tube secured with: Tape Dental Injury: Teeth and Oropharynx as per pre-operative assessment

## 2015-10-22 NOTE — Progress Notes (Signed)
Patient had temp of 102.4. RN reported finding to covering MD/ Dr Greer Pickerel.  Who gave verbal order for tylenol q6hrs prn

## 2015-10-22 NOTE — Transfer of Care (Signed)
Immediate Anesthesia Transfer of Care Note  Patient: Phillip Hill  Procedure(s) Performed: Procedure(s): LAPAROSCOPIC CHOLECYSTECTOMY WITH INTRAOPERATIVE CHOLANGIOGRAM (N/A)  Patient Location: PACU  Anesthesia Type:General  Level of Consciousness: awake, oriented and patient cooperative  Airway & Oxygen Therapy: Patient Spontanous Breathing and Patient connected to nasal cannula oxygen  Post-op Assessment: Report given to RN, Post -op Vital signs reviewed and stable and Patient moving all extremities  Post vital signs: Reviewed and stable  Last Vitals:  Vitals:   10/22/15 0541 10/22/15 0859  BP: (!) 124/59 133/79  Pulse: 66 (!) 126  Resp: 17   Temp: 37 C (!) 39.2 C    Last Pain:  Vitals:   10/22/15 0859  TempSrc: Oral  PainSc:          Complications: No apparent anesthesia complications

## 2015-10-23 ENCOUNTER — Encounter (HOSPITAL_COMMUNITY): Payer: Self-pay | Admitting: General Surgery

## 2015-10-23 MED ORDER — FAMOTIDINE 20 MG PO TABS
20.0000 mg | ORAL_TABLET | Freq: Two times a day (BID) | ORAL | Status: DC
  Administered 2015-10-23 – 2015-10-25 (×4): 20 mg via ORAL
  Filled 2015-10-23 (×4): qty 1

## 2015-10-23 NOTE — Progress Notes (Signed)
[facility] Progress Note  1 Day Post-Op  Subjective: Fever (102) overnight. Pain improving. Abdominal soreness with movement. Tolerating PO with some nausea. Ambulating. +flatus. No BM.   Objective: Vital signs in last 24 hours: Temp:  [98.5 F (36.9 C)-102.5 F (39.2 C)] 99.5 F (37.5 C) (07/31 0642) Pulse Rate:  [88-126] 103 (07/31 0642) Resp:  [16-24] 18 (07/31 0642) BP: (115-142)/(69-79) 127/74 (07/31 0642) SpO2:  [93 %-98 %] 95 % (07/31 0642) Weight:  [70.3 kg (155 lb)] 70.3 kg (155 lb) (07/30 1700) Last BM Date: 10/18/15  Intake/Output from previous day: 07/30 0701 - 07/31 0700 In: 1310 [P.O.:360; I.V.:500; IV Piggyback:450] Out: 750 [Urine:675; Blood:75] Intake/Output this shift: No intake/output data recorded.  PE: Gen:  Alert, NAD, pleasant Card:  RRR, no M/G/R, pedal pulses 2+BL Pulm:  CTA, no W/R/R Abd: Soft, NT, mildy distended, +BS, no HSM, incisions C/D/I Ext:  RUE amputation, No erythema, edema, or tenderness  Lab Results:   Recent Labs  10/21/15 1200 10/22/15 0419  WBC 13.8* 22.8*  HGB 14.3 14.1  HCT 41.1 40.9  PLT 284 258   BMET  Recent Labs  10/21/15 1200 10/22/15 0419  NA 132* 131*  K 4.4 4.1  CL 96* 100*  CO2 24 23  GLUCOSE 192* 133*  BUN 7 6  CREATININE 0.97 0.84  CALCIUM 9.9 9.2   PT/INR No results for input(s): LABPROT, INR in the last 72 hours. CMP     Component Value Date/Time   NA 131 (L) 10/22/2015 0419   K 4.1 10/22/2015 0419   CL 100 (L) 10/22/2015 0419   CO2 23 10/22/2015 0419   GLUCOSE 133 (H) 10/22/2015 0419   BUN 6 10/22/2015 0419   CREATININE 0.84 10/22/2015 0419   CALCIUM 9.2 10/22/2015 0419   PROT 7.6 10/22/2015 0419   ALBUMIN 3.0 (L) 10/22/2015 0419   AST 22 10/22/2015 0419   ALT 10 (L) 10/22/2015 0419   ALKPHOS 98 10/22/2015 0419   BILITOT 1.4 (H) 10/22/2015 0419   GFRNONAA >60 10/22/2015 0419   GFRAA >60 10/22/2015 0419   Lipase     Component Value Date/Time   LIPASE 21 10/21/2015  1200       Studies/Results: Dg Cholangiogram Operative  Result Date: 10/22/2015 CLINICAL DATA:  75 year old male with gangrenous acute cholecystitis. Intraoperative cholangiogram for evaluation of biliary system. EXAM: INTRAOPERATIVE CHOLANGIOGRAM TECHNIQUE: Cholangiographic images from the C-arm fluoroscopic device were submitted for interpretation post-operatively. Please see the procedural report for the amount of contrast and the fluoroscopy time utilized. COMPARISON:  10/07/2015 CT FINDINGS: The visualized CBD and intrahepatic biliary ducts are of normal caliber. A small filling defect within the right hepatic duct is noted and may represent an air bubble versus stone. No other abnormalities are identified. Contrast flows freely into the duodenum. IMPRESSION: Small filling defect within the right hepatic duct-question air bubble versus stone. Electronically Signed   By: Margarette Canada M.D.   On: 10/22/2015 13:09  US Abdomen Limited Ruq  Result Date: 10/21/2015 CLINICAL DATA:  Right upper quadrant pain. Negative HIDA scan October 07, 2015. EXAM: US ABDOMEN LIMITED - RIGHT UPPER QUADRANT COMPARISON:  HIDA scan, ultrasound, and CT scan October 07, 2015 FINDINGS: Gallbladder: Cholelithiasis is identified. The gallbladder wall measures 4.6 mm and appears edematous. There is mild tenderness over the gallbladder but no sonographic Murphy's sign. An 8 mm stone is seen in the neck of the gallbladder. There is also small amount of sludge. Common bile duct: Diameter: 3.1 mm  Liver: The patient's known right hepatic lobe cyst is again identified a mildly complicated. IMPRESSION: 1. Cholelithiasis, sludge, and gallbladder wall thickening identify. No Murphy's sign or pericholecystic fluid. If there is concern for acute cholecystitis, a HIDA scan could further evaluate. Of note, the patient had a negative HIDA scan on October 07, 2015. 2. Right hepatic lobe cyst, mildly complicated. Electronically Signed   By: Dorise Bullion III M.D   On: 10/21/2015 15:29   Anti-infectives: Anti-infectives    Start     Dose/Rate Route Frequency Ordered Stop   10/21/15 2300  ceFEPIme (MAXIPIME) 1 g in dextrose 5 % 50 mL IVPB     1 g 100 mL/hr over 30 Minutes Intravenous Every 8 hours 10/21/15 1738     10/21/15 1800  metroNIDAZOLE (FLAGYL) IVPB 500 mg     500 mg 100 mL/hr over 60 Minutes Intravenous Every 8 hours 10/21/15 1738     10/21/15 1700  ceFEPIme (MAXIPIME) 1 g in dextrose 5 % 50 mL IVPB     1 g 100 mL/hr over 30 Minutes Intravenous  Once 10/21/15 1618 10/21/15 1741   10/21/15 1630  metroNIDAZOLE (FLAGYL) IVPB 500 mg     500 mg 100 mL/hr over 60 Minutes Intravenous  Once 10/21/15 1618 10/22/15 1046       Assessment/Plan Acute calculous cholecystitis  POD#1 laparoscopic cholecystectomy (Dr. [name])   Seizure disorder - home meds (phenytoin daily) HTN - home meds, PRN hydralazine   FEN: soft diet ID: cefepime, flagyl day #2 DVT Proph: Lovenox, SCD's Plan: continue IV abx, CBC in AM    LOS: 2 days    Jill Alexanders , [facility] Surgery 10/23/2015, 8:54 AM Pager: [phone] Consults: [phone] Mon-Fri 7:00 am-4:30 pm Sat-Sun 7:00 am-11:30 am

## 2015-10-23 NOTE — Care Management Important Message (Signed)
Important Message  Patient Details  Name: Phillip Hill MRN: [id number] Date of Birth: [birth year]   Medicare Important Message Given:  Yes    Loann Quill 10/23/2015, 3:41 PM

## 2015-10-23 NOTE — Progress Notes (Signed)
Nutrition Brief Note  Patient identified on the Malnutrition Screening Tool (MST) Report  Wt Readings from Last 15 Encounters:  10/22/15 155 lb (70.3 kg)  10/20/15 155 lb (70.3 kg)  10/07/15 155 lb (70.3 kg)  09/07/15 155 lb (70.3 kg)  06/09/15 154 lb (69.9 kg)  06/28/14 157 lb (71.2 kg)  12/02/13 154 lb 8 oz (70.1 kg)  10/20/13 154 lb 12.8 oz (70.2 kg)  07/02/12 156 lb (70.8 kg)  04/27/12 165 lb (74.8 kg)  10/24/11 153 lb 6.4 oz (69.6 kg)   75 yo AAM came back to ED for ongoing abdominal pain. Just here last night and sent home. Pt originally  presented on 7/15 with abdominal pain- not RUQ at that time but periumbilical.  Pt s/p lap chole on 09/3015.   Pt sleeping soundly at time of visit. RD did not wake. Unable to perform Nutrition-Focused Physical exam at this time, due to pt covered up in multiple blankets at time of visit.   Staff reports that pt is tolerating intake well, however, continues to have nausea. Wt hx reviewed; stable has been stable > 1 year.   Body mass index is 25.02 kg/m. Patient meets criteria for normal weight range based on current BMI.   Current diet order is soft, patient is consuming approximately n/a% of meals at this time. Labs and medications reviewed.   No nutrition interventions warranted at this time. If nutrition issues arise, please consult RD.   Estell Dillinger A. Jimmye Norman, RD, LDN, CDE Pager: [phone] After hours Pager: [phone]

## 2015-10-24 LAB — CBC
HCT: 35.2 % — ABNORMAL LOW (ref 39.0–52.0)
Hemoglobin: 11.9 g/dL — ABNORMAL LOW (ref 13.0–17.0)
MCH: 31.7 pg (ref 26.0–34.0)
MCHC: 33.8 g/dL (ref 30.0–36.0)
MCV: 93.9 fL (ref 78.0–100.0)
PLATELETS: 274 10*3/uL (ref 150–400)
RBC: 3.75 MIL/uL — ABNORMAL LOW (ref 4.22–5.81)
RDW: 12.9 % (ref 11.5–15.5)
WBC: 10.3 10*3/uL (ref 4.0–10.5)

## 2015-10-24 MED ORDER — SULFAMETHOXAZOLE-TRIMETHOPRIM 800-160 MG PO TABS: 1.0000 | ORAL_TABLET | Freq: Two times a day (BID) | ORAL | 1 refills | Status: DC

## 2015-10-24 MED ORDER — METRONIDAZOLE 500 MG PO TABS: 500.0000 mg | ORAL_TABLET | Freq: Two times a day (BID) | ORAL | 0 refills | Status: DC

## 2015-10-24 NOTE — Progress Notes (Signed)
[facility] Progress Note  2 Days Post-Op  Subjective: Daughter in the room with him. No acute events overnight. Afebrile. WBC down to normal (10) today. Pain/soreness improving. Tolerating PO. +flatus. No BM. Ambulating.  Objective: Vital signs in last 24 hours: Temp:  [98.8 F (37.1 C)-100.3 F (37.9 C)] 98.8 F (37.1 C) (08/01 0427) Pulse Rate:  [97-101] 99 (08/01 0427) Resp:  [18] 18 (08/01 0427) BP: (118-131)/(61-71) 131/71 (08/01 0427) SpO2:  [94 %-95 %] 95 % (08/01 0427) Last BM Date: 10/19/15  Intake/Output from previous day: 07/31 0701 - 08/01 0700 In: 600 [P.O.:600] Out: 1700 [Urine:1700] Intake/Output this shift: Total I/O In: 650 [IV Piggyback:650] Out: -   PE: Gen:  Alert, NAD, pleasant Card:  RRR, no M/G/R  Pulm:  CTA, no W/R/R Abd: Soft, appropriately tender, moderately distended, +BS, no HSM, incisions C/D/I  Lab Results:   Recent Labs  10/22/15 0419 10/24/15 0416  WBC 22.8* 10.3  HGB 14.1 11.9*  HCT 40.9 35.2*  PLT 258 274   BMET  Recent Labs  10/21/15 1200 10/22/15 0419  NA 132* 131*  K 4.4 4.1  CL 96* 100*  CO2 24 23  GLUCOSE 192* 133*  BUN 7 6  CREATININE 0.97 0.84  CALCIUM 9.9 9.2   PT/INR No results for input(s): LABPROT, INR in the last 72 hours. CMP     Component Value Date/Time   NA 131 (L) 10/22/2015 0419   K 4.1 10/22/2015 0419   CL 100 (L) 10/22/2015 0419   CO2 23 10/22/2015 0419   GLUCOSE 133 (H) 10/22/2015 0419   BUN 6 10/22/2015 0419   CREATININE 0.84 10/22/2015 0419   CALCIUM 9.2 10/22/2015 0419   PROT 7.6 10/22/2015 0419   ALBUMIN 3.0 (L) 10/22/2015 0419   AST 22 10/22/2015 0419   ALT 10 (L) 10/22/2015 0419   ALKPHOS 98 10/22/2015 0419   BILITOT 1.4 (H) 10/22/2015 0419   GFRNONAA >60 10/22/2015 0419   GFRAA >60 10/22/2015 0419   Lipase     Component Value Date/Time   LIPASE 21 10/21/2015 1200   Studies/Results: Dg Cholangiogram Operative  Result Date: 10/22/2015 CLINICAL DATA:   75 year old male with gangrenous acute cholecystitis. Intraoperative cholangiogram for evaluation of biliary system. EXAM: INTRAOPERATIVE CHOLANGIOGRAM TECHNIQUE: Cholangiographic images from the C-arm fluoroscopic device were submitted for interpretation post-operatively. Please see the procedural report for the amount of contrast and the fluoroscopy time utilized. COMPARISON:  10/07/2015 CT FINDINGS: The visualized CBD and intrahepatic biliary ducts are of normal caliber. A small filling defect within the right hepatic duct is noted and may represent an air bubble versus stone. No other abnormalities are identified. Contrast flows freely into the duodenum. IMPRESSION: Small filling defect within the right hepatic duct-question air bubble versus stone. Electronically Signed   By: Margarette Canada M.D.   On: 10/22/2015 13:09   Anti-infectives: Anti-infectives    Start     Dose/Rate Route Frequency Ordered Stop   10/24/15 0000  metroNIDAZOLE (FLAGYL) 500 MG tablet     500 mg Oral 2 times daily with meals 10/24/15 0759     10/21/15 2300  ceFEPIme (MAXIPIME) 1 g in dextrose 5 % 50 mL IVPB     1 g 100 mL/hr over 30 Minutes Intravenous Every 8 hours 10/21/15 1738     10/21/15 1800  metroNIDAZOLE (FLAGYL) IVPB 500 mg     500 mg 100 mL/hr over 60 Minutes Intravenous Every 8 hours 10/21/15 1738     10/21/15 1700  ceFEPIme (MAXIPIME) 1 g in dextrose 5 % 50 mL IVPB     1 g 100 mL/hr over 30 Minutes Intravenous  Once 10/21/15 1618 10/21/15 1741   10/21/15 1630  metroNIDAZOLE (FLAGYL) IVPB 500 mg     500 mg 100 mL/hr over 60 Minutes Intravenous  Once 10/21/15 1618 10/22/15 1046     Assessment/Plan Acute calculous cholecystitis  POD#2 laparoscopic cholecystectomy (Dr. [name]) - gangrenous gallbladder -no BM yet, continue stool softeners/miralax  Seizure disorder - home meds (phenytoin daily) HTN - home meds, PRN hydralazine   FEN: soft diet ID: cefepime, flagyl day #3 DVT Proph: Lovenox, SCD's Plan:  continue IV abx and stool softeners, re-check in the afternoon for possible discharge. Would continue PO abx for 7 days at discharge   LOS: 3 days    Jill Alexanders , [facility] Surgery 10/24/2015, 8:28 AM Pager: [phone] Consults: [phone] Mon-Fri 7:00 am-4:30 pm Sat-Sun 7:00 am-11:30 am

## 2015-10-24 NOTE — Progress Notes (Signed)
Had one episode of bladder incontinence today. Lost a large volume of urine in the bed and did not realize he passed urine until he got out of bed. Know history of enlarged prostate with increased frequency. Reports no issues with incontinence in the past. Denies dysuria/hematuria.  Denies loss of sensation. Has a urologist that he sees regularly.   GU exam normal - no penile/scrotal edema. Non-tender. No erythema. External meatus visible without discharge.   Obie Dredge, PA-C [facility] Pager: [phone] Consults: [phone] Mon-Fri 7:00 am-4:30 pm Sat-Sun 7:00 am-11:30 am

## 2015-10-24 NOTE — Discharge Summary (Signed)
[facility] Discharge Summary   Patient ID: Phillip Hill MRN: [id number] DOB/AGE: [birth year] 75 y.o.  Admit date: 10/21/2015 Discharge date: 10/25/2015  Admitting Diagnosis: Cholecystitis Cholelithiasis  Discharge Diagnosis Patient Active Problem List   Diagnosis Date Noted  . Cholecystitis 10/21/2015  . Cholelithiases 10/07/2015  . Health care maintenance 09/07/2015  . Encounter to establish care 06/09/2015  . HSV-2 (herpes simplex virus 2) infection 06/09/2015  . Palpitations 10/20/2013  . SOB (shortness of breath) 10/20/2013  . Hemorrhoids   . Arthritis   . Sleep disorder   . Seizures ([place])   . Hypertension   . Other and unspecified hyperlipidemia   . Upper limb amputation, shoulder   . Essential and other specified forms of tremor   . Diverticula of colon 10/24/2011  . GI bleeding 10/23/2011  . Acute blood loss anemia 10/21/2011  . Seizure disorder ([place]) 10/21/2011  . Dyslipidemia 10/21/2011    Consultants None  Imaging: - RUQ ultrasound: cholelithiasis, sludge, gallbladder wall thickening.   - DG cholangiogram operative: small filling defect within right hepatic duct - air bubble vs stone.   Procedures Dr. Normajean Glasgow (10/22/15) - Laparoscopic Cholecystectomy with [facility]  Hospital Course:  75 y/o AA male with recurrent abdominal pain beginning 7/15 who presented to [facility] with worsening pain and nausea/vomiting  Imaging significant for above findings..  Patient was admitted and underwent procedure listed above. Found to have a gangrenous gallbladder during his surgery. Tolerated procedure well and was transferred to the floor and started on IV cefepime and flagyl.  Diet was advanced as tolerated.  Leukocytosis resolved. On POD#3, the patient was voiding well, tolerating diet, ambulating well, pain well controlled, vital signs stable, incisions c/d/i and felt stable for discharge home.  Patient will follow up in our office in 2 weeks and knows to call with  questions or concerns.    Medication List    TAKE these medications   hydrALAZINE 25 MG tablet Commonly known as:  APRESOLINE Take 1 tablet (25 mg total) by mouth 2 (two) times daily.   metoprolol 50 MG tablet Commonly known as:  LOPRESSOR TAKE 1 TABLET (50 MG TOTAL) BY MOUTH EVERY MORNING.   ondansetron 8 MG disintegrating tablet Commonly known as:  ZOFRAN ODT Take 1 tablet (8 mg total) by mouth every 8 (eight) hours as needed for nausea.   oxyCODONE-acetaminophen 5-325 MG tablet Commonly known as:  PERCOCET Take 1 tablet by mouth every 4 (four) hours as needed for moderate pain.   phenytoin 100 MG ER capsule Commonly known as:  DILANTIN Take 3 capsules (300 mg total) by mouth at bedtime.   sulfamethoxazole-trimethoprim 800-160 MG tablet Commonly known as:  BACTRIM DS,SEPTRA DS Take 1 tablet by mouth 2 (two) times daily.   valACYclovir 500 MG tablet Commonly known as:  VALTREX TAKE 1 TABLET (500 MG TOTAL) BY MOUTH DAILY.        Follow-up [facility], [place]. Schedule an appointment as soon as possible for a visit on 11/08/2015.   Specialty:  General Surgery Why:  for follow-up after your laparoscopic gallbaldder removal. Your appointment is at 9:30 AM, please arrive 30 minutes early. Contact information: [address] [place] [phone]          Signed: Obie Dredge, [facility] 10/25/2015, 8:10 AM Pager: [phone] Consults: [phone] Mon-Fri 7:00 am-4:30 pm Sat-Sun 7:00 am-11:30 am

## 2015-10-24 NOTE — Discharge Instructions (Signed)

## 2015-10-25 MED ORDER — OXYCODONE HCL 5 MG PO TABS: 5.0000 mg | ORAL_TABLET | ORAL | 0 refills | Status: DC | PRN

## 2015-10-25 MED ORDER — SULFAMETHOXAZOLE-TRIMETHOPRIM 800-160 MG PO TABS: 1.0000 | ORAL_TABLET | Freq: Two times a day (BID) | ORAL | 1 refills | Status: DC

## 2015-10-25 NOTE — Progress Notes (Signed)
AVS given to patient. IV removed. Belongings packed. Transportation arranged with family. Understanding verbalized.

## 2015-11-20 ENCOUNTER — Other Ambulatory Visit: Payer: Self-pay | Admitting: Internal Medicine

## 2015-11-23 ENCOUNTER — Other Ambulatory Visit: Payer: Self-pay | Admitting: Internal Medicine

## 2015-11-23 DIAGNOSIS — I10 Essential (primary) hypertension: Secondary | ICD-10-CM

## 2015-11-28 ENCOUNTER — Ambulatory Visit (INDEPENDENT_AMBULATORY_CARE_PROVIDER_SITE_OTHER): Payer: Medicare Other | Admitting: Internal Medicine

## 2015-11-28 ENCOUNTER — Encounter: Payer: Self-pay | Admitting: Internal Medicine

## 2015-11-28 DIAGNOSIS — I10 Essential (primary) hypertension: Secondary | ICD-10-CM | POA: Diagnosis present

## 2015-11-28 DIAGNOSIS — Z0000 Encounter for general adult medical examination without abnormal findings: Secondary | ICD-10-CM

## 2015-11-28 DIAGNOSIS — B009 Herpesviral infection, unspecified: Secondary | ICD-10-CM | POA: Diagnosis not present

## 2015-11-28 MED ORDER — VALACYCLOVIR HCL 500 MG PO TABS: ORAL_TABLET | ORAL | 2 refills | Status: DC

## 2015-11-28 NOTE — Progress Notes (Signed)
   [facility] Phone: [phone]  Subjective:  HTN: Not checking blood pressures at home. Taking Hydralazine 25mg  daily and Lopressor 50mg  daily. No side effects. No chest pain, no lower extremity edema, no shortness of breath.   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 Has taken Valtrex daily since then. No side effects.  Health care maintenance: Pt refusing vaccines today. States he just wants to "ride it out".   ROS: See HPI for pertinent positives and negatives Past Medical History- HTN, seizure disorder, HLD, s/p cholecystectomy 09/2015. Reviewed problem list.  Medications- reviewed and updated Current Outpatient Prescriptions  Medication Sig Dispense Refill  . hydrALAZINE (APRESOLINE) 25 MG tablet TAKE 1 TABLET BY MOUTH TWICE A DAY 60 tablet 2  . metoprolol (LOPRESSOR) 50 MG tablet TAKE 1 TABLET (50 MG TOTAL) BY MOUTH EVERY MORNING. 30 tablet 2  . phenytoin (DILANTIN) 100 MG ER capsule Take 3 capsules (300 mg total) by mouth at bedtime. 300 capsule 3  . valACYclovir (VALTREX) 500 MG tablet TAKE 1 TABLET (500 MG TOTAL) BY MOUTH DAILY. 30 tablet 2   No current facility-administered medications for this visit.    Chief complaint-noted Family history reviewed for today's visit. No changes. Social history- patient is a former smoker. Quit in 1970.  Objective: BP (!) 158/87 (BP Location: Left Arm, Patient Position: Sitting, Cuff Size: Normal)   Pulse 86   Temp 98.3 F (36.8 C) (Oral)   Wt 148 lb (67.1 kg)   BMI 23.89 kg/m  Gen: NAD, alert, cooperative with exam HEENT: NCAT, EOMI, MMM CV: RRR, no murmur Resp: CTABL, no wheezes, normal work of breathing GI: SNTND, BS present, no guarding or organomegaly Msk: No edema, warm, normal tone, moves UE/LE spontaneously Neuro: Alert and oriented, no gross deficits  Assessment/Plan: Hypertension: Well-controlled. BP 132/72 today. - Continue Hydralazine 25mg  daily and Lopressor 50mg  daily -  Follow-up in 6 months  HSV-2: Has not had an outbreak in 9 years. - Continue Valtrex 500mg  daily.  Health Care Maintenance: Discussed Pt's need for multiple vaccines- flu, Tdap, Zostavax, and PCV. Discussed benefits of vaccines. Pt refused. - Will continue to discuss at subsequent visits.   [name], MD PGY-2

## 2015-11-28 NOTE — Patient Instructions (Signed)
It was so nice to see you!  Everything looks normal today. Please continue to take your blood pressure medications as prescribed.  We will see you back in 6 months for a blood pressure follow-up visit. Please let us know if you need anything before then!  -Dr. Brett Albino

## 2015-11-28 NOTE — Assessment & Plan Note (Signed)
Has not had an outbreak in 9 years. - Continue Valtrex 500mg  daily.

## 2015-11-28 NOTE — Assessment & Plan Note (Signed)
Well-controlled. BP 132/72 today. - Continue Hydralazine 25mg  daily and Lopressor 50mg  daily - Follow-up in 6 months

## 2015-11-28 NOTE — Assessment & Plan Note (Signed)
Discussed Pt's need for multiple vaccines- flu, Tdap, Zostavax, and PCV. Discussed benefits of vaccines. Pt refused. - Will continue to discuss at subsequent visits.

## 2016-01-18 NOTE — Telephone Encounter (Signed)
Error

## 2016-02-16 ENCOUNTER — Other Ambulatory Visit: Payer: Self-pay | Admitting: Internal Medicine

## 2016-02-16 DIAGNOSIS — I10 Essential (primary) hypertension: Secondary | ICD-10-CM

## 2016-03-05 ENCOUNTER — Other Ambulatory Visit: Payer: Self-pay | Admitting: Internal Medicine

## 2016-04-19 ENCOUNTER — Encounter (HOSPITAL_COMMUNITY): Payer: Self-pay | Admitting: Emergency Medicine

## 2016-04-19 ENCOUNTER — Emergency Department (HOSPITAL_COMMUNITY): Payer: Medicare Other

## 2016-04-19 ENCOUNTER — Emergency Department (HOSPITAL_COMMUNITY)
Admission: EM | Admit: 2016-04-19 | Discharge: 2016-04-19 | Disposition: A | Discharge status: Home / Self Care | Payer: Medicare Other | Attending: Emergency Medicine | Admitting: Emergency Medicine

## 2016-04-19 DIAGNOSIS — R0602 Shortness of breath: Secondary | ICD-10-CM | POA: Diagnosis not present

## 2016-04-19 DIAGNOSIS — R110 Nausea: Secondary | ICD-10-CM | POA: Diagnosis not present

## 2016-04-19 DIAGNOSIS — Z87891 Personal history of nicotine dependence: Secondary | ICD-10-CM | POA: Insufficient documentation

## 2016-04-19 DIAGNOSIS — I10 Essential (primary) hypertension: Secondary | ICD-10-CM | POA: Diagnosis not present

## 2016-04-19 DIAGNOSIS — R069 Unspecified abnormalities of breathing: Secondary | ICD-10-CM | POA: Diagnosis not present

## 2016-04-19 LAB — BASIC METABOLIC PANEL
Anion gap: 9 (ref 5–15)
BUN: 6 mg/dL (ref 6–20)
CHLORIDE: 105 mmol/L (ref 101–111)
CO2: 24 mmol/L (ref 22–32)
Calcium: 9.4 mg/dL (ref 8.9–10.3)
Creatinine, Ser: 0.92 mg/dL (ref 0.61–1.24)
GFR calc Af Amer: >60 mL/min (ref >60)
GFR calc non Af Amer: >60 mL/min (ref >60)
GLUCOSE: 134 mg/dL — AB (ref 65–99)
POTASSIUM: 3.6 mmol/L (ref 3.5–5.1)
Sodium: 138 mmol/L (ref 135–145)

## 2016-04-19 LAB — CBC WITH DIFFERENTIAL/PLATELET
BASOS PCT: 1 %
Basophils Absolute: 0 10*3/uL (ref 0.0–0.1)
Eosinophils Absolute: 0.2 10*3/uL (ref 0.0–0.7)
Eosinophils Relative: 3 %
HEMATOCRIT: 39.7 % (ref 39.0–52.0)
Hemoglobin: 13.7 g/dL (ref 13.0–17.0)
LYMPHS PCT: 56 %
Lymphs Abs: 2.8 10*3/uL (ref 0.7–4.0)
MCH: 32.5 pg (ref 26.0–34.0)
MCHC: 34.5 g/dL (ref 30.0–36.0)
MCV: 94.1 fL (ref 78.0–100.0)
MONO ABS: 0.4 10*3/uL (ref 0.1–1.0)
MONOS PCT: 8 %
NEUTROS ABS: 1.6 10*3/uL — AB (ref 1.7–7.7)
Neutrophils Relative %: 32 %
Platelets: 223 10*3/uL (ref 150–400)
RBC: 4.22 MIL/uL (ref 4.22–5.81)
RDW: 13.3 % (ref 11.5–15.5)
WBC: 5 10*3/uL (ref 4.0–10.5)

## 2016-04-19 LAB — I-STAT TROPONIN, ED
Troponin i, poc: 0 ng/mL (ref 0.00–0.08)
Troponin i, poc: 0 ng/mL (ref 0.00–0.08)

## 2016-04-19 NOTE — ED Notes (Signed)
RN called to check on status of BMP; Lab states BMP label was placed on blue top tube; will recollect

## 2016-04-19 NOTE — ED Provider Notes (Signed)
[facility] DEPT Provider Note   CSN: [id number] Arrival date & time: 04/19/16  0349     History   Chief Complaint Chief Complaint  Patient presents with  . Shortness of Breath  . Nausea    HPI Phillip Hill is a 76 y.o. male.  HPI  76 year old male presents with transient nausea and shortness of breath. Patient states he woke up around 2:30 AM with nausea and some mild dyspnea. He states he had just woken up from "a crazy dream". He does not remember what was in the dream. The nausea lasted until he was given an IV medicine by EMS (not recorded by nurse, unclear if given or what). Nausea and shorts of breath when away shortly after this. He has had a mild cough for the last couple days. However before tonight he has not any shortness of breath. There was no chest pain. He has not had leg swelling recently. He has not been ill recently. Currently feels well with no complaints.  Past Medical History:  Diagnosis Date  . Arthritis   . Chronic cystitis   . Complex partial seizure disorder ([place])    began 1970's--  last seizure 1995  per neurologist note 10/ 2015--  dr Krista Blue  . Diverticulosis of colon   . Frequency of urination   . Hemorrhoids   . History of herpes simplex type 2 infection   . History of lower GI bleeding    secondary to ASA use--  resolved  . Hypertension   . Lazy eye of left side   . Nocturia   . PAC (premature atrial contraction)   . Seizures ([place])   . Urgency of urination     Patient Active Problem List   Diagnosis Date Noted  . Health care maintenance 09/07/2015  . HSV-2 (herpes simplex virus 2) infection 06/09/2015  . Hemorrhoids   . Arthritis   . Sleep disorder   . Seizures ([place])   . Hypertension   . Upper limb amputation, shoulder ([place])   . Essential and other specified forms of tremor   . Diverticula of colon 10/24/2011  . Seizure disorder ([place]) 10/21/2011  . Dyslipidemia 10/21/2011    Past Surgical History:  Procedure Laterality Date  .  ARM AMPUTATION Right 1960   infection due to injury  . CHOLECYSTECTOMY N/A 10/22/2015   Procedure: LAPAROSCOPIC CHOLECYSTECTOMY WITH INTRAOPERATIVE CHOLANGIOGRAM;  Surgeon: Stark Klein, MD;  Location: [facility];  Service: General;  Laterality: N/A;  . COLONOSCOPY  10/24/2011   Procedure: COLONOSCOPY;  Surgeon: Milus Banister, MD;  Location: [facility];  Service: Endoscopy;  Laterality: N/A;  . CYSTOSCOPY WITH BIOPSY N/A 06/28/2014   Procedure: CYSTOSCOPY WITH BIOPSY;  Surgeon: Lowella Bandy, MD;  Location: [facility];  Service: Urology;  Laterality: N/A;  . INGUINAL HERNIA REPAIR Left 09-15-2009  . TRANSTHORACIC ECHOCARDIOGRAM  10-28-2013   dr Tressia Miners turner   mild focal basal hypertrophy of septum/  ef [id number]  grade I diastolic dysfunction/  mild AR/  trivial MR and TR       Home Medications    Prior to Admission medications   Medication Sig Start Date End Date Taking? Authorizing Provider  hydrALAZINE (APRESOLINE) 25 MG tablet TAKE 1 TABLET BY MOUTH TWICE A DAY 02/19/16  Yes Sela Hua, MD  metoprolol (LOPRESSOR) 50 MG tablet TAKE 1 TABLET (50 MG TOTAL) BY MOUTH EVERY MORNING. 02/19/16  Yes Sela Hua, MD  phenytoin (DILANTIN) 100 MG ER capsule Take 3 capsules (  300 mg total) by mouth at bedtime. 08/25/15  Yes Sela Hua, MD  valACYclovir (VALTREX) 500 MG tablet TAKE 1 TABLET (500 MG TOTAL) BY MOUTH DAILY. 03/05/16  Yes Sela Hua, MD    Family History Family History  Problem Relation Age of Onset  . Cancer Mother   . Cancer Sister   . Cancer Brother     Social History Social History  Substance Use Topics  . Smoking status: Former Smoker    Types: Cigarettes    Quit date: 10/23/1968  . Smokeless tobacco: Never Used  . Alcohol use No     Comment: Quit alcohol in 1970     Allergies   Ciprofloxacin and Penicillins   Review of Systems Review of Systems  Constitutional: Negative for fever.  Respiratory: Positive for cough and shortness of breath.     Cardiovascular: Negative for chest pain and leg swelling.  Gastrointestinal: Positive for nausea. Negative for abdominal pain and vomiting.  All other systems reviewed and are negative.    Physical Exam Updated Vital Signs BP 130/91   Pulse 104   Temp 98.6 F (37 C) (Oral)   Resp 15   Ht 5\' 8"  (1.727 m)   Wt 148 lb (67.1 kg)   SpO2 97%   BMI 22.50 kg/m   Physical Exam  Constitutional: He is oriented to person, place, and time. He appears well-developed and well-nourished. No distress.  HENT:  Head: Normocephalic and atraumatic.  Right Ear: External ear normal.  Left Ear: External ear normal.  Nose: Nose normal.  Eyes: Right eye exhibits no discharge. Left eye exhibits no discharge.  Neck: Neck supple.  Cardiovascular: Normal rate, regular rhythm and normal heart sounds.   Pulmonary/Chest: Effort normal and breath sounds normal. He has no wheezes. He has no rales.  Abdominal: Soft. He exhibits no distension. There is no tenderness.  Musculoskeletal: He exhibits no edema.  Neurological: He is alert and oriented to person, place, and time.  Skin: Skin is warm and dry. He is not diaphoretic.  Nursing note and vitals reviewed.    ED Treatments / Results  Labs (all labs ordered are listed, but only abnormal results are displayed) Labs Reviewed  CBC WITH DIFFERENTIAL/PLATELET - Abnormal; Notable for the following:       Result Value   Neutro Abs 1.6 (*)    All other components within normal limits  BASIC METABOLIC PANEL - Abnormal; Notable for the following:    Glucose, Bld 134 (*)    All other components within normal limits  I-STAT TROPOININ, ED  I-STAT TROPOININ, ED    EKG  EKG Interpretation  Date/Time:  Friday April 19 2016 04:06:11 EST Ventricular Rate:  87 PR Interval:    QRS Duration: 85 QT Interval:  330 QTC Calculation: 397 R Axis:   35 Text Interpretation:  Sinus rhythm Prolonged PR interval no acute ST/T changes no significant change since 2015  Confirmed by Ruble Buttler MD, Caz Weaver [id number]) on 04/19/2016 4:57:26 AM       Radiology Dg Chest 2 View  Result Date: 04/19/2016 CLINICAL DATA:  Initial evaluation for sudden onset shortness breath, nausea. EXAM: CHEST  2 VIEW COMPARISON:  Prior radiograph from 07/26/2013. FINDINGS: The cardiac and mediastinal silhouettes are stable in size and contour, and remain within normal limits. Aortic atherosclerosis noted. The lungs are normally inflated. No airspace consolidation, pleural effusion, or pulmonary edema is identified. There is no pneumothorax. No acute osseous abnormality identified. IMPRESSION: 1. No active cardiopulmonary  disease. 2. Aortic atherosclerosis. Electronically Signed   By: Jeannine Boga M.D.   On: 04/19/2016 05:25    Procedures Procedures (including critical care time)  Medications Ordered in ED Medications - No data to display   Initial Impression / Assessment and Plan / ED Course  I have reviewed the triage vital signs and the nursing notes.  Pertinent labs & imaging results that were available during my care of the patient were reviewed by me and considered in my medical decision making (see chart for details).  Clinical Course as of Apr 19 738  Fri Apr 19, 2016  0413 All symptoms resolved after being given anti-emetic by EMS. Will observe, check labs, CXR, ECG. I think ACS, PE are of low likelihood. Possibly all from nausea from the dream.  [SG]    Clinical Course User Index [SG] Sherwood Gambler, MD    Labs, ECG and CXR are reassuring. Highly doubt ACS, PE. No further symptoms since treatment by EMS. Possibly related to waking up all of a sudden from this dream. At this time he appears stable for discharge, discussed return precautions.  Final Clinical Impressions(s) / ED Diagnoses   Final diagnoses:  Nausea in adult  Shortness of breath    New Prescriptions New Prescriptions   No medications on file     Sherwood Gambler, MD 04/19/16 [phone]

## 2016-04-19 NOTE — ED Triage Notes (Signed)
Pt in from home via [facility] after waking at 0245 with sudden nausea and sob. Pt alert, VSS, denies cp, fevers or dizziness. Hx of HTN, irreg heart beat, seizures.

## 2016-05-04 ENCOUNTER — Other Ambulatory Visit: Payer: Self-pay | Admitting: Internal Medicine

## 2016-05-04 DIAGNOSIS — I10 Essential (primary) hypertension: Secondary | ICD-10-CM

## 2016-05-27 ENCOUNTER — Encounter: Payer: Self-pay | Admitting: Internal Medicine

## 2016-05-27 ENCOUNTER — Telehealth: Payer: Self-pay

## 2016-05-27 ENCOUNTER — Ambulatory Visit (INDEPENDENT_AMBULATORY_CARE_PROVIDER_SITE_OTHER): Payer: Medicare Other | Admitting: Internal Medicine

## 2016-05-27 VITALS — BP 120/80 | HR 74 | Temp 98.5°F | Ht 68.0 in | Wt 158.8 lb

## 2016-05-27 DIAGNOSIS — I10 Essential (primary) hypertension: Secondary | ICD-10-CM

## 2016-05-27 DIAGNOSIS — E785 Hyperlipidemia, unspecified: Secondary | ICD-10-CM

## 2016-05-27 DIAGNOSIS — Z0000 Encounter for general adult medical examination without abnormal findings: Secondary | ICD-10-CM | POA: Diagnosis not present

## 2016-05-27 DIAGNOSIS — R739 Hyperglycemia, unspecified: Secondary | ICD-10-CM | POA: Diagnosis present

## 2016-05-27 DIAGNOSIS — B009 Herpesviral infection, unspecified: Secondary | ICD-10-CM | POA: Diagnosis not present

## 2016-05-27 DIAGNOSIS — D179 Benign lipomatous neoplasm, unspecified: Secondary | ICD-10-CM

## 2016-05-27 LAB — POCT GLYCOSYLATED HEMOGLOBIN (HGB A1C): Hemoglobin A1C: 5.5

## 2016-05-27 MED ORDER — METOPROLOL TARTRATE 50 MG PO TABS: ORAL_TABLET | ORAL | 0 refills | Status: DC

## 2016-05-27 MED ORDER — VALACYCLOVIR HCL 500 MG PO TABS: ORAL_TABLET | ORAL | 1 refills | Status: DC

## 2016-05-27 MED ORDER — PHENYTOIN SODIUM EXTENDED 100 MG PO CAPS: 300.0000 mg | ORAL_CAPSULE | Freq: Every day | ORAL | 1 refills | Status: DC

## 2016-05-27 NOTE — Telephone Encounter (Signed)
Called patient per Dr. Brett Albino and informed him that he does not have diabetes and his A1C was 5.5.  Patient was happy to hear his results.Ozella Almond

## 2016-05-27 NOTE — Patient Instructions (Signed)
It was so nice to see you!  I have refilled your medications and sent them into your pharmacy.  We talked about started a medication called Lipitor today to help decrease your risk of future heart attacks or strokes. We decided to hold off on starting this medication.   We will check you for diabetes today. I will call you with these results.  -Dr. Brett Albino

## 2016-05-27 NOTE — Assessment & Plan Note (Deleted)
Left hip- measuring ~10cm x ~5cm

## 2016-05-27 NOTE — Progress Notes (Signed)
   [facility] Phone: [phone]  Subjective:  Phillip Hill is a 76 year old male presenting to clinic for follow-up of his chronic medical conditions.  HTN: Tolerating BP meds with no issues. No side effects. Does not check BPs at home. No chest pain, no SOB, no swelling, no dizziness.   Hyperglycemia: Was recently seen in the ED for abdominal pain. Blood glucose was 134 on BMP. No polyuria, no polydipsia.   Left hip mass: Has been there for years. Not painful. Has not been growing. States that it "causes his clothes to not fit right". No overlying skin changes, no drainage. Has been told by his last doctor that this was a lipoma and there was nothing to worry about.  HSV-2: Has had this for multiple years. Tolerating Valtrex without any issues. No side effects. Has not had an outbreak in 10 years.  Dyslipidemia: Not taking any meds for this. Does not want to start any new medications.  ROS: See HPI for pertinent positives and negatives  Past Medical History- HTN, seizure disorder, HLD. HSV-2  Family history reviewed for today's visit. No changes.  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 Neuro: Alert and oriented, no gross deficits Skin: Large, soft, homogeneous 10cm x 5 cm mass over left hip, no overlying skin changes Psych: Appropriate behavior  Assessment/Plan: HTN: Well-controlled. BP 120/80 in clinic today. - Continue Lopressor 50mg  daily and Hydralazine 25mg  bid - Follow-up in 6 months  Hyperglycemia: Recently seen  in the ED and BMET with glucose of 134. Last A1c from 2011 was 6.0%.  - Check A1c today  Left Hip Lipoma: Very benign-appearing. Has been there for years and has not changed in appearance - Continue to monitor  HSV-2: Well controlled on Valtrex. No outbreak in ~10 years. - Refill Valtrex  Dyslipidemia: Last lipid panel with Chol 153, HDL 57, LDL 75, TG 105. ASCVD risk score is 21.6%. We discussed in detail that patient should be started on aspirin and statin for decrease risk of future heart attacks and strokes. We discussed the risks and benefits of starting these medications. Patient states that he does not want to take these medications. He states that he is "fine with those risks" and states that he will need to die of something. Will continue these discussions in the future.  [name], MD PGY-2

## 2016-05-27 NOTE — Assessment & Plan Note (Signed)
Very benign-appearing. Has been there for years and has not changed in appearance - Continue to monitor

## 2016-05-27 NOTE — Assessment & Plan Note (Addendum)
Well-controlled. BP 120/80 in clinic today. - Continue Lopressor 50mg  daily and Hydralazine 25mg  bid - Follow-up in 6 months

## 2016-05-27 NOTE — Assessment & Plan Note (Signed)
Recently seen in the ED and BMET with glucose of 134. Last A1c from 2011 was 6.0%.  - Check A1c today

## 2016-05-27 NOTE — Assessment & Plan Note (Signed)
Well controlled on Valtrex. No outbreak in ~10 years. - Refill Valtrex

## 2016-05-28 NOTE — Assessment & Plan Note (Signed)
Last lipid panel with Chol 153, HDL 57, LDL 75, TG 105. ASCVD risk score is 21.6%. We discussed in detail that patient should be started on aspirin and statin for decrease risk of future heart attacks and strokes. We discussed the risks and benefits of starting these medications. Patient states that he does not want to take these medications. He states that he is "fine with those risks" and states that he will need to die of something. Will continue these discussions in the future.

## 2016-08-19 ENCOUNTER — Other Ambulatory Visit: Payer: Self-pay | Admitting: Internal Medicine

## 2016-08-19 DIAGNOSIS — I10 Essential (primary) hypertension: Secondary | ICD-10-CM

## 2016-10-03 ENCOUNTER — Telehealth: Payer: Self-pay | Admitting: Internal Medicine

## 2016-10-03 ENCOUNTER — Other Ambulatory Visit: Payer: Self-pay | Admitting: Internal Medicine

## 2016-10-03 NOTE — Telephone Encounter (Signed)
Daughter called, stated pt has a metal taste in his mouth. Pt would like a nurse to call him at home. ep

## 2016-10-03 NOTE — Telephone Encounter (Signed)
Returned call, no answer and machine had not been set up yet.  Will await callback.  Of note, pt is on Dilantin.  This may be a cause of the metallic taste. Arshia Rondon, Salome Spotted, CMA

## 2016-10-03 NOTE — Telephone Encounter (Signed)
Spoke with patient regarding metal taste in his mouth.  Advised patient that Dilantin can have a metallic taste.  Patient stated he does not think it is coming from his medication because he has been on it for years.  Patient reported that he has small blisters or bumps inside his mouth.  Appointment scheduled for tomorrow at 2:10 PM.  Will forward to PCP.  Derl Barrow, RN

## 2016-10-04 ENCOUNTER — Encounter: Payer: Self-pay | Admitting: Internal Medicine

## 2016-10-04 ENCOUNTER — Ambulatory Visit (INDEPENDENT_AMBULATORY_CARE_PROVIDER_SITE_OTHER): Payer: Medicare Other | Admitting: Internal Medicine

## 2016-10-04 DIAGNOSIS — R438 Other disturbances of smell and taste: Secondary | ICD-10-CM | POA: Diagnosis not present

## 2016-10-04 MED ORDER — MAGIC MOUTHWASH: 5.0000 mL | Freq: Three times a day (TID) | ORAL | 0 refills | Status: DC | PRN

## 2016-10-04 NOTE — Progress Notes (Signed)
   [name], MD Phone: [phone]  Reason For Visit: SDA for Metallic taste in Mouth   # Bad/Metallic taste in Mouth  - taste like metal  - Had some ulcers in the mouth which have no resolved  - ulcer in the mouth have resolved   Past Medical History Reviewed problem list.  Medications- reviewed and updated No additions to family history Social history- patient is a non- smoker  Objective: BP 140/80 (BP Location: Left Arm, Patient Position: Sitting, Cuff Size: Normal)   Pulse 74   Temp 98.2 F (36.8 C) (Oral)   Ht 5\' 8"  (1.727 m)   Wt 159 lb 12.8 oz (72.5 kg)   SpO2 97%   BMI 24.30 kg/m  Gen: NAD, alert, cooperative with exam HEENT: Normal    Neck: No masses palpated. No lymphadenopathy    Throat: No abnormalities noted, no teeth, no ulcer or lesions noted on gums   Assessment/Plan: See problem based a/p  Metallic taste Metallic taste possibly patient had a herpes outbreak which contributed to patient's symptoms. Discussed with Dr. Nori Riis, will rule out other organic causes - Phenytoin level, free - Basic metabolic panel - magic mouthwash SOLN; Take 5 mLs by mouth 3 (three) times daily as needed for mouth pain.  Dispense: 15 mL; Refill: 0 - Follow up in about two weeks

## 2016-10-04 NOTE — Progress Notes (Signed)
Called patient and informed him that he did not get his labs done.  He said that he thought he was free to go after being given the AVS.  He agreed to come back in in 15 or 20 minutes.Moshe Cipro, Gaye Alken

## 2016-10-04 NOTE — Patient Instructions (Signed)
I want you to use Magic mouthwash to help with the metallic taste in your mouth. This might have been from oral herpes which sometimes can get into your nerve endings in cause changes in oral mucosa. I am also going to check some labs. This will likely resolve in the next couple of weeks

## 2016-10-07 NOTE — Assessment & Plan Note (Signed)
Metallic taste possibly patient had a herpes outbreak which contributed to patient's symptoms. Discussed with Dr. Nori Riis, will rule out other organic causes - Phenytoin level, free - Basic metabolic panel - magic mouthwash SOLN; Take 5 mLs by mouth 3 (three) times daily as needed for mouth pain.  Dispense: 15 mL; Refill: 0 - Follow up in about two weeks

## 2016-10-10 LAB — BASIC METABOLIC PANEL
BUN / CREAT RATIO: 8 — AB (ref 10–24)
BUN: 7 mg/dL — ABNORMAL LOW (ref 8–27)
CALCIUM: 9.7 mg/dL (ref 8.6–10.2)
CO2: 20 mmol/L (ref 20–29)
Chloride: 103 mmol/L (ref 96–106)
Creatinine, Ser: 0.9 mg/dL (ref 0.76–1.27)
GFR, EST AFRICAN AMERICAN: 95 mL/min/{1.73_m2} (ref >59)
GFR, EST NON AFRICAN AMERICAN: 82 mL/min/{1.73_m2} (ref >59)
Glucose: 121 mg/dL — ABNORMAL HIGH (ref 65–99)
Potassium: 4.3 mmol/L (ref 3.5–5.2)
Sodium: 138 mmol/L (ref 134–144)

## 2016-10-10 LAB — PHENYTOIN LEVEL, FREE: PHENYTOIN FREE: 0.8 ug/mL — AB (ref 1.0–2.0)

## 2016-10-11 ENCOUNTER — Encounter: Payer: Self-pay | Admitting: Internal Medicine

## 2016-10-11 ENCOUNTER — Telehealth: Payer: Self-pay | Admitting: Internal Medicine

## 2016-10-11 DIAGNOSIS — G40909 Epilepsy, unspecified, not intractable, without status epilepticus: Secondary | ICD-10-CM

## 2016-10-11 NOTE — Telephone Encounter (Signed)
Please let patient know his lab results were normal.  However his phenytoin was slightly low, therefore he should follow up with his neurologist in the next week or so to determine if he needs to have is medication titrated up a little bit.   Thanks Destini Cambre

## 2016-10-16 NOTE — Addendum Note (Signed)
Addended by: Kerrin Mo Z on: 10/16/2016 10:43 AM   Modules accepted: Orders

## 2016-10-16 NOTE — Telephone Encounter (Signed)
Placed referral to neurology. Hopefully patient will here from them soon. Thanks Dolorez Jeffrey

## 2016-10-16 NOTE — Telephone Encounter (Signed)
Spoke with patient and informed him of message from MD. Patient expressed understanding but states he no longer has a neurologist and need a referral to a new one. Patient states [facility] told him not to come back, needs referral to a new neurologist.

## 2016-10-17 ENCOUNTER — Encounter: Payer: Self-pay | Admitting: Neurology

## 2016-11-14 ENCOUNTER — Other Ambulatory Visit: Payer: Self-pay | Admitting: Internal Medicine

## 2016-11-14 DIAGNOSIS — I10 Essential (primary) hypertension: Secondary | ICD-10-CM

## 2016-11-15 ENCOUNTER — Other Ambulatory Visit: Payer: Self-pay | Admitting: Internal Medicine

## 2016-11-20 IMAGING — NM NM HEPATOBILIARY IMAGE, INC GB
2 series · 12 of 12 positions shown · non-contrast
Comparison: Ultrasound from 10/07/2015

CLINICAL DATA: Evaluate for acute cholecystitis.

EXAM:
NUCLEAR MEDICINE HEPATOBILIARY IMAGING
TECHNIQUE: Sequential images of the abdomen were obtained [DATE] minutes
following intravenous administration of radiopharmaceutical.
RADIOPHARMACEUTICALS:  6.5 mCi Uc-PPm  Choletec IV

[he hepatobiliary · 3.43mm/px · 6 of 19 frames shown (1 of 2)]
[frame 2/19]
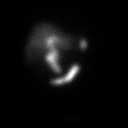
[frame 5/19]
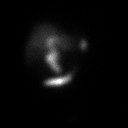
[frame 8/19]
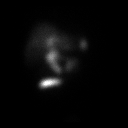
[frame 11/19]
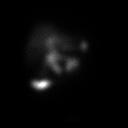
[frame 14/19]
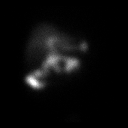
[frame 18/19]
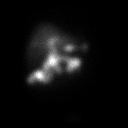

[he hepatobiliary · 3.43mm/px · 6 of 45 frames shown (2 of 2)]
[frame 4/45]
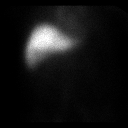
[frame 12/45]
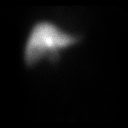
[frame 19/45]
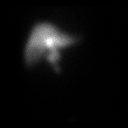
[frame 27/45]
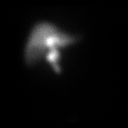
[frame 34/45]
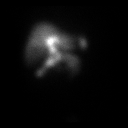
[frame 42/45]
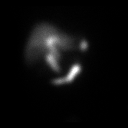

[12 of 12 positions shown; findings below may reference images not displayed]

FINDINGS: Prompt uptake and biliary excretion of activity by the liver is
seen. After 65 minutes no gallbladder activity is visualized. The
patient received 2.8 mg of morphine IV imaging was carried out for
an additional 30 minutes. On the delayed images there is prompt
filling of the gallbladder. Biliary activity passes into small
bowel, consistent with patent common bile duct.
IMPRESSION: 1. Patent cystic duct without evidence for acute cholecystitis.
2. There is delayed filling of the gallbladder which occurred
following the administration of morphine which may be associated
with chronic cholecystitis.

## 2016-12-11 ENCOUNTER — Ambulatory Visit (INDEPENDENT_AMBULATORY_CARE_PROVIDER_SITE_OTHER): Payer: Medicare Other | Admitting: Neurology

## 2016-12-11 ENCOUNTER — Other Ambulatory Visit: Payer: Medicare Other

## 2016-12-11 ENCOUNTER — Encounter: Payer: Self-pay | Admitting: Neurology

## 2016-12-11 VITALS — BP 182/78 | HR 91 | Ht 66.0 in | Wt 156.0 lb

## 2016-12-11 DIAGNOSIS — R251 Tremor, unspecified: Secondary | ICD-10-CM | POA: Diagnosis not present

## 2016-12-11 DIAGNOSIS — Z79899 Other long term (current) drug therapy: Secondary | ICD-10-CM | POA: Diagnosis not present

## 2016-12-11 DIAGNOSIS — G40009 Localization-related (focal) (partial) idiopathic epilepsy and epileptic syndromes with seizures of localized onset, not intractable, without status epilepticus: Secondary | ICD-10-CM

## 2016-12-11 DIAGNOSIS — G479 Sleep disorder, unspecified: Secondary | ICD-10-CM | POA: Diagnosis not present

## 2016-12-11 DIAGNOSIS — G40909 Epilepsy, unspecified, not intractable, without status epilepticus: Secondary | ICD-10-CM | POA: Diagnosis not present

## 2016-12-11 NOTE — Patient Instructions (Addendum)
1. Check vitamin D level  Your provider has requested that you have labwork completed today. Please go to [facility] ([id number]) on the second floor of this building before leaving the office today. You do not need to check in. If you are not called within 15 minutes please check with the front desk.   2. Schedule Bone density scan  We have sent a referral to [facility] for your bone density scan.  You will get a call from them to schedule this scan.  If you need to contact them for any reason, their phone number is [phone]  3. Continue Dilantin 300mg  every night 4. Follow-up in 1 year, call for any changes  Seizure Precautions: 1. If medication has been prescribed for you to prevent seizures, take it exactly as directed.  Do not stop taking the medicine without talking to your doctor first, even if you have not had a seizure in a long time.   2. Avoid activities in which a seizure would cause danger to yourself or to others.  Don't operate dangerous machinery, swim alone, or climb in high or dangerous places, such as on ladders, roofs, or girders.  Do not drive unless your doctor says you may.  3. If you have any warning that you may have a seizure, lay down in a safe place where you can't hurt yourself.    4.  No driving for 6 months from last seizure, as per [facility].   Please refer to the following link on the [facility] website for more information: http://www.epilepsyfoundation.org/answerplace/Social/driving/drivingu.cfm   5.  Maintain good sleep hygiene. Avoid alcohol.  6.  Contact your doctor if you have any problems that may be related to the medicine you are taking.  7.  Call 911 and bring the patient back to the ED if:        A.  The seizure lasts longer than 5 minutes.       B.  The patient doesn't awaken shortly after the seizure  C.  The patient has new problems such as difficulty seeing, speaking or moving  D.   The patient was injured during the seizure  E.  The patient has a temperature over 102 F (39C)  F.  The patient vomited and now is having trouble breathing

## 2016-12-11 NOTE — Progress Notes (Signed)
NEUROLOGY CONSULTATION NOTE  Phillip Hill MRN: [id number] DOB: 24-Sep-1940  Referring provider: Dr. Kerrin Mo Primary care provider: Dr. [name]  Reason for consult:  Low Dilantin level  Dear Dr Emmaline Life:  Thank you for your kind referral of Phillip Hill for consultation of the above symptoms. Although his history is well known to you, please allow me to reiterate it for the purpose of our medical record. Records and images were personally reviewed where available.  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 Records from his previous neurologist Dr. Krista Blue were reviewed. He started having seizures in the 1970s. It appears he was seen by Dr. Johnnye Sima in 1975 and Drs. Steiffel and Adleman in 1979. He has been on Dilantin 300mg  qhs since then. He reports that his seizures have all been in his sleep, however notes indicate that he had episodes of confusion, he would walk then fall to the ground with no warning. EEG in November 1988 reported dysrhythmia in the left temporal region, there were focal sharp waves and sharp and slow wave discharges seen in the left frontotemporal region. He had a normal head CT, LP, and also another normal EEG. There is noted of documented osteoporosis, on Actonel that was discontinued in 2007 due to GI upset. His last known seizure was in 1995. He had last seen Dr. Krista Blue in 2015, at that time total Dilantin level was also low at 7.0. He saw his PCP recently and had a free Dilantin level done, 0.8. He denies any side effects on the medication, previous discussions to switch to a different AED in the past were done, he had declined since he has been doing well.  He lives with his brother and denies any staring/unresponsive episodes. He denies any gaps in time, deja vu, rising epigastric sensation, focal  numbness/tingling/weakness, myoclonic jerks. He had reported a metallic taste in his mouth that he reports resolved after using magic mouthwash. He has rare brief episodes of possible olfactory hallucinations where he has difficulty describing the smell, but denies any associated confusion or other symptoms with them. He denies waking up with tongue bite or incontinence recently. His appetite has not been good, he sometimes goes all day without eating, no weight loss. He has occasional dizziness even when sitting. No significant headaches. He has had double vision occasionally for many years, he has a lazy eye on the left. No dysphagia. He has chronic back pain, no constipation/incontinence.   Epilepsy Risk Factors:  He had a normal birth and early development.  There is no history of febrile convulsions, CNS infections such as meningitis/encephalitis, significant traumatic brain injury, neurosurgical procedures, or family history of seizures.   PAST MEDICAL HISTORY: Past Medical History:  Diagnosis Date  . Arthritis   . Chronic cystitis   . Complex partial seizure disorder ([place])    began 1970's--  last seizure 1995  per neurologist note 10/ 2015--  dr Krista Blue  . Diverticulosis of colon   . Frequency of urination   . Hemorrhoids   . History of herpes simplex type 2 infection   . History of lower GI bleeding    secondary to ASA use--  resolved  . Hypertension   . Lazy eye of left side   . Nocturia   . PAC (premature atrial contraction)   . Seizures (  [place])   . Urgency of urination     PAST SURGICAL HISTORY: Past Surgical History:  Procedure Laterality Date  . ARM AMPUTATION Right 1960   infection due to injury  . CHOLECYSTECTOMY N/A 10/22/2015   Procedure: LAPAROSCOPIC CHOLECYSTECTOMY WITH INTRAOPERATIVE CHOLANGIOGRAM;  Surgeon: Stark Klein, MD;  Location: [facility];  Service: General;  Laterality: N/A;  . COLONOSCOPY  10/24/2011   Procedure: COLONOSCOPY;  Surgeon: Milus Banister, MD;   Location: [facility];  Service: Endoscopy;  Laterality: N/A;  . CYSTOSCOPY WITH BIOPSY N/A 06/28/2014   Procedure: CYSTOSCOPY WITH BIOPSY;  Surgeon: Lowella Bandy, MD;  Location: [facility];  Service: Urology;  Laterality: N/A;  . INGUINAL HERNIA REPAIR Left 09-15-2009  . TRANSTHORACIC ECHOCARDIOGRAM  10-28-2013   dr Tressia Miners turner   mild focal basal hypertrophy of septum/  ef 19-50%/  grade I diastolic dysfunction/  mild AR/  trivial MR and TR    MEDICATIONS: Current Outpatient Prescriptions on File Prior to Visit  Medication Sig Dispense Refill  . hydrALAZINE (APRESOLINE) 25 MG tablet TAKE 1 TABLET BY MOUTH TWICE A DAY 180 tablet 2  . magic mouthwash SOLN Take 5 mLs by mouth 3 (three) times daily as needed for mouth pain. 15 mL 0  . metoprolol tartrate (LOPRESSOR) 50 MG tablet TAKE 1 TABLET (50 MG TOTAL) BY MOUTH EVERY MORNING. 90 tablet 0  . phenytoin (DILANTIN) 100 MG ER capsule TAKE 3 CAPSULES (300 MG TOTAL) BY MOUTH AT BEDTIME. 270 capsule 1  . valACYclovir (VALTREX) 500 MG tablet TAKE 1 TABLET (500 MG TOTAL) BY MOUTH DAILY. 90 tablet 1   No current facility-administered medications on file prior to visit.     ALLERGIES: Allergies  Allergen Reactions  . Ciprofloxacin Other (See Comments)    "shaking, like cold chills"  . Penicillins Hives    FAMILY HISTORY: Family History  Problem Relation Age of Onset  . Cancer Mother   . Cancer Sister   . Cancer Brother     SOCIAL HISTORY: Social History   Social History  . Marital status: Divorced    Spouse name: N/A  . Number of children: 2  . Years of education: 9 th   Occupational History  .      Retired/Disabled   Social History Main Topics  . Smoking status: Former Smoker    Types: Cigarettes    Quit date: 10/23/1968  . Smokeless tobacco: Never Used  . Alcohol use No     Comment: Quit alcohol in 1970  . Drug use: No  . Sexual activity: Not on file   Other Topics Concern  . Not on file   Social  History Narrative   Patient lives at home alone and his brother stayes with him sometimes.   Retired / Disabled.   Education 9 th grade   Caffeine Six cups of coffee daily.   Left handed.    REVIEW OF SYSTEMS: Constitutional: No fevers, chills, or sweats, no generalized fatigue, change in appetite Eyes: No visual changes, double vision, eye pain Ear, nose and throat: No hearing loss, ear pain, nasal congestion, sore throat Cardiovascular: No chest pain, palpitations Respiratory:  No shortness of breath at rest or with exertion, wheezes GastrointestinaI: No nausea, vomiting, diarrhea, abdominal pain, fecal incontinence Genitourinary:  No dysuria, urinary retention or frequency Musculoskeletal:  No neck pain, back pain Integumentary: No rash, pruritus, skin lesions Neurological: as above Psychiatric: No depression, insomnia, anxiety Endocrine: No palpitations, fatigue, diaphoresis, mood swings, change in  appetite, change in weight, increased thirst Hematologic/Lymphatic:  No anemia, purpura, petechiae. Allergic/Immunologic: no itchy/runny eyes, nasal congestion, recent allergic reactions, rashes  PHYSICAL EXAM: Vitals:   12/11/16 0945  BP: (!) 182/78  Pulse: 91  SpO2: 94%   General: No acute distress Head:  Normocephalic/atraumatic, edentulous Eyes: Fundoscopic exam shows bilateral sharp discs, no vessel changes, exudates, or hemorrhages Neck: supple, no paraspinal tenderness, full range of motion Back: No paraspinal tenderness Heart: regular rate and rhythm Lungs: Clear to auscultation bilaterally. Vascular: No carotid bruits. Skin/Extremities: No rash, no edema. Prosthetic right arm Neurological Exam: Mental status: alert and oriented to person, place, and time, no dysarthria or aphasia, Fund of knowledge is appropriate.  Recent and remote memory are intact.  Attention and concentration are normal.    Able to name objects and repeat phrases. Cranial nerves: CN I: not  tested CN II: pupils equal, round and reactive to light, visual fields intact, fundi unremarkable. CN III, IV, VI:  Left esotropia, full range of motion, no nystagmus, no ptosis CN V: facial sensation intact CN VII: upper and lower face symmetric CN VIII: hearing intact to finger rub CN IX, X: gag intact, uvula midline CN XI: sternocleidomastoid and trapezius muscles intact CN XII: tongue midline Bulk & Tone: normal, no fasciculations. Motor: 5/5 throughout with no pronator drift. Sensation: intact to light touch, cold, pin, vibration and joint position sense.  No extinction to double simultaneous stimulation.  Romberg test negative Deep Tendon Reflexes: +2 throughout except for absent ankle jerks bilaterally, no ankle clonus Plantar responses: downgoing bilaterally Cerebellar: no incoordination on finger to nose, heel to shin. No dysdiadochokinesia Gait: limps with left leg Tremor: none  IMPRESSION: This is a pleasant 76 year old now left-handed man (s/p right arm amputation) with a history of well-controlled left temporal lobe epilepsy, seizure-free for more than 20 years on Dilantin 300mg  qhs, presenting for evaluation due to low Dilantin level. Historically his Dilantin level has been low (7.0 in 2015), we discussed how levels can help guide clinicians, but that every patient is different, and we would follow him clinically rather than by drug levels, especially since he has been seizure-free. Continue on current dose of Dilantin 300mg  qhs. Certainly if symptoms change, we will consider medication adjustment. We discussed effects of Dilantin on bone health, check vitamin D level and bone density scan. [place] driving laws were discussed with the patient, and he knows to stop driving after a seizure, until 6 months seizure-free. He will follow-up in 1 year and knows to call for any changes.   Thank you for allowing me to participate in the care of this patient. Please do not hesitate to call for  any questions or concerns.   Ellouise Newer, M.D.  CC: Dr. Brett Albino, Dr. Emmaline Life

## 2016-12-12 ENCOUNTER — Telehealth: Payer: Self-pay

## 2016-12-12 LAB — VITAMIN D 25 HYDROXY (VIT D DEFICIENCY, FRACTURES): Vit D, 25-Hydroxy: 16 ng/mL — ABNORMAL LOW (ref 30–100)

## 2016-12-12 MED ORDER — VITAMIN D (ERGOCALCIFEROL) 1.25 MG (50000 UNIT) PO CAPS: 50000.0000 [IU] | ORAL_CAPSULE | ORAL | 0 refills | Status: DC

## 2016-12-12 NOTE — Telephone Encounter (Signed)
-----   Message from Alda Berthold, DO sent at 12/12/2016  9:29 AM EDT ----- Please inform patient that his vitamin D is low and he needs to start vitamin D 50,000 units weekly x 8 weeks (#8 tab, 0 refills) and then OTC vitamin D 1000 units daily.  Please send Rx for vitamin D 50,000 units. Thanks.

## 2016-12-12 NOTE — Telephone Encounter (Signed)
Spoke with pt relaying message below.  Rx sent to [facility] on [address].

## 2016-12-23 ENCOUNTER — Ambulatory Visit (INDEPENDENT_AMBULATORY_CARE_PROVIDER_SITE_OTHER): Payer: Medicare Other | Admitting: Internal Medicine

## 2016-12-23 ENCOUNTER — Encounter: Payer: Self-pay | Admitting: Internal Medicine

## 2016-12-23 DIAGNOSIS — B009 Herpesviral infection, unspecified: Secondary | ICD-10-CM | POA: Diagnosis not present

## 2016-12-23 DIAGNOSIS — I10 Essential (primary) hypertension: Secondary | ICD-10-CM

## 2016-12-23 MED ORDER — VALACYCLOVIR HCL 500 MG PO TABS: ORAL_TABLET | ORAL | 1 refills | Status: DC

## 2016-12-23 NOTE — Progress Notes (Signed)
   [facility] Phone: [phone]  Subjective:  Phillip Hill is a 76 year old male presenting to clinic for follow-up of his HTN and HSV-2.  HTN: Previously taking Metoprolol and Hydralazine. Stopped taking Metoprolol 2 months ago because he noticed that his left leg and foot were having some numbness. He read the side effects for the Metoprolol and he saw that leg numbness was listed. The numbness resolved after stopped the medication. He is still taking the Hydralazine as prescribed. He has been checking his blood pressures at home once in a while since stopping the Metoprolol. They have all been in the 703J systolic. He states he has only had one bad BP in the last 6 months and that happened at his Neurologist's office. He had not taken his Hydralazine that day. No chest pain, no SOB, no lower extremity edema.  HSV-2: Has not had any outbreaks for > 10 years. States he only had one outbreak. He has not noticed any lesions recently. Tolerating the Valtrex without any issues.  ROS: See HPI for pertinent positives and negatives  Past Medical History- HTN, seizure disorder, HLD, HSV-2, arthritis, s/p right upper limb amputation  Family history reviewed for today's visit. No changes.  Social history- patient is a former smoker. Quit in 1970.  Objective: BP 122/80 (BP Location: Left Arm, Patient Position: Sitting, Cuff Size: Normal)   Pulse (!) 112   Temp 98.2 F (36.8 C) (Oral)   Ht 5\' 8"  (1.727 m)   Wt 157 lb 12.8 oz (71.6 kg)   SpO2 98%   BMI 23.99 kg/m  Gen: NAD, alert, cooperative with exam HEENT: NCAT, EOMI, MMM Neck: FROM, supple CV: RRR, no murmur Resp: CTABL, no wheezes, normal work of breathing Msk: No edema, warm, normal tone, moves UE/LE spontaneously, right arm prosthetic in place. Neuro: Alert and oriented, no gross deficits  Assessment/Plan: HTN: Well-controlled. BP 122/80 in clinic today. He stopped the Metoprolol 2 months ago. - Can continue to  hold Metoprolol. - Continue Hydralazine 25mg  bid - Follow-up in 6 months  HSV-2: Well-controlled with Valtrex. - Valtrex refilled   [name], MD PGY-3

## 2016-12-23 NOTE — Patient Instructions (Signed)
It was wonderful to see you today!  Everything looks great! We will see you back in 6 months!  -Dr. Brett Albino

## 2016-12-25 NOTE — Assessment & Plan Note (Signed)
Well-controlled with Valtrex. - Valtrex refilled

## 2016-12-25 NOTE — Assessment & Plan Note (Signed)
Well-controlled. BP 122/80 in clinic today. He stopped the Metoprolol 2 months ago. - Can continue to hold Metoprolol. - Continue Hydralazine 25mg  bid - Follow-up in 6 months

## 2016-12-30 ENCOUNTER — Other Ambulatory Visit: Payer: Self-pay | Admitting: Internal Medicine

## 2016-12-30 ENCOUNTER — Ambulatory Visit
Admission: RE | Admit: 2016-12-30 | Discharge: 2016-12-30 | Disposition: A | Discharge status: Home / Self Care | Payer: Medicare Other | Source: Ambulatory Visit | Attending: Neurology | Admitting: Neurology

## 2016-12-30 ENCOUNTER — Telehealth: Payer: Self-pay

## 2016-12-30 DIAGNOSIS — Z13820 Encounter for screening for osteoporosis: Secondary | ICD-10-CM | POA: Diagnosis not present

## 2016-12-30 DIAGNOSIS — Z79899 Other long term (current) drug therapy: Secondary | ICD-10-CM

## 2016-12-30 DIAGNOSIS — G40009 Localization-related (focal) (partial) idiopathic epilepsy and epileptic syndromes with seizures of localized onset, not intractable, without status epilepticus: Secondary | ICD-10-CM

## 2016-12-30 NOTE — Telephone Encounter (Signed)
-----   Message from Cameron Sprang, MD sent at 12/30/2016 10:38 AM EDT ----- Pls let him know bone density scan was normal, thanks

## 2016-12-30 NOTE — Telephone Encounter (Signed)
Spoke with pt relaying message below.   

## 2017-02-11 ENCOUNTER — Other Ambulatory Visit: Payer: Self-pay | Admitting: Internal Medicine

## 2017-02-11 MED ORDER — PHENYTOIN SODIUM EXTENDED 100 MG PO CAPS: 300.0000 mg | ORAL_CAPSULE | Freq: Every day | ORAL | 1 refills | Status: DC

## 2017-06-02 ENCOUNTER — Ambulatory Visit (INDEPENDENT_AMBULATORY_CARE_PROVIDER_SITE_OTHER): Payer: Medicare Other | Admitting: Internal Medicine

## 2017-06-02 DIAGNOSIS — I10 Essential (primary) hypertension: Secondary | ICD-10-CM

## 2017-06-02 MED ORDER — METOPROLOL SUCCINATE ER 25 MG PO TB24: 25.0000 mg | ORAL_TABLET | Freq: Every day | ORAL | 0 refills | Status: DC

## 2017-06-02 NOTE — Assessment & Plan Note (Signed)
Well-controlled. BP 121/80 today. HR elevated likely in the setting of stopping beta blocker recently. - Continue Hydralazine 25mg  bid - Change Metoprolol tartrate 50mg  daily to Metoprolol succinate 25mg  daily, as metoprolol tartrate was causing his legs to hurt - Continue checking blood pressures at home - Follow-up in 6 months

## 2017-06-02 NOTE — Patient Instructions (Addendum)
It was so nice to see you today!  I have changed your metoprolol tartrate (lopressor) to metoprolol succinate (toprol-XL) at a lower dose (25mg  daily). Please take 1 tablet every day. Let me know if you have any issues with this.  I have also sent in a new referral to urology for you.  We will see you back in 6 months or earlier if needed!  -Dr. Brett Albino

## 2017-06-02 NOTE — Progress Notes (Signed)
   [facility] Phone: [phone]  Subjective:  Phillip Hill is a 77 year old male presenting to clinic for HTN follow-up. He checks his blood pressure at home occasionally and they run 322-025 systolics. No lows or highs. Previously taking Metoprolol tartrate 50mg  once daily, but stopped this because it was causing his legs to hurt. When he stopped the Metoprolol, the pain went away. Also taking Hydralazine bid. No side effects. No chest pain, no dizziness, no palpitations.  ROS: See HPI for pertinent positives and negatives  Past Medical History- HTN, seizure disorder, HLD, HSV-2  Family history reviewed for today's visit. No changes.  Social history- patient is a former smoker  Objective: BP 121/80 (BP Location: Left Wrist, Patient Position: Sitting, Cuff Size: Normal)   Pulse (!) 115   Temp 99.5 F (37.5 C) (Oral)   Wt 159 lb 9.6 oz (72.4 kg)   SpO2 97%   BMI 24.27 kg/m  Gen: NAD, alert, cooperative with exam HEENT: NCAT, EOMI, MMM Neck: FROM, supple CV: Tachycardic, regular rhythm, no murmur Resp: CTABL, no wheezes, normal work of breathing Msk: Right upper extremity amputation with prosthetic in place  Assessment/Plan: HTN: Well-controlled. BP 121/80 today. HR elevated likely in the setting of stopping beta blocker recently. - Continue Hydralazine 25mg  bid - Change Metoprolol tartrate 50mg  daily to Metoprolol succinate 25mg  daily, as metoprolol tartrate was causing his legs to hurt - Continue checking blood pressures at home - Follow-up in 6 months   [name], MD PGY-3

## 2017-06-21 ENCOUNTER — Other Ambulatory Visit: Payer: Self-pay | Admitting: Internal Medicine

## 2017-07-21 ENCOUNTER — Encounter (HOSPITAL_COMMUNITY): Payer: Self-pay | Admitting: Internal Medicine

## 2017-07-21 ENCOUNTER — Emergency Department (HOSPITAL_COMMUNITY): Payer: Medicare Other

## 2017-07-21 ENCOUNTER — Emergency Department (HOSPITAL_COMMUNITY)
Admission: EM | Admit: 2017-07-21 | Discharge: 2017-07-21 | Disposition: A | Discharge status: Home / Self Care | Payer: Medicare Other | Attending: Emergency Medicine | Admitting: Emergency Medicine

## 2017-07-21 DIAGNOSIS — K649 Unspecified hemorrhoids: Secondary | ICD-10-CM | POA: Diagnosis not present

## 2017-07-21 DIAGNOSIS — R1084 Generalized abdominal pain: Secondary | ICD-10-CM | POA: Diagnosis not present

## 2017-07-21 DIAGNOSIS — K921 Melena: Secondary | ICD-10-CM | POA: Diagnosis not present

## 2017-07-21 DIAGNOSIS — R000 Tachycardia, unspecified: Secondary | ICD-10-CM | POA: Diagnosis not present

## 2017-07-21 DIAGNOSIS — Z79899 Other long term (current) drug therapy: Secondary | ICD-10-CM | POA: Diagnosis not present

## 2017-07-21 DIAGNOSIS — I10 Essential (primary) hypertension: Secondary | ICD-10-CM | POA: Diagnosis not present

## 2017-07-21 DIAGNOSIS — Z87891 Personal history of nicotine dependence: Secondary | ICD-10-CM | POA: Diagnosis not present

## 2017-07-21 DIAGNOSIS — K644 Residual hemorrhoidal skin tags: Secondary | ICD-10-CM | POA: Diagnosis not present

## 2017-07-21 DIAGNOSIS — K625 Hemorrhage of anus and rectum: Secondary | ICD-10-CM

## 2017-07-21 LAB — CBC
HCT: 37.2 % — ABNORMAL LOW (ref 39.0–52.0)
Hemoglobin: 13.1 g/dL (ref 13.0–17.0)
MCH: 33.4 pg (ref 26.0–34.0)
MCHC: 35.2 g/dL (ref 30.0–36.0)
MCV: 94.9 fL (ref 78.0–100.0)
PLATELETS: 233 10*3/uL (ref 150–400)
RBC: 3.92 MIL/uL — ABNORMAL LOW (ref 4.22–5.81)
RDW: 13.3 % (ref 11.5–15.5)
WBC: 6.4 10*3/uL (ref 4.0–10.5)

## 2017-07-21 LAB — TYPE AND SCREEN
ABO/RH(D): A POS
Antibody Screen: NEGATIVE

## 2017-07-21 LAB — COMPREHENSIVE METABOLIC PANEL
ALK PHOS: 101 U/L (ref 38–126)
ALT: 21 U/L (ref 17–63)
ANION GAP: 9 (ref 5–15)
AST: 22 U/L (ref 15–41)
Albumin: 3.7 g/dL (ref 3.5–5.0)
BILIRUBIN TOTAL: 0.3 mg/dL (ref 0.3–1.2)
BUN: 12 mg/dL (ref 6–20)
CALCIUM: 9.2 mg/dL (ref 8.9–10.3)
CO2: 23 mmol/L (ref 22–32)
CREATININE: 0.99 mg/dL (ref 0.61–1.24)
Chloride: 107 mmol/L (ref 101–111)
GFR calc Af Amer: >60 mL/min (ref >60)
GFR calc non Af Amer: >60 mL/min (ref >60)
GLUCOSE: 134 mg/dL — AB (ref 65–99)
Potassium: 4 mmol/L (ref 3.5–5.1)
SODIUM: 139 mmol/L (ref 135–145)
TOTAL PROTEIN: 7.5 g/dL (ref 6.5–8.1)

## 2017-07-21 LAB — PROTIME-INR
INR: 0.98
PROTHROMBIN TIME: 12.9 s (ref 11.4–15.2)

## 2017-07-21 LAB — POC OCCULT BLOOD, ED: Fecal Occult Bld: POSITIVE — AB

## 2017-07-21 MED ORDER — IOPAMIDOL (ISOVUE-300) INJECTION 61%
INTRAVENOUS | Status: AC
  Filled 2017-07-21: qty 100

## 2017-07-21 MED ORDER — IOPAMIDOL (ISOVUE-300) INJECTION 61%
100.0000 mL | Freq: Once | INTRAVENOUS | Status: AC | PRN
  Administered 2017-07-21: 100 mL via INTRAVENOUS

## 2017-07-21 MED ORDER — HYDROCORTISONE ACETATE 25 MG RE SUPP: 25.0000 mg | Freq: Two times a day (BID) | RECTAL | 0 refills | Status: DC

## 2017-07-21 NOTE — Discharge Instructions (Signed)
1 

## 2017-07-21 NOTE — ED Triage Notes (Addendum)
Pt arrived via [facility] c/o has dark red blood in stool x2 days. Pt states he has a history of "pockets of blood in colon." Denies N/V/D. Pt denies abdominal pain.

## 2017-07-21 NOTE — ED Provider Notes (Signed)
[facility] DEPT Provider Note   CSN: [id number] Arrival date & time: 07/21/17  3016     History   Chief Complaint Chief Complaint  Patient presents with  . Rectal Bleeding    HPI Phillip Hill is a 77 y.o. male.  HPI Reports 3 days ago he saw dark, bloody looking stool.  It happened 2 times.  Reports no episodes yesterday.  He reports this morning again with bowel movement he saw dark blood.  He denies is been having abdominal pain but does note his abdomen sounds like his been gurgling a lot over the past 2 days.  He reports he has had loss of appetite over several months.  He denies he is lost weight.  No vomiting.  No pain with eating.  He reports he does make himself eat.  He denies shortness of breath or significant weakness.  He reports sometimes he feels a little out of energy but attributes it to his age.  Patient reports for several weeks he has been taking Aleve intermittently.  He denies taking it on a daily basis.  He takes it sometimes for hip pain.  He reports he has been told that he should have a hip replacement on the left but reports he does not intend to have that done due to his already advanced age and because "he intends to go out with the same things he came in with".  Patient has stoic outlook and states that, back before all of these medical things were available, people had to deal with some pain as they were aging and he anticipates that everything will not be perfect. Past Medical History:  Diagnosis Date  . Arthritis   . Chronic cystitis   . Complex partial seizure disorder ([place])    began 1970's--  last seizure 1995  per neurologist note 10/ 2015--  dr Krista Blue  . Diverticulosis of colon   . Frequency of urination   . Hemorrhoids   . History of herpes simplex type 2 infection   . History of lower GI bleeding    secondary to ASA use--  resolved  . Hypertension   . Lazy eye of left side   . Nocturia   . PAC (premature atrial  contraction)   . Seizures ([place])   . Urgency of urination     Patient Active Problem List   Diagnosis Date Noted  . 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 Health care maintenance 09/07/2015  . HSV-2 (herpes simplex virus 2) infection 06/09/2015  . Arthritis   . Sleep disorder   . Hypertension   . Upper limb amputation, shoulder ([place])   . Essential and other specified forms of tremor   . Diverticula of colon 10/24/2011  . Seizure disorder ([place]) 10/21/2011  . Dyslipidemia 10/21/2011    Past Surgical History:  Procedure Laterality Date  . ARM AMPUTATION Right 1960   infection due to injury  . CHOLECYSTECTOMY N/A 10/22/2015   Procedure: LAPAROSCOPIC CHOLECYSTECTOMY WITH INTRAOPERATIVE CHOLANGIOGRAM;  Surgeon: Stark Klein, MD;  Location: [facility];  Service: General;  Laterality: N/A;  . COLONOSCOPY  10/24/2011   Procedure: COLONOSCOPY;  Surgeon: Milus Banister, MD;  Location: [facility];  Service: Endoscopy;  Laterality: N/A;  . CYSTOSCOPY WITH BIOPSY N/A 06/28/2014   Procedure: CYSTOSCOPY WITH BIOPSY;  Surgeon: Lowella Bandy, MD;  Location: [facility];  Service: Urology;  Laterality: N/A;  .  INGUINAL HERNIA REPAIR Left 09-15-2009  . TRANSTHORACIC ECHOCARDIOGRAM  10-28-2013   dr Tressia Miners turner   mild focal basal hypertrophy of septum/  ef 67-89%/  grade I diastolic dysfunction/  mild AR/  trivial MR and TR        Home Medications    Prior to Admission medications   Medication Sig Start Date End Date Taking? Authorizing Provider  hydrALAZINE (APRESOLINE) 25 MG tablet TAKE 1 TABLET BY MOUTH TWICE A DAY 11/14/16   Mayo, Pete Pelt, MD  hydrocortisone (ANUSOL-HC) 25 MG suppository Place 1 suppository (25 mg total) rectally 2 (two) times daily. For 7 days 07/21/17   Charlesetta Shanks, MD  magic mouthwash SOLN Take 5 mLs by mouth 3 (three) times daily as needed  for mouth pain. 10/04/16   Mikell, Jeani Sow, MD  metoprolol succinate (TOPROL-XL) 25 MG 24 hr tablet Take 1 tablet (25 mg total) by mouth daily. 06/02/17   Mayo, Pete Pelt, MD  phenytoin (DILANTIN) 100 MG ER capsule Take 3 capsules (300 mg total) by mouth at bedtime. 02/11/17   Mayo, Pete Pelt, MD  valACYclovir (VALTREX) 500 MG tablet TAKE 1 TABLET BY MOUTH EVERY DAY 06/23/17   Mayo, Pete Pelt, MD  Vitamin D, Ergocalciferol, (DRISDOL) 50000 units CAPS capsule Take 1 capsule (50,000 Units total) by mouth every 7 (seven) days. 12/12/16   Alda Berthold, DO    Family History Family History  Problem Relation Age of Onset  . Cancer Mother   . Cancer Sister   . Cancer Brother     Social History Social History   Tobacco Use  . Smoking status: Former Smoker    Types: Cigarettes    Last attempt to quit: 10/23/1968    Years since quitting: 48.7  . Smokeless tobacco: Never Used  Substance Use Topics  . Alcohol use: No    Comment: Quit alcohol in 1970  . Drug use: No     Allergies   Ciprofloxacin and Penicillins   Review of Systems Review of Systems 10 Systems reviewed and are negative for acute change except as noted in the HPI.   Physical Exam Updated Vital Signs BP (!) 142/83   Pulse (!) 53   Temp 98.3 F (36.8 C) (Oral)   Resp 14   Ht 5\' 6"  (1.676 m)   Wt 72.1 kg (159 lb)   SpO2 95%   BMI 25.66 kg/m   Physical Exam  Constitutional: He is oriented to person, place, and time. He appears well-developed and well-nourished. No distress.  HENT:  Head: Normocephalic and atraumatic.  Eyes: EOM are normal.  Cardiovascular: Normal rate, regular rhythm, normal heart sounds and intact distal pulses.  Pulmonary/Chest: Effort normal and breath sounds normal.  Abdominal: Soft. Bowel sounds are normal. He exhibits no distension and no mass. There is no tenderness. There is no guarding.  Genitourinary:  Genitourinary Comments: Rectal: Patient has large external hemorrhoid.  It is  not actively bleeding.  Digital exam shows prostate to be enlarged but no focal nodules.  Rectum feels slightly thickened.  There is cranberry colored blood in the vault.  No active bleeding.  Musculoskeletal: Normal range of motion. He exhibits no edema or tenderness.  No peripheral edema.  Calves are soft and nontender.  Condition of skin and feet is good.  Neurological: He is alert and oriented to person, place, and time. He exhibits normal muscle tone. Coordination normal.  Skin: Skin is warm and dry.  Psychiatric: He has a normal mood  and affect.     ED Treatments / Results  Labs (all labs ordered are listed, but only abnormal results are displayed) Labs Reviewed  COMPREHENSIVE METABOLIC PANEL - Abnormal; Notable for the following components:      Result Value   Glucose, Bld 134 (*)    All other components within normal limits  CBC - Abnormal; Notable for the following components:   RBC 3.92 (*)    HCT 37.2 (*)    All other components within normal limits  POC OCCULT BLOOD, ED - Abnormal; Notable for the following components:   Fecal Occult Bld POSITIVE (*)    All other components within normal limits  PROTIME-INR  TYPE AND SCREEN    EKG None  Radiology Ct Abdomen Pelvis W Contrast  Result Date: 07/21/2017 CLINICAL DATA:  Bloody stools since Saturday. EXAM: CT ABDOMEN AND PELVIS WITH CONTRAST TECHNIQUE: Multidetector CT imaging of the abdomen and pelvis was performed using the standard protocol following bolus administration of intravenous contrast. CONTRAST:  150mL ISOVUE-300 IOPAMIDOL (ISOVUE-300) INJECTION 61% COMPARISON:  CT abdomen pelvis dated October 07, 2015. FINDINGS: Lower chest: No acute abnormality. Hepatobiliary: Unchanged simple cyst in the right liver. No new focal liver abnormality. Status post cholecystectomy. No biliary dilatation. Pancreas: Unremarkable. No pancreatic ductal dilatation or surrounding inflammatory changes. Spleen: Normal in size without focal  abnormality. Adrenals/Urinary Tract: The adrenal glands are unremarkable. Stable right renal cysts. No renal or ureteral calculi. No hydronephrosis. The bladder is unremarkable. Stomach/Bowel: Stomach is within normal limits. Appendix appears normal. No evidence of bowel wall thickening, distention, or inflammatory changes. Moderate colonic diverticulosis. Vascular/Lymphatic: Aortic atherosclerosis. No enlarged abdominal or pelvic lymph nodes. Reproductive: Stable prostatomegaly. Other: No abdominal wall hernia or abnormality. No abdominopelvic ascites. No pneumoperitoneum. Musculoskeletal: No acute or significant osseous findings. Stable degenerative changes of the lumbar spine and left hip. IMPRESSION: 1.  No acute intra-abdominal process. 2.  Aortic atherosclerosis (ICD10-I70.0). Electronically Signed   By: Titus Dubin M.D.   On: 07/21/2017 10:20    Procedures Procedures (including critical care time)  Medications Ordered in ED Medications  iopamidol (ISOVUE-300) 61 % injection (has no administration in time range)  iopamidol (ISOVUE-300) 61 % injection 100 mL (100 mLs Intravenous Contrast Given 07/21/17 0946)     Initial Impression / Assessment and Plan / ED Course  I have reviewed the triage vital signs and the nursing notes.  Pertinent labs & imaging results that were available during my care of the patient were reviewed by me and considered in my medical decision making (see chart for details).      Final Clinical Impressions(s) / ED Diagnoses   Final diagnoses:  Rectal bleeding  Hemorrhoids, unspecified hemorrhoid type   Patient had bleeding with bowel movement day before yesterday and today.  Abdomen is soft and nontender.  Rectal exam does show a large, thrombosed but active appearing hemorrhoid.  CT abdomen does not show any anomaly, CBC is stable and vital signs are stable.  At this time I feel patient is stable for discharge with close follow-up.  Plan will be to initiate  treatment for hemorrhoid.  I have explained to the patient and including discharge instructions that he needs a repeat CBC within 2 days.  Return precautions are reviewed.  I have also explained the necessity of a scheduled colonoscopy.  Patient's daughter arrived after my assessment had been completed.  I had reviewed all the results with the patient and reviewed his plan.  At that time (before  his daughter arrived and when I gave results), patient advised that he needed to go to the restroom.  I entered a nursing communication note to have the patient go on a bedside commode to see what volume of bleeding might be present.  I later returned to the room, to find the patient's daughter in the room, quite irate reporting that her father had waited for an hour to be taken to the bathroom.  She denies she wished to get complaint, I did advise charge of the situation. ED Discharge Orders        Ordered    hydrocortisone (ANUSOL-HC) 25 MG suppository  2 times daily     07/21/17 1139       Charlesetta Shanks, MD 07/21/17 1152

## 2017-07-21 NOTE — ED Notes (Signed)
Bed: WA21 Expected date:  Expected time:  Means of arrival:  Comments: 77 yo M/Rectal Bleeding

## 2017-07-21 NOTE — ED Notes (Signed)
Patient transported to CT 

## 2017-07-23 ENCOUNTER — Other Ambulatory Visit: Payer: Self-pay | Admitting: Internal Medicine

## 2017-07-23 ENCOUNTER — Telehealth: Payer: Self-pay | Admitting: Internal Medicine

## 2017-07-23 DIAGNOSIS — K921 Melena: Secondary | ICD-10-CM

## 2017-07-23 NOTE — Telephone Encounter (Signed)
Returned patient's daughter's call. Referral placed to GI for colonoscopy. Daughter was also wondering about a referral to urology. Patient is already established at [facility], so he does not need a new referral from me. He just needs to call them to schedule an appointment and he can request a new provider if he wants. Patient's daughter voiced understanding. All questions answered.  [name], MD PGY-3

## 2017-07-23 NOTE — Telephone Encounter (Signed)
Daughter is calling and would like for the doctor to put in a referral for her dad to have a colonoscopy. He was in the ER a few days ago and was some bleeding from hemorrhoids and they suggested  That her father has this done. She was also checking on the status of a urology referral, I didn't see one in there. jw

## 2017-08-06 ENCOUNTER — Other Ambulatory Visit: Payer: Self-pay | Admitting: Internal Medicine

## 2017-08-06 DIAGNOSIS — I10 Essential (primary) hypertension: Secondary | ICD-10-CM

## 2017-08-23 ENCOUNTER — Other Ambulatory Visit: Payer: Self-pay | Admitting: Internal Medicine

## 2017-11-03 ENCOUNTER — Other Ambulatory Visit: Payer: Self-pay | Admitting: *Deleted

## 2017-11-03 MED ORDER — PHENYTOIN SODIUM EXTENDED 100 MG PO CAPS: 300.0000 mg | ORAL_CAPSULE | Freq: Every day | ORAL | 1 refills | Status: DC

## 2017-11-03 NOTE — Telephone Encounter (Signed)
Pt has an appointment on 12-19-17. Phillip Hill

## 2017-11-03 NOTE — Telephone Encounter (Signed)
Refill given. Please have patient schedule follow up appointment.

## 2017-12-08 ENCOUNTER — Other Ambulatory Visit: Payer: Self-pay

## 2017-12-08 MED ORDER — METOPROLOL SUCCINATE ER 25 MG PO TB24: 25.0000 mg | ORAL_TABLET | Freq: Every day | ORAL | 3 refills | Status: DC

## 2017-12-08 MED ORDER — VALACYCLOVIR HCL 500 MG PO TABS: ORAL_TABLET | ORAL | 1 refills | Status: DC

## 2017-12-08 NOTE — Telephone Encounter (Signed)
Refills given. Have patient make follow up appointment for HTN.

## 2017-12-12 ENCOUNTER — Ambulatory Visit: Payer: Medicare Other | Admitting: Neurology

## 2017-12-19 ENCOUNTER — Encounter: Payer: Self-pay | Admitting: Family Medicine

## 2017-12-19 ENCOUNTER — Ambulatory Visit (INDEPENDENT_AMBULATORY_CARE_PROVIDER_SITE_OTHER): Payer: Medicare Other | Admitting: Family Medicine

## 2017-12-19 ENCOUNTER — Other Ambulatory Visit: Payer: Self-pay

## 2017-12-19 VITALS — BP 108/58 | HR 106 | Temp 98.2°F | Wt 167.0 lb

## 2017-12-19 DIAGNOSIS — N3001 Acute cystitis with hematuria: Secondary | ICD-10-CM | POA: Diagnosis not present

## 2017-12-19 DIAGNOSIS — R109 Unspecified abdominal pain: Secondary | ICD-10-CM | POA: Insufficient documentation

## 2017-12-19 DIAGNOSIS — R300 Dysuria: Secondary | ICD-10-CM | POA: Diagnosis not present

## 2017-12-19 LAB — POCT URINALYSIS DIP (MANUAL ENTRY)
Glucose, UA: NEGATIVE mg/dL
NITRITE UA: POSITIVE — AB
Spec Grav, UA: 1.02 (ref 1.010–1.025)
Urobilinogen, UA: 1 E.U./dL
pH, UA: 5.5 (ref 5.0–8.0)

## 2017-12-19 LAB — POCT UA - MICROSCOPIC ONLY: WBC, Ur, HPF, POC: >20

## 2017-12-19 MED ORDER — CEPHALEXIN 500 MG PO CAPS: 500.0000 mg | ORAL_CAPSULE | Freq: Three times a day (TID) | ORAL | 0 refills | Status: AC

## 2017-12-19 MED ORDER — CEFTRIAXONE SODIUM 1 G IJ SOLR
1.0000 g | Freq: Once | INTRAMUSCULAR | Status: AC
  Administered 2017-12-19: 1 g via INTRAMUSCULAR

## 2017-12-19 NOTE — Assessment & Plan Note (Signed)
Patient presented with increased frequency and abdominal pain.  He denies hesitancy, dysuria.  Patient also reports dark appearing urine in the setting of chills and decreased appetite concerning for possible sepsis versus malignancy.  UA was positive with moderate leuks and nitrites.  Suspect UTI secondary to possible retention from from likely a large prostate.  Patient was treated  as above with Keflex and ceftriaxone.  He will follow-up with urology. --We will follow-up on urine culture and now down antibiotics as needed

## 2017-12-19 NOTE — Assessment & Plan Note (Signed)
Patient presents with diffuse abdominal pain for the past few days associated with some bloating and loose stools.  Patient endorses what appears to be chills yesterday.  Today vitals all consistent with prior beats with mild tachycardia noted  (chronic).  On exam patient is nontender to palpation.  Patient does not appear toxic but appears to be concerned with his general sense of well-being.  Decreased appetite patient age concerning for possible carcinoma however patient has gained weight in the past few months.  Patient was supposed to follow-up with GI for positive WBC likely secondary to hemorrhoid but never did so.  UA showed moderate leuks positive nitrite consistent with urinary tract infection.  Abdominal pain and chills likely secondary to acute cystitis.  Unclear etiology but given age suspect BPH.  Patient was supposed to follow-up with urology last office visit back in May 2019 but never did.  Patient also has a history of diverticulosis with no documented diverticulitis.  But given lack of pain unlikely to be so. --Gave ceftriaxone 1 g IM in clinic monitor patient given history of hives with penicillin. --Prescribed Keflex 500 mg 3 times daily for additional 5 days as this is categorized as complicated UTI. --Patient will follow up with urology. --Patient will also follow-up with GI to discuss history of hemorrhoid and positive FOBT in the past. --Order CBC, CMP

## 2017-12-19 NOTE — Progress Notes (Signed)
Subjective:    Patient ID: Phillip Hill, male    DOB: [birth year], 77 y.o.   MRN: [id number]   CC: Swollen belly and not feeling well  HPI: Patient is a 77 year old male with a complex past medical history who presents today complaining of increased abdominal girth and chills.  Patient reports that he has noted increased swelling in his belly.  It has worsened in the past 3 days.  Patient denies any similar presentation in the past.  Patient denies any abdominal pain, nausea, vomiting associated with his increased bili but reports loose stool/diarrhea since this morning with no evidence of blood.  He has had a history of FOBT positive thought to be secondary to hemorrhoids but never followed up with GI.  Patient also reports decreased appetite for the past few weeks.  But he has gained weight since last office visit.  Patient reports that yesterday afternoon he had chills and had to take a warm shower which helped but symptoms returned in the evening.  Patient also reports increased frequency but denies any dysuria.  This morning he has noticed that the urine has turned very dark.  Patient denies seeing any blood in his urine.  Patient was also supposed to follow-up with [facility] after last office visit but did not.  Patient overall is just not feeling well.  He denies any chest pain, shortness of breath, vision change, headaches.  Smoking status reviewed   ROS: all other systems were reviewed and are negative other than in the HPI   Past Medical History:  Diagnosis Date  . Arthritis   . Chronic cystitis   . Complex partial seizure disorder ([place])    began 1970's--  last seizure 1995  per neurologist note 10/ 2015--  dr Krista Blue  . Diverticulosis of colon   . Frequency of urination   . Hemorrhoids   . History of herpes simplex type 2 infection   . History of lower GI bleeding    secondary to ASA use--  resolved  . Hypertension   . Lazy eye of left side   . Nocturia   . PAC (premature  atrial contraction)   . Seizures ([place])   . Urgency of urination     Past Surgical History:  Procedure Laterality Date  . ARM AMPUTATION Right 1960   infection due to injury  . CHOLECYSTECTOMY N/A 10/22/2015   Procedure: LAPAROSCOPIC CHOLECYSTECTOMY WITH INTRAOPERATIVE CHOLANGIOGRAM;  Surgeon: Stark Klein, MD;  Location: [facility];  Service: General;  Laterality: N/A;  . COLONOSCOPY  10/24/2011   Procedure: COLONOSCOPY;  Surgeon: Milus Banister, MD;  Location: [facility];  Service: Endoscopy;  Laterality: N/A;  . CYSTOSCOPY WITH BIOPSY N/A 06/28/2014   Procedure: CYSTOSCOPY WITH BIOPSY;  Surgeon: Lowella Bandy, MD;  Location: [facility];  Service: Urology;  Laterality: N/A;  . INGUINAL HERNIA REPAIR Left 09-15-2009  . TRANSTHORACIC ECHOCARDIOGRAM  10-28-2013   dr Tressia Miners turner   mild focal basal hypertrophy of septum/  ef 73-22%/  grade I diastolic dysfunction/  mild AR/  trivial MR and TR    Past medical history, surgical, family, and social history reviewed and updated in the EMR as appropriate.  Objective:  BP (!) 108/58   Pulse (!) 106   Temp 98.2 F (36.8 C) (Oral)   Wt 167 lb (75.8 kg)   SpO2 97%   BMI 26.95 kg/m   Vitals and nursing note reviewed  General: NAD, pleasant, able to participate in exam Cardiac:  RRR, normal heart sounds, no murmurs. 2+ radial and PT pulses bilaterally Respiratory: CTAB, normal effort, No wheezes, rales or rhonchi Abdomen: soft, nontender, nondistended, no hepatic or splenomegaly, +BS Extremities: no edema or cyanosis. WWP. Skin: warm and dry, no rashes noted Neuro: alert and oriented x4, no focal deficits Psych: Normal affect and mood   Assessment & Plan:   Abdominal pain Patient presents with diffuse abdominal pain for the past few days associated with some bloating and loose stools.  Patient endorses what appears to be chills yesterday.  Today vitals all consistent with prior beats with mild tachycardia noted  (chronic).  On  exam patient is nontender to palpation.  Patient does not appear toxic but appears to be concerned with his general sense of well-being.  Decreased appetite patient age concerning for possible carcinoma however patient has gained weight in the past few months.  Patient was supposed to follow-up with GI for positive WBC likely secondary to hemorrhoid but never did so.  UA showed moderate leuks positive nitrite consistent with urinary tract infection.  Abdominal pain and chills likely secondary to acute cystitis.  Unclear etiology but given age suspect BPH.  Patient was supposed to follow-up with urology last office visit back in May 2019 but never did.  Patient also has a history of diverticulosis with no documented diverticulitis.  But given lack of pain unlikely to be so. --Gave ceftriaxone 1 g IM in clinic monitor patient given history of hives with penicillin. --Prescribed Keflex 500 mg 3 times daily for additional 5 days as this is categorized as complicated UTI. --Patient will follow up with urology. --Patient will also follow-up with GI to discuss history of hemorrhoid and positive FOBT in the past. --Order CBC, CMP  Dysuria Patient presented with increased frequency and abdominal pain.  He denies hesitancy, dysuria.  Patient also reports dark appearing urine in the setting of chills and decreased appetite concerning for possible sepsis versus malignancy.  UA was positive with moderate leuks and nitrites.  Suspect UTI secondary to possible retention from from likely a large prostate.  Patient was treated  as above with Keflex and ceftriaxone.  He will follow-up with urology. --We will follow-up on urine culture and now down antibiotics as needed   Marjie Skiff, MD [place] PGY-3

## 2017-12-19 NOTE — Patient Instructions (Signed)
It was great seeing you today! We have addressed the following issues today  1. You have a urinary tract infection. I will treat that for 5 days with an antibiotic. 2. I will refer you to GI.  3. You will call urology to follow up a code.  If we did any lab work today, and the results require attention, either me or my nurse will get in touch with you. If everything is normal, you will get a letter in mail and a message via . If you don't hear from [place] in two weeks, please give [place] a call. Otherwise, we look forward to seeing you again at your next visit. If you have any questions or concerns before then, please call the clinic at [phone].  Please bring all your medications to every doctors visit  Sign up for My Chart to have easy access to your labs results, and communication with your Primary care physician. Please ask Front Desk for some assistance.   Please check-out at the front desk before leaving the clinic.    Take Care,   Dr. Andy Gauss

## 2017-12-20 LAB — CBC WITH DIFFERENTIAL/PLATELET
BASOS: 0 %
Basophils Absolute: 0 10*3/uL (ref 0.0–0.2)
EOS (ABSOLUTE): 0 10*3/uL (ref 0.0–0.4)
Eos: 0 %
HEMATOCRIT: 42 % (ref 37.5–51.0)
Hemoglobin: 14.6 g/dL (ref 13.0–17.7)
IMMATURE GRANULOCYTES: 0 %
Immature Grans (Abs): 0 10*3/uL (ref 0.0–0.1)
Lymphocytes Absolute: 1.6 10*3/uL (ref 0.7–3.1)
Lymphs: 6 %
MCH: 33.6 pg — ABNORMAL HIGH (ref 26.6–33.0)
MCHC: 34.8 g/dL (ref 31.5–35.7)
MCV: 97 fL (ref 79–97)
Monocytes Absolute: 1.2 10*3/uL — ABNORMAL HIGH (ref 0.1–0.9)
Monocytes: 5 %
NEUTROS ABS: 22.4 10*3/uL — AB (ref 1.4–7.0)
NEUTROS PCT: 89 %
Platelets: 234 10*3/uL (ref 150–450)
RBC: 4.35 x10E6/uL (ref 4.14–5.80)
RDW: 14.2 % (ref 12.3–15.4)
WBC: 25.4 10*3/uL (ref 3.4–10.8)

## 2017-12-20 LAB — CMP14+EGFR
ALT: 29 IU/L (ref 0–44)
AST: 31 IU/L (ref 0–40)
Albumin/Globulin Ratio: 1.2 (ref 1.2–2.2)
Albumin: 4.1 g/dL (ref 3.5–4.8)
Alkaline Phosphatase: 117 IU/L (ref 39–117)
BUN/Creatinine Ratio: 10 (ref 10–24)
BUN: 14 mg/dL (ref 8–27)
Bilirubin Total: 0.6 mg/dL (ref 0.0–1.2)
CO2: 20 mmol/L (ref 20–29)
Calcium: 9.9 mg/dL (ref 8.6–10.2)
Chloride: 99 mmol/L (ref 96–106)
Creatinine, Ser: 1.35 mg/dL — ABNORMAL HIGH (ref 0.76–1.27)
GFR calc Af Amer: 58 mL/min/{1.73_m2} — ABNORMAL LOW (ref >59)
GFR calc non Af Amer: 50 mL/min/{1.73_m2} — ABNORMAL LOW (ref >59)
GLOBULIN, TOTAL: 3.3 g/dL (ref 1.5–4.5)
Glucose: 200 mg/dL — ABNORMAL HIGH (ref 65–99)
Potassium: 4.6 mmol/L (ref 3.5–5.2)
SODIUM: 137 mmol/L (ref 134–144)
Total Protein: 7.4 g/dL (ref 6.0–8.5)

## 2017-12-22 DIAGNOSIS — N528 Other male erectile dysfunction: Secondary | ICD-10-CM | POA: Diagnosis not present

## 2017-12-22 DIAGNOSIS — R351 Nocturia: Secondary | ICD-10-CM | POA: Diagnosis not present

## 2017-12-22 DIAGNOSIS — N3080 Other cystitis without hematuria: Secondary | ICD-10-CM | POA: Diagnosis not present

## 2017-12-22 DIAGNOSIS — N401 Enlarged prostate with lower urinary tract symptoms: Secondary | ICD-10-CM | POA: Diagnosis not present

## 2017-12-22 DIAGNOSIS — R350 Frequency of micturition: Secondary | ICD-10-CM | POA: Diagnosis not present

## 2017-12-25 LAB — URINE CULTURE

## 2018-04-26 ENCOUNTER — Other Ambulatory Visit: Payer: Self-pay | Admitting: Family Medicine

## 2018-04-26 DIAGNOSIS — I10 Essential (primary) hypertension: Secondary | ICD-10-CM

## 2018-04-27 MED ORDER — HYDRALAZINE HCL 25 MG PO TABS: 25.0000 mg | ORAL_TABLET | Freq: Two times a day (BID) | ORAL | 2 refills | Status: DC

## 2018-04-27 NOTE — Telephone Encounter (Signed)
Refills provided. Please have patient make appointment to establish with new PCP. 

## 2018-06-16 ENCOUNTER — Ambulatory Visit: Payer: Medicare Other | Admitting: Family Medicine

## 2018-06-17 ENCOUNTER — Other Ambulatory Visit: Payer: Self-pay | Admitting: Family Medicine

## 2018-10-27 ENCOUNTER — Other Ambulatory Visit: Payer: Self-pay | Admitting: Family Medicine

## 2018-10-28 NOTE — Telephone Encounter (Signed)
Refill provided. Patient needs to be seen for follow up. Please let him know.

## 2018-12-11 ENCOUNTER — Other Ambulatory Visit: Payer: Self-pay

## 2018-12-11 MED ORDER — VALACYCLOVIR HCL 500 MG PO TABS: 500.0000 mg | ORAL_TABLET | Freq: Every day | ORAL | 1 refills | Status: DC

## 2018-12-11 MED ORDER — METOPROLOL SUCCINATE ER 25 MG PO TB24: 25.0000 mg | ORAL_TABLET | Freq: Every day | ORAL | 3 refills | Status: DC

## 2019-01-19 ENCOUNTER — Other Ambulatory Visit: Payer: Self-pay | Admitting: *Deleted

## 2019-01-19 MED ORDER — PHENYTOIN SODIUM EXTENDED 100 MG PO CAPS: 300.0000 mg | ORAL_CAPSULE | Freq: Every day | ORAL | 0 refills | Status: DC

## 2019-01-19 NOTE — Telephone Encounter (Signed)
Please help patient make appointment to meet new PCP. Refill sent to pharmacy. Thanks!

## 2019-02-04 ENCOUNTER — Other Ambulatory Visit: Payer: Self-pay | Admitting: Family Medicine

## 2019-02-04 DIAGNOSIS — I10 Essential (primary) hypertension: Secondary | ICD-10-CM

## 2019-02-04 NOTE — Telephone Encounter (Signed)
Refill provided. Please have patient follow up for blood pressure before further refills can be given. Thanks!!

## 2019-02-04 NOTE — Telephone Encounter (Signed)
Patient has an appt on 02-08-2019. Jazmin Hartsell,CMA

## 2019-02-04 NOTE — Telephone Encounter (Signed)
Attempted to reach pt. Phone just rang. No VM set up. Will try later on today. Salvatore Marvel, CMA

## 2019-02-08 ENCOUNTER — Encounter: Payer: Self-pay | Admitting: Family Medicine

## 2019-02-08 ENCOUNTER — Other Ambulatory Visit: Payer: Self-pay

## 2019-02-08 ENCOUNTER — Ambulatory Visit (INDEPENDENT_AMBULATORY_CARE_PROVIDER_SITE_OTHER): Payer: Medicare Other | Admitting: Family Medicine

## 2019-02-08 VITALS — BP 152/78 | HR 107 | Wt 163.0 lb

## 2019-02-08 DIAGNOSIS — I10 Essential (primary) hypertension: Secondary | ICD-10-CM

## 2019-02-08 DIAGNOSIS — K623 Rectal prolapse: Secondary | ICD-10-CM | POA: Insufficient documentation

## 2019-02-08 DIAGNOSIS — N3001 Acute cystitis with hematuria: Secondary | ICD-10-CM | POA: Diagnosis not present

## 2019-02-08 DIAGNOSIS — Z0000 Encounter for general adult medical examination without abnormal findings: Secondary | ICD-10-CM

## 2019-02-08 DIAGNOSIS — N400 Enlarged prostate without lower urinary tract symptoms: Secondary | ICD-10-CM | POA: Insufficient documentation

## 2019-02-08 DIAGNOSIS — G40909 Epilepsy, unspecified, not intractable, without status epilepticus: Secondary | ICD-10-CM | POA: Diagnosis not present

## 2019-02-08 DIAGNOSIS — R350 Frequency of micturition: Secondary | ICD-10-CM

## 2019-02-08 DIAGNOSIS — N401 Enlarged prostate with lower urinary tract symptoms: Secondary | ICD-10-CM | POA: Diagnosis not present

## 2019-02-08 DIAGNOSIS — D1779 Benign lipomatous neoplasm of other sites: Secondary | ICD-10-CM

## 2019-02-08 LAB — POCT UA - MICROSCOPIC ONLY

## 2019-02-08 LAB — POCT URINALYSIS DIP (MANUAL ENTRY)
Bilirubin, UA: NEGATIVE
Blood, UA: NEGATIVE
Glucose, UA: NEGATIVE mg/dL
Ketones, POC UA: NEGATIVE mg/dL
Nitrite, UA: POSITIVE — AB
Protein Ur, POC: 30 mg/dL — AB
Spec Grav, UA: 1.02 (ref 1.010–1.025)
Urobilinogen, UA: 0.2 E.U./dL
pH, UA: 5.5 (ref 5.0–8.0)

## 2019-02-08 NOTE — Assessment & Plan Note (Signed)
Well controlled on current regimen, will continue.

## 2019-02-08 NOTE — Assessment & Plan Note (Addendum)
Slightly above age-related goal. With some reported orthostatic symptoms, will not adjust regimen today. Per chart review, Cardiology switched lisinopril to hydralazine and metoprolol in 2015 due to an irregular heart beat. Not currently appreciated on today's exam. Also with regular rate on my exam. Would consider obtaining EKG at follow up and transition hydralazine back to ARB or referring to Cardiology if concerns arise.

## 2019-02-08 NOTE — Assessment & Plan Note (Addendum)
Resolved s/p antibiotics, initially noted 1 year ago. No longer symptomatic. Obtained repeat UA to determine resolution of hematuria. UA negative for RBC but did show +leuks, nitrites, and bacteria. Nontender prostate exam. Given patient is not currently symptomatic, would not recommend antibiotic treatment at this time.

## 2019-02-08 NOTE — Assessment & Plan Note (Signed)
Visualized on exam. Currently with good bowel regimen. Will refer to general surgery.

## 2019-02-08 NOTE — Progress Notes (Signed)
Subjective:   Patient ID: Phillip Hill    DOB: 08/23/1940, 78 y.o. male   MRN: [id number]  Kaven Deltoro is a 78 y.o. male with a history of HTN, seizure d/o, diverticulosis, upper limb amputation, HSV-2, BPH here for   Hypertension: - Medications: Hydralazine 25 mg twice daily, metoprolol 25 mg daily  - previously was on lisinopril but changed to above by Cardiology in 2015. - Compliance: good - Checking BP at home: no - Denies any SOB, CP, vision changes, LE edema, medication SEs. - Sometimes will get dizzy when going from sitting to standing too fast. - Exercise: not much. Will walk occasionally.  Seizure disorder - Medications: Dilantin 300 mg nightly - Seizures typically characterized by: getting up and walking or running around during sleep. - Last seizure: >20 years ago - >49yr since seeing neurologist.  Rectal problem - reports hemorrhoid present for many years. Typically never had many problems with it. - recently has been experiencing some pain and bright red blood noticed on toilet paper when wiping. No blood mixed with stool. - has been using OTC suppositories for hemorrhoids - has BM every day, soft  BPH - followed by Urology, last appointment 12/22/2017. Was given a medication at that time but didn't tolerate so he stopped taking it. Doesn't remember the name of it. - No pain with urinating - urinates every 30 minutes or so.  - feels he is able to empty his bladder well. - asks for medication for ED.  Healthcare Maintenance - Vaccines: flu, tdap, pneumococcal  Review of Systems:  Per HPI.  Medications and smoking status reviewed.  Objective:   BP (!) 152/78   Pulse (!) 107   Wt 163 lb (73.9 kg)   SpO2 98%   BMI 26.31 kg/m  Vitals and nursing note reviewed.  General: well nourished, well developed, in no acute distress with non-toxic appearance CV: regular rate and rhythm without murmurs, rubs, or gallops, no lower extremity edema Lungs: clear to auscultation  bilaterally with normal work of breathing Rectal exam: large L sided rectal prolapse, no tenderness noted.  Prostate exam: smooth and symmetric without nodules or tenderness, enlarged. Skin: large, ~3-4in lipoma to L hip  Assessment & Plan:   Hypertension Slightly above age-related goal. With some reported orthostatic symptoms, will not adjust regimen today. Per chart review, Cardiology switched lisinopril to hydralazine and metoprolol in 2015 due to an irregular heart beat. Not currently appreciated on today's exam. Also with regular rate on my exam. Would consider obtaining EKG at follow up and transition hydralazine back to ARB or referring to Cardiology if concerns arise.  Seizure disorder Well controlled on current regimen, will continue.   Health care maintenance Declined flu, pneumococcal, and Tdap vaccinations today. After discussion, elected to obtain follow up lipid panel given prior elevated ASCVD risk.  Acute cystitis with hematuria Resolved s/p antibiotics, initially noted 1 year ago. No longer symptomatic. Obtained repeat UA to determine resolution of hematuria. UA negative for RBC but did show +leuks, nitrites, and bacteria. Nontender prostate exam. Given patient is not currently symptomatic, would not recommend antibiotic treatment at this time.   BPH (benign prostatic hyperplasia) Enlarged prostate on rectal exam today but nontender. With urinary frequency. Previously given medication by Urology but did not tolerate this. Discussed starting medication today but would like to receive prior records from Urology prior to prescribing or if patient prefers, can follow up with Urology.  Partial rectal prolapse Visualized on exam. Currently with good bowel  regimen. Will refer to general surgery.  Orders Placed This Encounter  Procedures  . Basic Metabolic Panel  . Lipid Panel  . Ambulatory referral to General Surgery    Referral Priority:   Routine    Referral Type:    Surgical    Referral Reason:   Specialty Services Required    Requested Specialty:   General Surgery    Number of Visits Requested:   1  . POCT urinalysis dipstick  . POCT UA - Microscopic Only   No orders of the defined types were placed in this encounter.   [name], DO PGY-3, [facility] 02/08/2019 6:44 PM

## 2019-02-08 NOTE — Patient Instructions (Signed)
It was great to see you!  Our plans for today:  - We are referring you to general surgery for your prolapsed rectum. Someone will give you a call about this appointment. - It would be a good idea to go back to see the Urologist for your urinary frequency workup. We will try to obtain your prior records.  - No changes to your medications for now.   We are checking some labs today, we will call you or send you a letter if they are abnormal.   Take care and seek immediate care sooner if you develop any concerns.   Dr. [facility]

## 2019-02-08 NOTE — Assessment & Plan Note (Signed)
Declined flu, pneumococcal, and Tdap vaccinations today. After discussion, elected to obtain follow up lipid panel given prior elevated ASCVD risk.

## 2019-02-08 NOTE — Assessment & Plan Note (Addendum)
Enlarged prostate on rectal exam today but nontender. With urinary frequency. Previously given medication by Urology but did not tolerate this. Discussed starting medication today but would like to receive prior records from Urology prior to prescribing or if patient prefers, can follow up with Urology.

## 2019-02-09 LAB — BASIC METABOLIC PANEL
BUN/Creatinine Ratio: 9 — ABNORMAL LOW (ref 10–24)
BUN: 9 mg/dL (ref 8–27)
CO2: 20 mmol/L (ref 20–29)
Calcium: 10 mg/dL (ref 8.6–10.2)
Chloride: 103 mmol/L (ref 96–106)
Creatinine, Ser: 1.02 mg/dL (ref 0.76–1.27)
GFR calc Af Amer: 80 mL/min/{1.73_m2} (ref >59)
GFR calc non Af Amer: 70 mL/min/{1.73_m2} (ref >59)
Glucose: 107 mg/dL — ABNORMAL HIGH (ref 65–99)
Potassium: 4.1 mmol/L (ref 3.5–5.2)
Sodium: 138 mmol/L (ref 134–144)

## 2019-02-09 LAB — LIPID PANEL
Chol/HDL Ratio: 3 ratio (ref 0.0–5.0)
Cholesterol, Total: 153 mg/dL (ref 100–199)
HDL: 51 mg/dL (ref >39)
LDL Chol Calc (NIH): 86 mg/dL (ref 0–99)
Triglycerides: 87 mg/dL (ref 0–149)
VLDL Cholesterol Cal: 16 mg/dL (ref 5–40)

## 2019-03-08 ENCOUNTER — Ambulatory Visit: Payer: Self-pay | Admitting: Surgery

## 2019-03-08 DIAGNOSIS — S48011S Complete traumatic amputation at right shoulder joint, sequela: Secondary | ICD-10-CM

## 2019-03-08 DIAGNOSIS — R351 Nocturia: Secondary | ICD-10-CM | POA: Diagnosis not present

## 2019-03-08 DIAGNOSIS — M7989 Other specified soft tissue disorders: Secondary | ICD-10-CM | POA: Diagnosis not present

## 2019-03-08 DIAGNOSIS — N401 Enlarged prostate with lower urinary tract symptoms: Secondary | ICD-10-CM | POA: Diagnosis not present

## 2019-03-08 DIAGNOSIS — K641 Second degree hemorrhoids: Secondary | ICD-10-CM | POA: Diagnosis not present

## 2019-03-08 DIAGNOSIS — K642 Third degree hemorrhoids: Secondary | ICD-10-CM | POA: Diagnosis not present

## 2019-03-08 DIAGNOSIS — K643 Fourth degree hemorrhoids: Secondary | ICD-10-CM | POA: Diagnosis not present

## 2019-03-08 NOTE — H&P (Signed)
Phillip Hill Documented: 03/08/2019 10:19 AM Location: [facility] Patient #: [id number] DOB: 12/15/1940 Divorced / Language: Phillip Hill / Race: Black or African American Male  History of Present Illness Adin Hector MD; 03/08/2019 1:26 PM) The patient is a 78 year old male who presents with a complaint of anal problems. Note for "Anal problems": ` ` ` Patient sent for surgical consultation at the request of [name], DO. [facility]  Chief Complaint: Prolapsing rectal tissue. ` ` The patient is a gentleman comes by himself. He's had some intermittent rectal bleeding for many years. He was admitted 2013. He had a colonoscopy - Noted diverticulosis. Looks like he had some redundant anorectal tissue on retroflexion view but not severely noted. Ten-year follow-up recommended. He feels like something is out all the time. He noticed blood when he wipes. No major clots. No symptomatically anemia. Saw primary care office. Rectal partial prolapse suspected. Surgical consultation requested.  Had cholecystectomy 2017 by Dr. [name]. Had inguinal hernia repair by Dr. [name] in 2011 by our group. He has a right upper extremity prosthesis and his shoulder since he was a teenager. Patient also notes he's had a lump on his left back hip that has been slowly getting bigger for "a while". It is gotten larger and more bothersome. Primary care office suspected lipoma. He wonders if that can be removed. Patient does not have any incontinence to flatus or stool. He can gets up and urinates at night several times. He apparently saw urology last year. He cannot recall the medication was given to him that he did note that helped and he stopped. He has not follow-up since. He is not blood thinners. He does not smoke. He 20 minutes without difficulty. No history of cardiac issues. Distant history of seizures. Nothing recently.  No personal nor family history of  GI/colon cancer, inflammatory bowel disease, irritable bowel syndrome, allergy such as Celiac Sprue, dietary/dairy problems, colitis, ulcers nor gastritis. No recent sick contacts/gastroenteritis. No travel outside the country. No changes in diet. No dysphagia to solids or liquids. No significant heartburn or reflux. No hematemesis, coffee ground emesis. No evidence of prior gastric/peptic ulceration.  (Review of systems as stated in this history (HPI) or in the review of systems. Otherwise all other 12 point ROS are negative) ` ` `   Past Surgical History [name], [name]; 03/08/2019 10:19 AM) Open Inguinal Hernia Surgery Left.  Diagnostic Studies History [name], [place]; 03/08/2019 10:19 AM) Colonoscopy 5-10 years ago  Allergies [name], CMA; 03/08/2019 10:20 AM) Ciprofloxacin *CHEMICALS* Penicillins Allergies Reconciled  Medication History [name], CMA; 03/08/2019 10:20 AM) ValACYclovir HCl (500MG  Tablet, Oral) Active. Phenytoin Sodium Extended (100MG  Capsule, Oral) Active. HydrALAZINE HCl (25MG  Tablet, Oral) Active. Metoprolol Tartrate (50MG  Tablet, Oral) Active. Medications Reconciled  Social History [name], [place]; 03/08/2019 10:19 AM) Alcohol use Remotely quit alcohol use. Caffeine use Carbonated beverages, Coffee. No drug use Tobacco use Former smoker.  Family History [name], [place]; 03/08/2019 10:19 AM) Cervical Cancer Mother, Sister.  Other Problems [name], CMA; 03/08/2019 10:19 AM) Anxiety Disorder Arthritis Back Pain Diverticulosis Enlarged Prostate Hemorrhoids High blood pressure Inguinal Hernia Kidney Stone     Review of Systems [name] CMA; 03/08/2019 10:19 AM) General Present- Appetite Loss, Fatigue, Night Sweats and Weight Gain. Not Present- Chills, Fever and Weight Loss. Skin Present- Dryness and Hives. Not Present- Change in Wart/Mole, Jaundice, New  Lesions, Non-Healing Wounds, Rash and Ulcer. HEENT Present- Seasonal Allergies and Visual Disturbances.  Not Present- Earache, Hearing Loss, Hoarseness, Nose Bleed, Oral Ulcers, Ringing in the Ears, Sinus Pain, Sore Throat, Wears glasses/contact lenses and Yellow Eyes. Breast Not Present- Breast Mass, Breast Pain, Nipple Discharge and Skin Changes. Cardiovascular Present- Leg Cramps and Palpitations. Not Present- Chest Pain, Difficulty Breathing Lying Down, Rapid Heart Rate, Shortness of Breath and Swelling of Extremities. Gastrointestinal Present- Bloating, Excessive gas, Hemorrhoids and Rectal Pain. Not Present- Abdominal Pain, Bloody Stool, Change in Bowel Habits, Chronic diarrhea, Constipation, Difficulty Swallowing, Gets full quickly at meals, Indigestion, Nausea and Vomiting. Male Genitourinary Present- Frequency, Nocturia, Urgency and Urine Leakage. Not Present- Blood in Urine, Change in Urinary Stream, Impotence and Painful Urination. Musculoskeletal Present- Back Pain, Joint Pain, Joint Stiffness and Muscle Pain. Not Present- Muscle Weakness and Swelling of Extremities. Neurological Present- Numbness, Seizures and Trouble walking. Not Present- Decreased Memory, Fainting, Headaches, Tingling, Tremor and Weakness. Psychiatric Present- Anxiety. Not Present- Bipolar, Change in Sleep Pattern, Depression, Fearful and Frequent crying. Endocrine Not Present- Cold Intolerance, Excessive Hunger, Hair Changes, Heat Intolerance, Hot flashes and New Diabetes. Hematology Not Present- Blood Thinners, Easy Bruising, Excessive bleeding, Gland problems, HIV and Persistent Infections.  Vitals [name] CMA; 03/08/2019 10:20 AM) 03/08/2019 10:20 AM Weight: 165.4 lb Height: 66in Body Surface Area: 1.85 m Body Mass Index: 26.7 kg/m  Temp.: 98.7F  Pulse: 117 (Regular)  BP: 150/82 (Sitting, Left Arm, Standard)        Physical Exam Adin Hector MD; 03/08/2019 10:52  AM)  General Mental Status-Alert. General Appearance-Not in acute distress, Not Sickly. Orientation-Oriented X3. Hydration-Well hydrated. Voice-Normal.  Integumentary Global Assessment Upon inspection and palpation of skin surfaces of the - Axillae: non-tender, no inflammation or ulceration, no drainage. and Distribution of scalp and body hair is normal. General Characteristics Temperature - normal warmth is noted.  Head and Neck Head-normocephalic, atraumatic with no lesions or palpable masses. Face Global Assessment - atraumatic, no absence of expression. Neck Global Assessment - no abnormal movements, no bruit auscultated on the right, no bruit auscultated on the left, no decreased range of motion, non-tender. Trachea-midline. Thyroid Gland Characteristics - non-tender.  Eye Eyeball - Left-Extraocular movements intact, No Nystagmus - Left. Eyeball - Right-Extraocular movements intact, No Nystagmus - Right. Cornea - Left-No Hazy - Left. Cornea - Right-No Hazy - Right. Sclera/Conjunctiva - Left-No scleral icterus, No Discharge - Left. Sclera/Conjunctiva - Right-No scleral icterus, No Discharge - Right. Pupil - Left-Direct reaction to light normal. Pupil - Right-Direct reaction to light normal. Note: Left thigh with chronic lateral gaze  ENMT Ears Pinna - Left - no drainage observed, no generalized tenderness observed. Pinna - Right - no drainage observed, no generalized tenderness observed. Nose and Sinuses External Inspection of the Nose - no destructive lesion observed. Inspection of the nares - Left - quiet respiration. Inspection of the nares - Right - quiet respiration. Mouth and Throat Lips - Upper Lip - no fissures observed, no pallor noted. Lower Lip - no fissures observed, no pallor noted. Nasopharynx - no discharge present. Oral Cavity/Oropharynx - Tongue - no dryness observed. Oral Mucosa - no cyanosis observed. Hypopharynx - no  evidence of airway distress observed.  Chest and Lung Exam Inspection Movements - Normal and Symmetrical. Accessory muscles - No use of accessory muscles in breathing. Palpation Palpation of the chest reveals - Non-tender. Auscultation Breath sounds - Normal and Clear.  Cardiovascular Auscultation Rhythm - Regular. Murmurs & Other Heart Sounds - Auscultation of the heart reveals - No Murmurs and No Systolic Clicks.  Abdomen Inspection Inspection of the abdomen reveals - No Visible peristalsis and No Abnormal pulsations. Umbilicus - No Bleeding, No Urine drainage. Palpation/Percussion Palpation and Percussion of the abdomen reveal - Soft, Non Tender, No Rebound tenderness, No Rigidity (guarding) and No Cutaneous hyperesthesia. Note: Abdomen soft. Well-healed laparoscopic incisions without hernias. Nontender. Not distended. No umbilical or incisional hernias. No guarding.  Male Genitourinary Sexual Maturity Tanner 5 - Adult hair pattern and Adult penile size and shape. Note: No inguinal hernias. Normal external genitalia. Epididymi, testes, and spermatic cords normal without any masses.  Rectal Note: Please refer to anoscopy section.  Left lateral chronically prolapsed large hemorrhoid. Grade 2 almost 3 right posterior hemorrhoid. Grade 2 right anterior hemorrhoid. Soft firm stool in rectal vault moderate volume. Normal sphincter tone. Tolerated digital and anoscopic exam. Examination done with certified medical assistant in room. Enlarged bilateral lobes of prostate that stones. No fissure. No fistula. No abscess. No pilonidal disease. No condyloma.  Peripheral Vascular Upper Extremity Inspection - Left - No Cyanotic nailbeds - Left, Not Ischemic. Inspection - Right - No Cyanotic nailbeds - Right, Not Ischemic.  Neurologic Neurologic evaluation reveals -normal attention span and ability to concentrate, able to name objects and repeat phrases. Appropriate fund of knowledge  , normal sensation and normal coordination. Mental Status Affect - not angry, not paranoid. Cranial Nerves-Normal Bilaterally. Gait-Normal.  Neuropsychiatric Mental status exam performed with findings of-able to articulate well with normal speech/language, rate, volume and coherence, thought content normal with ability to perform basic computations and apply abstract reasoning and no evidence of hallucinations, delusions, obsessions or homicidal/suicidal ideation.  Musculoskeletal Global Assessment Spine, Ribs and Pelvis - no instability, subluxation or laxity. Right Upper Extremity - no instability, subluxation or laxity. Note: 12 x 10 cm ellipsoid mass on left posterior hip/flank. Soft, most likely consistent with lipoma.  Right upper extremity prosthesis to upper arm.  Lymphatic Head & Neck  General Head & Neck Lymphatics: Bilateral - Description - No Localized lymphadenopathy. Axillary  General Axillary Region: Bilateral - Description - No Localized lymphadenopathy. Femoral & Inguinal  Generalized Femoral & Inguinal Lymphatics: Left - Description - No Localized lymphadenopathy. Right - Description - No Localized lymphadenopathy.   Results Adin Hector MD; 03/08/2019 11:06 AM) Procedures  Name Value Date Hemorrhoids Procedure Anal exam: prolapse Internal exam: Internal Hemorrhoids (bleeding) prolapse Other: Left lateral chronically prolapsed large hemorrhoid. Grade 2 almost 3 right posterior hemorrhoid. Grade 2 right anterior hemorrhoid. ...........Marland KitchenSoft firm stool in rectal vault moderate volume. Normal sphincter tone. Tolerated digital and anoscopic exam. Examination done with certified medical assistant in room. Enlarged bilateral lobes of prostate that stones. No fissure. No fistula. No abscess. No pilonidal disease. No condyloma.  Performed: 03/08/2019 10:51 AM    Assessment & Plan Adin Hector MD; 03/08/2019 11:06  AM)  PROLAPSED INTERNAL HEMORRHOIDS, GRADE 4 (K64.3) Impression: Large left lateral chronically prolapsed hemorrhoid. This seems to be consistent with his blood with wiping and discomfort. Not a true circumferential prolapse.  I think this requires surgery to remove. Banding will be insufficient. He is interested in proceeding. This will not I proceeded to do a ligation of pexy of the other 2 piles. Possible hemorrhoidectomy there as well. We will see.  Underwent colonoscopy 2013. I noted if the bleeding persists after hemorrhoid surgery, reconsider colonoscopy. Hopefully this should help take care of the issue.  Current Plans ANOSCOPY, DIAGNOSTIC [id number]) Pt Education - Pamphlet Given - The Hemorrhoid Book: discussed with patient and provided information. The  anatomy & physiology of the anorectal region was discussed. The pathophysiology of hemorrhoids and differential diagnosis was discussed. Natural history risks without surgery was discussed. I stressed the importance of a bowel regimen to have daily soft bowel movements to minimize progression of disease. Interventions such as sclerotherapy & banding were discussed.  The patient's symptoms are not adequately controlled by medicines and other non-operative treatments. I feel the risks & problems of no surgery outweigh the operative risks; therefore, I recommended surgery to treat the hemorrhoids by ligation, pexy, and possible resection.  Risks such as bleeding, infection, urinary difficulties, need for further treatment, heart attack, death, and other risks were discussed. I noted a good likelihood this will help address the problem. Goals of post-operative recovery were discussed as well. Possibility that this will not correct all symptoms was explained. Post-operative pain, bleeding, constipation, and other problems after surgery were discussed. We will work to minimize complications. Educational handouts further explaining the  pathology, treatment options, and bowel regimen were given as well. Questions were answered. The patient expresses understanding & wishes to proceed with surgery.   ENCOUNTER FOR PREOPERATIVE EXAMINATION FOR GENERAL SURGICAL PROCEDURE (Z01.818)  Current Plans You are being scheduled for surgery- Our schedulers will call you.  You should hear from our office's scheduling department within 5 working days about the location, date, and time of surgery. We try to make accommodations for patient's preferences in scheduling surgery, but sometimes the OR schedule or the surgeon's schedule prevents [place] from making those accommodations.  If you have not heard from our office [phone]) in 5 working days, call the office and ask for your surgeon's nurse.  If you have other questions about your diagnosis, plan, or surgery, call the office and ask for your surgeon's nurse.  Pt Education - [facility] Rectal Prep for Anorectal outpatient/office surgery: discussed with patient and provided information. Pt Education - [facility] Rectal Surgery HCI (Kateri Balch): discussed with patient and provided information.  PROLAPSED INTERNAL HEMORRHOIDS, GRADE 3 (K64.2) Impression: The anatomy & physiology of the anorectal region was discussed. The pathophysiology of hemorrhoids and differential diagnosis was discussed. Natural history progression was discussed. I stressed the importance of a bowel regimen to have daily soft bowel movements to minimize progression of disease. Goal of one BM / day ideal. Use of wet wipes, warm baths, avoiding straining, etc were emphasized.  Educational handouts further explaining the pathology, treatment options, and bowel regimen were given as well. The patient expressed understanding.  Current Plans Pt Education - [facility] Hemorrhoids (Brucha Ahlquist): discussed with patient and provided information.  PROLAPSED INTERNAL HEMORRHOIDS, GRADE 2 (K64.1)   PALPABLE MASS OF SOFT TISSUE OF HIP (M79.89) Impression:  Large left posterior hip/flank mass. SQ soft. There for many years and slowly enlarging.  Seems Is Consistent with a Lipoma.  Because It Is Getting Larger and Bothering Him, He Would like to Consider Having Removed. I Noted I Cannot Do at the Same Time as the Hemorrhoid Surgery. Because the Hemorrhoid Is Bothering Him More, He Would like to Proceed with Hemorrhoid Surgery and Then Revisit the Idea of Resection of the Large Mass. Most Likely Would Be Outpatient Surgery and Would Require a Drain Temporarily to Decrease Postoperative Seroma.  Current Plans The pathophysiology of skin & subcutaneous masses was discussed. Natural history risks without surgery were discussed. I recommended surgery to remove the mass. I explained the technique of removal with use of local anesthesia & possible need for more aggressive sedation/anesthesia for patient comfort.  Risks such  as bleeding, infection, wound breakdown, heart attack, death, and other risks were discussed. I noted a good likelihood this will help address the problem. Possibility that this will not correct all symptoms was explained. Possibility of regrowth/recurrence of the mass was discussed. We will work to minimize complications. Questions were answered. The patient expresses understanding & wishes to proceed with surgery.   BENIGN PROSTATIC HYPERPLASIA WITH NOCTURIA (N40.1) Impression: He does have a large prostate. He is at risk for urinary retention postoperatively. I encouraged him to consider seeing urologist about this. We'll defer to primary care. I believe there was a tentative recommendation to see Dr. Leida Lauth, MD, FACS, MASCRS Gastrointestinal and Minimally Invasive Surgery  [facility] [address], [place], [place] [address] [phone] [address] / Paging [phone] Fax

## 2019-03-16 ENCOUNTER — Other Ambulatory Visit: Payer: Self-pay | Admitting: Family Medicine

## 2019-03-16 DIAGNOSIS — I10 Essential (primary) hypertension: Secondary | ICD-10-CM

## 2019-04-24 ENCOUNTER — Other Ambulatory Visit: Payer: Self-pay | Admitting: Family Medicine

## 2019-04-24 DIAGNOSIS — I10 Essential (primary) hypertension: Secondary | ICD-10-CM

## 2019-04-27 NOTE — Telephone Encounter (Signed)
Appt made for 05/03/19. Salvatore Marvel, CMA

## 2019-04-30 DIAGNOSIS — Z01818 Encounter for other preprocedural examination: Secondary | ICD-10-CM | POA: Diagnosis not present

## 2019-05-03 ENCOUNTER — Ambulatory Visit: Payer: Medicare Other | Admitting: Family Medicine

## 2019-05-06 ENCOUNTER — Other Ambulatory Visit: Payer: Self-pay | Admitting: Surgery

## 2019-05-06 DIAGNOSIS — K643 Fourth degree hemorrhoids: Secondary | ICD-10-CM | POA: Diagnosis not present

## 2019-05-06 DIAGNOSIS — K644 Residual hemorrhoidal skin tags: Secondary | ICD-10-CM | POA: Diagnosis not present

## 2019-05-06 DIAGNOSIS — K649 Unspecified hemorrhoids: Secondary | ICD-10-CM | POA: Diagnosis not present

## 2019-05-26 ENCOUNTER — Other Ambulatory Visit: Payer: Self-pay

## 2019-05-26 ENCOUNTER — Encounter: Payer: Self-pay | Admitting: Family Medicine

## 2019-05-26 ENCOUNTER — Ambulatory Visit (INDEPENDENT_AMBULATORY_CARE_PROVIDER_SITE_OTHER): Payer: Medicare Other | Admitting: Family Medicine

## 2019-05-26 VITALS — BP 140/68 | HR 100 | Ht 66.0 in | Wt 163.0 lb

## 2019-05-26 DIAGNOSIS — K623 Rectal prolapse: Secondary | ICD-10-CM

## 2019-05-26 DIAGNOSIS — I10 Essential (primary) hypertension: Secondary | ICD-10-CM

## 2019-05-26 DIAGNOSIS — Z0100 Encounter for examination of eyes and vision without abnormal findings: Secondary | ICD-10-CM

## 2019-05-26 DIAGNOSIS — Z23 Encounter for immunization: Secondary | ICD-10-CM | POA: Diagnosis not present

## 2019-05-26 DIAGNOSIS — N400 Enlarged prostate without lower urinary tract symptoms: Secondary | ICD-10-CM

## 2019-05-26 MED ORDER — TETANUS-DIPHTH-ACELL PERTUSSIS 5-2.5-18.5 LF-MCG/0.5 IM SUSP: 0.5000 mL | Freq: Once | INTRAMUSCULAR | 0 refills | Status: AC

## 2019-05-26 NOTE — Progress Notes (Signed)
   SUBJECTIVE:   Hypertension: - Medications: Hydralazine 25 mg twice daily, metoprolol 25 mg daily - Compliance: good - Checking BP at home: no - Denies any SOB, CP, vision changes, LE edema, medication SEs. - Minimal lightheadedness with standing, no falls - Diet: about 1 meal per day. Drinks boost.  - Exercise: not regularly  Healthcare Maintenance - Vaccines: flu, tdap, pneumococcal - has not seen eye doctor in about 50 years.  Hemorrhoid prolapse - s/p banding and hemorrhoidectomy - regular bowel movements, no blood  BPH - Prostatic hypertrophy on exam 01/2019 with trilobar hypertrophy seen on bladder scan 06/2014 with trabeculated bladder. - Endorses strong stream without hematuria.  PERTINENT  PMH: HTN, seizure, partial rectal prolapse, arthritis, BPH, HLD, HSV-2, essential tremor, right upper arm amputation with prosthesis  OBJECTIVE:   BP 140/68   Pulse 100   Ht 5\' 6"  (1.676 m)   Wt 163 lb (73.9 kg)   SpO2 99%   BMI 26.31 kg/m   General: Elderly male, well nourished, well developed, in no acute distress with non-toxic appearance CV: regular rate and rhythm without murmurs, rubs, or gallops, no lower extremity edema Lungs: clear to auscultation bilaterally with normal work of breathing Skin: warm, dry, no rashes or lesions  ASSESSMENT/PLAN:   Hypertension At goal for age with with minimal orthostatic symptoms, no changes made today.  Partial rectal prolapse Resolved and doing well s/p banding and hemorrhoidectomy in 02/2019 with regular bowel movements and no blood in stool.    BPH (benign prostatic hyperplasia) Doing well no medication.  Continue to monitor for now, could consider starting Flomax if symptoms return.    Phillip Hill, [place]

## 2019-05-26 NOTE — Assessment & Plan Note (Signed)
Resolved and doing well s/p banding and hemorrhoidectomy in 02/2019 with regular bowel movements and no blood in stool.

## 2019-05-26 NOTE — Assessment & Plan Note (Signed)
At goal for age with with minimal orthostatic symptoms, no changes made today.

## 2019-05-26 NOTE — Assessment & Plan Note (Signed)
Doing well no medication.  Continue to monitor for now, could consider starting Flomax if symptoms return.

## 2019-05-26 NOTE — Patient Instructions (Signed)
It was great to see you!  Our plans for today:  - No changes to your medications today. - I am referring you to an eye doctor for an eye exam. - Take the prescription for the tetanus vaccine to the pharmacy.  Take care and seek immediate care sooner if you develop any concerns.   Dr. [facility]

## 2019-06-06 ENCOUNTER — Other Ambulatory Visit: Payer: Self-pay | Admitting: Family Medicine

## 2019-06-06 DIAGNOSIS — I10 Essential (primary) hypertension: Secondary | ICD-10-CM

## 2019-06-07 ENCOUNTER — Other Ambulatory Visit: Payer: Self-pay | Admitting: Family Medicine

## 2019-06-21 ENCOUNTER — Other Ambulatory Visit: Payer: Self-pay | Admitting: Family Medicine

## 2019-10-14 ENCOUNTER — Other Ambulatory Visit: Payer: Self-pay | Admitting: Family Medicine

## 2019-10-14 DIAGNOSIS — I10 Essential (primary) hypertension: Secondary | ICD-10-CM

## 2020-04-07 ENCOUNTER — Other Ambulatory Visit: Payer: Self-pay | Admitting: Family Medicine

## 2020-05-07 ENCOUNTER — Other Ambulatory Visit: Payer: Self-pay | Admitting: Family Medicine

## 2020-05-10 ENCOUNTER — Other Ambulatory Visit: Payer: Self-pay | Admitting: Family Medicine

## 2020-05-10 DIAGNOSIS — I10 Essential (primary) hypertension: Secondary | ICD-10-CM

## 2020-11-03 ENCOUNTER — Other Ambulatory Visit: Payer: Self-pay | Admitting: Family Medicine

## 2020-11-10 ENCOUNTER — Ambulatory Visit: Payer: Medicare Other | Admitting: Family Medicine

## 2020-11-10 ENCOUNTER — Other Ambulatory Visit: Payer: Self-pay | Admitting: Family Medicine

## 2020-11-10 DIAGNOSIS — I10 Essential (primary) hypertension: Secondary | ICD-10-CM

## 2020-11-27 ENCOUNTER — Other Ambulatory Visit: Payer: Self-pay | Admitting: Family Medicine

## 2020-12-20 ENCOUNTER — Encounter: Payer: Self-pay | Admitting: Family Medicine

## 2020-12-20 ENCOUNTER — Other Ambulatory Visit: Payer: Self-pay

## 2020-12-20 ENCOUNTER — Ambulatory Visit (INDEPENDENT_AMBULATORY_CARE_PROVIDER_SITE_OTHER): Payer: Medicare Other | Admitting: Family Medicine

## 2020-12-20 VITALS — BP 148/64 | HR 89 | Ht 66.0 in | Wt 160.6 lb

## 2020-12-20 DIAGNOSIS — E559 Vitamin D deficiency, unspecified: Secondary | ICD-10-CM | POA: Diagnosis not present

## 2020-12-20 DIAGNOSIS — E785 Hyperlipidemia, unspecified: Secondary | ICD-10-CM | POA: Diagnosis not present

## 2020-12-20 DIAGNOSIS — I10 Essential (primary) hypertension: Secondary | ICD-10-CM

## 2020-12-20 DIAGNOSIS — G40909 Epilepsy, unspecified, not intractable, without status epilepticus: Secondary | ICD-10-CM | POA: Diagnosis not present

## 2020-12-20 NOTE — Progress Notes (Signed)
   SUBJECTIVE:   CHIEF COMPLAINT / HPI:    Phillip Hill is a 80 y.o. male here for follow up of his chronic health conditions.    HTN Takes hydralazine and metoprolol. Denies missing doses of antihypertensive medications. Denies chest pain, palpitations, lower extremity edema, exertional dyspnea, headaches and vision changes.  Occasionally gets lightheaded with standing.  No falls. Home BP Monitoring: No  Exercises:  not active   Seizure disorder Has not followed up with neurology in quite some time.  States he has not had a seizure in over 20 years.  Takes Dilantin 30 mg at bedtime.  Reports seizures really only occurred at bedtime.     Lives with his son and his brother.  He continues to drive.  Has no complaints or concerns today.  Former smoker. Quit greater than 50 years ago.  Denies alcohol or illicit drug use.    PERTINENT  PMH / PSH: reviewed and updated as appropriate   OBJECTIVE:   BP (!) 148/64   Pulse 89   Ht 5\' 6"  (1.676 m)   Wt 160 lb 9.6 oz (72.8 kg)   SpO2 99%   BMI 25.92 kg/m    GEN: pleasant well appearing male, in no acute distress  CV: regular irregular rhythm RESP: no increased work of breathing, clear to ascultation bilaterally ABD: Bowel sounds present. Soft, non-tender, non-distended.  MSK: right transhumeral amputation, no LE edema  SKIN: warm, dry, no rash on visible skin    ASSESSMENT/PLAN:   Hypertension BP 151/78.  Repeat after 10 to 15 minutes was 148/64.  Patient has some history of orthostasis.  Continue hydralazine 25 mg twice daily and metoprolol XR 25 mg.  Previously on losartan but cardiology changed this to hydralazine metoprolol given history of irregular heart rate.  Patient with a regular irregular rhythm today.  Advised cardiology follow-up but patient declined.  Seizure disorder Stable.  No known seizure in greater than 20 years.  Has not followed with neurology.  Discussed decreasing versus stopping medication with patient.   Does not want to go back to neurology.  States that he does not want to risk having a seizure and not being able to drive.  It is reasonable to continue 300 mg Dilantin at bedtime.  We will check CMP, vitamin D and CBC.  Dyslipidemia Diet controlled.  Repeat lipid panel today.     Lyndee Hensen, DO PGY-3, [facility] 12/21/2020

## 2020-12-20 NOTE — Patient Instructions (Signed)
It was great seeing you today!  Please check-out at the front desk before leaving the clinic. I'd like to see you back in 6 month but if you need to be seen earlier than that for any new issues we're happy to fit you in, just give [place] a call!  Visit Remembers: - Stop by the pharmacy to pick up your prescriptions  - Continue to work on your healthy eating habits and incorporating exercise into your daily life.  - Your goal is to have an BP < 150/90 - Medicine Changes: none - To Do: consider going to speak with a neurologist    Regarding lab work today:  Due to recent changes in healthcare laws, you may see the results of your imaging and laboratory studies on MyChart before your provider has had a chance to review them.  I understand that in some cases there may be results that are confusing or concerning to you. Not all laboratory results come back in the same time frame and you may be waiting for multiple results in order to interpret others.  Please give [place] 72 hours in order for your provider to thoroughly review all the results before contacting the office for clarification of your results. If everything is normal, you will get a letter in the mail or a message in My Chart. Please give [place] a call if you do not hear from [place] after 2 weeks.  Please bring all of your medications with you to each visit.    If you haven't already, sign up for My Chart to have easy access to your labs results, and communication with your primary care physician.  Feel free to call with any questions or concerns at any time, at [phone].   Take care,  Dr. [facility]

## 2020-12-21 ENCOUNTER — Encounter: Payer: Self-pay | Admitting: Family Medicine

## 2020-12-21 ENCOUNTER — Other Ambulatory Visit: Payer: Self-pay | Admitting: Family Medicine

## 2020-12-21 LAB — BASIC METABOLIC PANEL
BUN/Creatinine Ratio: 7 — ABNORMAL LOW (ref 10–24)
BUN: 7 mg/dL — ABNORMAL LOW (ref 8–27)
CO2: 23 mmol/L (ref 20–29)
Calcium: 9.8 mg/dL (ref 8.6–10.2)
Chloride: 101 mmol/L (ref 96–106)
Creatinine, Ser: 1.02 mg/dL (ref 0.76–1.27)
Glucose: 102 mg/dL — ABNORMAL HIGH (ref 70–99)
Potassium: 4.3 mmol/L (ref 3.5–5.2)
Sodium: 139 mmol/L (ref 134–144)
eGFR: 74 mL/min/{1.73_m2} (ref >59)

## 2020-12-21 LAB — LIPID PANEL
Chol/HDL Ratio: 3 ratio (ref 0.0–5.0)
Cholesterol, Total: 164 mg/dL (ref 100–199)
HDL: 54 mg/dL (ref >39)
LDL Chol Calc (NIH): 92 mg/dL (ref 0–99)
Triglycerides: 99 mg/dL (ref 0–149)
VLDL Cholesterol Cal: 18 mg/dL (ref 5–40)

## 2020-12-21 NOTE — Assessment & Plan Note (Signed)
Diet controlled.  Repeat lipid panel today.

## 2020-12-21 NOTE — Assessment & Plan Note (Signed)
Stable.  No known seizure in greater than 20 years.  Has not followed with neurology.  Discussed decreasing versus stopping medication with patient.  Does not want to go back to neurology.  States that he does not want to risk having a seizure and not being able to drive.  It is reasonable to continue 300 mg Dilantin at bedtime.  We will check CMP, vitamin D and CBC.

## 2020-12-21 NOTE — Assessment & Plan Note (Signed)
BP 151/78.  Repeat after 10 to 15 minutes was 148/64.  Patient has some history of orthostasis.  Continue hydralazine 25 mg twice daily and metoprolol XR 25 mg.  Previously on losartan but cardiology changed this to hydralazine metoprolol given history of irregular heart rate.  Patient with a regular irregular rhythm today.  Advised cardiology follow-up but patient declined.

## 2020-12-22 ENCOUNTER — Encounter: Payer: Self-pay | Admitting: Family Medicine

## 2020-12-31 ENCOUNTER — Other Ambulatory Visit: Payer: Self-pay | Admitting: Family Medicine

## 2021-01-02 ENCOUNTER — Other Ambulatory Visit: Payer: Self-pay | Admitting: Family Medicine

## 2021-01-09 ENCOUNTER — Telehealth: Payer: Self-pay

## 2021-01-09 NOTE — Telephone Encounter (Signed)
Patient calls nurse line reporting fatigue for the last several months. Patient reports he feels this is related to his Metoprolol decrease. Patient reports he used to be on 50mg , however was decreased to 25mg  and started noticing a difference in his energy level. Patient reports for the last 2 weeks he has started to double up and take 50mg  and feels "100%" better. Patient reports he has more energy and is less tired through out the day. Patient advised to make an apt to discuss with PCP. We have no available apts at this time. Patient wants to know if he can continue 50mg  safely in the meantime.

## 2021-01-10 ENCOUNTER — Emergency Department (HOSPITAL_COMMUNITY)
Admission: EM | Admit: 2021-01-10 | Discharge: 2021-01-10 | Disposition: A | Discharge status: Home / Self Care | Payer: Medicare Other | Attending: Emergency Medicine | Admitting: Emergency Medicine

## 2021-01-10 ENCOUNTER — Encounter (HOSPITAL_COMMUNITY): Payer: Self-pay | Admitting: Emergency Medicine

## 2021-01-10 DIAGNOSIS — I10 Essential (primary) hypertension: Secondary | ICD-10-CM | POA: Insufficient documentation

## 2021-01-10 DIAGNOSIS — R002 Palpitations: Secondary | ICD-10-CM | POA: Diagnosis not present

## 2021-01-10 DIAGNOSIS — Z87891 Personal history of nicotine dependence: Secondary | ICD-10-CM | POA: Diagnosis not present

## 2021-01-10 DIAGNOSIS — R110 Nausea: Secondary | ICD-10-CM | POA: Insufficient documentation

## 2021-01-10 DIAGNOSIS — Z79899 Other long term (current) drug therapy: Secondary | ICD-10-CM | POA: Diagnosis not present

## 2021-01-10 LAB — CBC WITH DIFFERENTIAL/PLATELET
Abs Immature Granulocytes: 0.02 10*3/uL (ref 0.00–0.07)
Basophils Absolute: 0.1 10*3/uL (ref 0.0–0.1)
Basophils Relative: 1 %
Eosinophils Absolute: 0.2 10*3/uL (ref 0.0–0.5)
Eosinophils Relative: 3 %
HCT: 39.1 % (ref 39.0–52.0)
Hemoglobin: 12.8 g/dL — ABNORMAL LOW (ref 13.0–17.0)
Immature Granulocytes: 0 %
Lymphocytes Relative: 48 %
Lymphs Abs: 3 10*3/uL (ref 0.7–4.0)
MCH: 31.1 pg (ref 26.0–34.0)
MCHC: 32.7 g/dL (ref 30.0–36.0)
MCV: 95.1 fL (ref 80.0–100.0)
Monocytes Absolute: 0.7 10*3/uL (ref 0.1–1.0)
Monocytes Relative: 11 %
Neutro Abs: 2.4 10*3/uL (ref 1.7–7.7)
Neutrophils Relative %: 37 %
Platelets: 242 10*3/uL (ref 150–400)
RBC: 4.11 MIL/uL — ABNORMAL LOW (ref 4.22–5.81)
RDW: 13.7 % (ref 11.5–15.5)
WBC: 6.4 10*3/uL (ref 4.0–10.5)
nRBC: 0 % (ref 0.0–0.2)

## 2021-01-10 LAB — COMPREHENSIVE METABOLIC PANEL
ALT: 17 U/L (ref 0–44)
AST: 19 U/L (ref 15–41)
Albumin: 3.4 g/dL — ABNORMAL LOW (ref 3.5–5.0)
Alkaline Phosphatase: 123 U/L (ref 38–126)
Anion gap: 7 (ref 5–15)
BUN: 6 mg/dL — ABNORMAL LOW (ref 8–23)
CO2: 27 mmol/L (ref 22–32)
Calcium: 9.1 mg/dL (ref 8.9–10.3)
Chloride: 104 mmol/L (ref 98–111)
Creatinine, Ser: 1 mg/dL (ref 0.61–1.24)
GFR, Estimated: >60 mL/min (ref >60)
Glucose, Bld: 131 mg/dL — ABNORMAL HIGH (ref 70–99)
Potassium: 3.6 mmol/L (ref 3.5–5.1)
Sodium: 138 mmol/L (ref 135–145)
Total Bilirubin: 0.3 mg/dL (ref 0.3–1.2)
Total Protein: 7 g/dL (ref 6.5–8.1)

## 2021-01-10 LAB — TROPONIN I (HIGH SENSITIVITY)
Troponin I (High Sensitivity): 20 ng/L — ABNORMAL HIGH (ref <18)
Troponin I (High Sensitivity): 19 ng/L — ABNORMAL HIGH (ref <18)

## 2021-01-10 LAB — TSH: TSH: 0.445 u[IU]/mL (ref 0.350–4.500)

## 2021-01-10 LAB — MAGNESIUM: Magnesium: 2 mg/dL (ref 1.7–2.4)

## 2021-01-10 MED ORDER — METOPROLOL SUCCINATE ER 25 MG PO TB24: 50.0000 mg | ORAL_TABLET | Freq: Every day | ORAL | 0 refills | Status: DC

## 2021-01-10 NOTE — ED Provider Notes (Signed)
Emergency Medicine Provider Triage Evaluation Note  Phillip Hill , a 80 y.o. male  was evaluated in triage.  Pt complains of palpitations.  Reports symptoms have been present intermittently over the last 2 to 3 years.  Palpitations have been more frequent recently.  Patient endorses associated lightheadedness with palpitations at times.  Endorses feeling palpitations at present.   Review of Systems  Positive: Palpitations, lightheadedness Negative: Chest pain, shortness of breath, nausea, vomiting, diaphoresis, abdominal pain, leg swelling or tenderness, syncope  Physical Exam  There were no vitals taken for this visit. Gen:   Awake, no distress   Resp:  Normal effort  MSK:   Moves extremities without difficulty  Other:  Abdomen soft, nondistended, nontender.  +2 left radial pulse.  Medical Decision Making  Medically screening exam initiated at 1:28 PM.  Appropriate orders placed.  Phillip Hill was informed that the remainder of the evaluation will be completed by another provider, this initial triage assessment does not replace that evaluation, and the importance of remaining in the ED until their evaluation is complete.  Palpitations; per chart review patient seen for similar in [address]   Dyann Ruddle 01/10/21 1330    Blanchie Dessert, MD 01/17/21 [phone]

## 2021-01-10 NOTE — ED Provider Notes (Addendum)
[facility] EMERGENCY DEPARTMENT Provider Note   CSN: [id number] Arrival date & time: 01/10/21  1317     History Chief Complaint  Patient presents with   Hypertension    Phillip Hill is a 80 y.o. male.   Hypertension Pertinent negatives include no chest pain, no abdominal pain, no headaches and no shortness of breath.   80 year old male with a history of hypertension, PACs, seizure disorder presenting to the emergency department with a complaint of palpitations.  The patient denies any chest pain.  He states that he has had intermittent palpitations over the past few days.  No other complaints.  Palpitations are intermittent and mild.  He states that he has a history of elevated blood pressure.  He had previously been on medication for his palpitations which was reduced. On chart review, the patient is currently on metoprolol 25 mg 24-hour tablets every day.  He states that he thinks his dosage was recent cut back form 50mg  to 25mg , however, I see prescriptions for 25mg  dating back to 2020. He since feels that he has been having palpitations.  He states that he told his PCP he was concerned about sexual dysfunction associated with his beta blocking medication which is why his medication was cut back.  He currently denies any chest pain, shortness of breath, headaches, vision changes. He has been taking his home antihypertensives. Does endorse mild nausea.  Past Medical History:  Diagnosis Date   Arthritis    Chronic cystitis    Complex partial seizure disorder ([place])    began 23's--  last seizure 1995  per neurologist note 10/ 2015--  dr Krista Blue   Diverticulosis of colon    Frequency of urination    Hemorrhoids    History of herpes simplex type 2 infection    History of lower GI bleeding    secondary to ASA use--  resolved   Hypertension    Lazy eye of left side    Nocturia    PAC (premature atrial contraction)    Seizures ([facility])    Urgency of urination      Patient Active Problem List   Diagnosis Date Noted   BPH (benign prostatic hyperplasia) 02/08/2019   Partial rectal prolapse 02/08/2019   Abdominal pain 12/19/2017   Acute cystitis with hematuria 12/19/2017   Localization-related idiopathic epilepsy and epileptic syndromes with seizures of localized onset, not intractable, without status epilepticus ([place]) 12/11/2016   Health care maintenance 09/07/2015   HSV-2 (herpes simplex virus 2) infection 06/09/2015   Arthritis    Sleep disorder    Hypertension    Right upper arm amputation with prosthesis     Essential and other specified forms of tremor    Diverticula of colon 10/24/2011   Seizure disorder ([place]) 10/21/2011   Dyslipidemia 10/21/2011    Past Surgical History:  Procedure Laterality Date   ARM AMPUTATION Right 1960   infection due to injury   CHOLECYSTECTOMY N/A 10/22/2015   Procedure: LAPAROSCOPIC CHOLECYSTECTOMY WITH INTRAOPERATIVE CHOLANGIOGRAM;  Surgeon: Stark Klein, MD;  Location: [facility];  Service: General;  Laterality: N/A;   COLONOSCOPY  10/24/2011   Procedure: COLONOSCOPY;  Surgeon: Milus Banister, MD;  Location: [facility];  Service: Endoscopy;  Laterality: N/A;   CYSTOSCOPY WITH BIOPSY N/A 06/28/2014   Procedure: CYSTOSCOPY WITH BIOPSY;  Surgeon: Lowella Bandy, MD;  Location: [facility];  Service: Urology;  Laterality: N/A;   INGUINAL HERNIA REPAIR Left 09-15-2009   TRANSTHORACIC ECHOCARDIOGRAM  10-28-2013  dr Tressia Miners turner   mild focal basal hypertrophy of septum/  ef 57-01%/  grade I diastolic dysfunction/  mild AR/  trivial MR and TR       Family History  Problem Relation Age of Onset   Cancer Mother    Cancer Sister    Cancer Brother     Social History   Tobacco Use   Smoking status: Former    Types: Cigarettes    Quit date: 10/23/1968    Years since quitting: 52.2   Smokeless tobacco: Never  Substance Use Topics   Alcohol use: No    Comment: Quit alcohol in 1970   Drug use: No     Home Medications Prior to Admission medications   Medication Sig Start Date End Date Taking? Authorizing Provider  hydrALAZINE (APRESOLINE) 25 MG tablet TAKE 1 TABLET BY MOUTH TWICE A DAY 11/13/20   Brimage, Vondra, DO  metoprolol succinate (TOPROL-XL) 25 MG 24 hr tablet Take 2 tablets (50 mg total) by mouth daily. 01/10/21   Regan Lemming, MD  phenytoin (DILANTIN) 100 MG ER capsule TAKE 3 CAPSULES BY MOUTH AT BEDTIME. 01/02/21   Brimage, Ronnette Juniper, DO  valACYclovir (VALTREX) 500 MG tablet TAKE 1 TABLET BY MOUTH EVERY DAY 11/03/20   Lyndee Hensen, DO    Allergies    Ciprofloxacin and Penicillins  Review of Systems   Review of Systems  Constitutional:  Negative for chills and fever.  HENT:  Negative for ear pain and sore throat.   Eyes:  Negative for pain and visual disturbance.  Respiratory:  Negative for cough and shortness of breath.   Cardiovascular:  Positive for palpitations. Negative for chest pain.  Gastrointestinal:  Positive for nausea. Negative for abdominal pain and vomiting.  Genitourinary:  Negative for dysuria and hematuria.  Musculoskeletal:  Negative for arthralgias and back pain.  Skin:  Negative for color change and rash.  Neurological:  Negative for dizziness, seizures, syncope, facial asymmetry, speech difficulty, weakness, light-headedness, numbness and headaches.  All other systems reviewed and are negative.  Physical Exam Updated Vital Signs BP (!) 163/70 (BP Location: Left Arm)   Pulse 75   Temp 98.1 F (36.7 C) (Oral)   Resp 16   SpO2 99%   Physical Exam Vitals and nursing note reviewed.  Constitutional:      General: He is not in acute distress.    Appearance: He is well-developed.  HENT:     Head: Normocephalic and atraumatic.  Eyes:     Conjunctiva/sclera: Conjunctivae normal.     Pupils: Pupils are equal, round, and reactive to light.  Cardiovascular:     Rate and Rhythm: Normal rate and regular rhythm.     Pulses: Normal pulses.   Pulmonary:     Effort: Pulmonary effort is normal. No respiratory distress.     Breath sounds: Normal breath sounds.  Abdominal:     General: There is no distension.     Palpations: Abdomen is soft.     Tenderness: There is no abdominal tenderness. There is no guarding.  Musculoskeletal:        General: No deformity or signs of injury.     Cervical back: Normal range of motion and neck supple.     Comments: RUE prosthetic limb in place  Skin:    General: Skin is warm and dry.     Findings: No lesion or rash.  Neurological:     General: No focal deficit present.     Mental Status: He  is alert. Mental status is at baseline.     Cranial Nerves: No cranial nerve deficit.     Sensory: No sensory deficit.     Motor: No weakness.     Coordination: Coordination normal.    ED Results / Procedures / Treatments   Labs (all labs ordered are listed, but only abnormal results are displayed) Labs Reviewed  COMPREHENSIVE METABOLIC PANEL - Abnormal; Notable for the following components:      Result Value   Glucose, Bld 131 (*)    BUN 6 (*)    Albumin 3.4 (*)    All other components within normal limits  CBC WITH DIFFERENTIAL/PLATELET - Abnormal; Notable for the following components:   RBC 4.11 (*)    Hemoglobin 12.8 (*)    All other components within normal limits  TROPONIN I (HIGH SENSITIVITY) - Abnormal; Notable for the following components:   Troponin I (High Sensitivity) 20 (*)    All other components within normal limits  TROPONIN I (HIGH SENSITIVITY) - Abnormal; Notable for the following components:   Troponin I (High Sensitivity) 19 (*)    All other components within normal limits  MAGNESIUM  TSH    EKG EKG Interpretation  Date/Time:  Wednesday January 10 2021 13:27:05 EDT Ventricular Rate:  76 PR Interval:  214 QRS Duration: 72 QT Interval:  312 QTC Calculation: 351 R Axis:   24 Text Interpretation: Sinus rhythm with 1st degree A-V block Nonspecific T wave  abnormality  No sig change from prior tracing Jan 2018 Confirmed by Octaviano Glow [phone]) on 01/10/2021 2:26:09 PM  Radiology No results found.  Procedures Procedures   Medications Ordered in ED Medications - No data to display  ED Course  I have reviewed the triage vital signs and the nursing notes.  Pertinent labs & imaging results that were available during my care of the patient were reviewed by me and considered in my medical decision making (see chart for details).    MDM Rules/Calculators/A&P                           80 year old male with a history of hypertension, PACs, seizure disorder presenting to the emergency department with a complaint of palpitations.  The patient denies any chest pain.  He states that he has had intermittent palpitations over the past few days.  No other complaints.  Palpitations are intermittent and mild.  He states that he has a history of elevated blood pressure.  He had previously been on medication for his palpitations which was reduced. On chart review, the patient is currently on metoprolol 25 mg 24-hour tablets every day.  He states that he thinks his dosage was recent cut back form 50mg  to 25mg , however, I see prescriptions for 25mg  dating back to 2020. He since feels that he has been having palpitations.  He states that he told his PCP he was concerned about sexual dysfunction associated with his beta blocking medication which is why his medication was cut back.  He currently denies any chest pain, shortness of breath, headaches, vision changes. He has been taking his home antihypertensives. Does endorse mild nausea.  On arrival, the patient was afebrile, mildly hypertensive BP 160/67, pulse 72, saturating 91% on room air.  Physical exam significant for a well-appearing man in no apparent distress, lungs clear to auscultation bilaterally, distal pulses 2+ and regular.  Patient is presenting with palpitations. He has a history of PACs.  Denies  chest  pain or shortness of breath.  Does endorse mild nausea.  Also presenting with asymptomatic hypertension.  Patient was found to be mildly hypertensive BP 161/68.  He has a reassuring neurologic exam with no clear evidence of hypertensive emergency.  Initial troponin was found to be elevated at 20, downtrending to 19.  Remainder of respiratory work-up otherwise on remarkable, mild anemia to 12.8 noted from a baseline of 13-14..  Low suspicion for ACS or PE at this time as the patient is currently asymptomatic.  After discussion with the patient, will plan to increase his home metoprolol dose to 50 mg p.o. from 25, outpatient follow-up urgently this week for blood pressure recheck in clinic for further discussion of his antihypertensive regimen outpatient.   Final Clinical Impression(s) / ED Diagnoses Final diagnoses:  Palpitations  Hypertension, unspecified type    Rx / DC Orders ED Discharge Orders          Ordered    metoprolol succinate (TOPROL-XL) 25 MG 24 hr tablet  Daily        01/10/21 1801             Regan Lemming, MD 01/10/21 Kennedy Bucker    Regan Lemming, MD 01/10/21 2354

## 2021-01-10 NOTE — ED Triage Notes (Signed)
Patient here for evaluation of hypertension and an irregular heart beat. Patient denies history of arrhythmia, states "I can feel my heart and its not beating regularly". Patient denies pain.

## 2021-01-10 NOTE — ED Notes (Signed)
Pt resting in stretcher, denies needs, awaiting lab results.

## 2021-01-10 NOTE — Discharge Instructions (Addendum)
Please follow-up with your family medicine physician to discuss your outpatient blood pressure control. We have increased your Metoprolol dose to 50mg  daily. Recommend you follow-up with week with your PCP for a BP recheck.

## 2021-01-11 NOTE — Telephone Encounter (Signed)
Patient was seen in ED yesterday for palpitations. Patient was given Metoprolol 50mg  by provider. Patient scheduled with PCP in November to discuss.

## 2021-02-06 ENCOUNTER — Other Ambulatory Visit: Payer: Self-pay

## 2021-02-06 ENCOUNTER — Encounter: Payer: Self-pay | Admitting: Family Medicine

## 2021-02-06 ENCOUNTER — Ambulatory Visit (INDEPENDENT_AMBULATORY_CARE_PROVIDER_SITE_OTHER): Payer: Medicare Other | Admitting: Family Medicine

## 2021-02-06 DIAGNOSIS — R002 Palpitations: Secondary | ICD-10-CM | POA: Diagnosis not present

## 2021-02-06 MED ORDER — METOPROLOL SUCCINATE ER 50 MG PO TB24: 50.0000 mg | ORAL_TABLET | Freq: Every day | ORAL | 0 refills | Status: DC

## 2021-02-06 NOTE — Patient Instructions (Signed)
It was great seeing you today!  Visit Remembers: - Stop by the pharmacy to pick up your prescriptions  - Continue to work on your healthy eating habits and incorporating exercise into your daily life.  - Your goal is to have an BP < 140/90 - Continue taking the increased dose of metoprolol. If you start having dizziness, headaches, blurry vision    Please bring all of your medications with you to each visit.    Feel free to call with any questions or concerns at any time, at [phone].   Take care,  Dr. [facility]ie [facility]

## 2021-02-06 NOTE — Progress Notes (Signed)
   SUBJECTIVE:   CHIEF COMPLAINT / HPI:   Chief Complaint  Patient presents with   Medication Management   Irregular Heart Beat     Phillip Hill is a 80 y.o. male here to discuss palpitations.  Patient having intermittent palpitations.  Denies chest pain, shortness of breath.  States that he feels like his heart rate is irregular.  He has been taking 50 mg of metoprolol.  Believes this was decreased at his last visit however the dose did not change.  He went to the ED and they prescribed 50 mg long-acting metoprolol at that with what the patient had been taking at home.  He feels like this dose is better for him.  He denies dizziness, lightheadedness or headaches.  There have been no falls.  He would like to continue this long-acting increased dose of metoprolol.   PERTINENT  PMH / PSH: reviewed and updated as appropriate   OBJECTIVE:   BP (!) 145/83   Pulse 84   Ht 5\' 6"  (1.676 m)   Wt 162 lb 9.6 oz (73.8 kg)   SpO2 99%   BMI 26.24 kg/m    GEN: pleasant well appearing male, in no acute distress  CV: regular irregular rhythm RESP: no increased work of breathing, clear to ascultation bilaterally MSK: right transhumeral amputation, wearing prosthetic RUE, no LE edema  SKIN: warm, dry, no rash on visible skin   ASSESSMENT/PLAN:   Palpitations BP and HR normal. Refill metoprolol at increased dose of 50 mg XL.  Recent EKG from ED reviewed from 10/222.  APCs seen.    Advised patient to follow-up with cardiology if palpitations persist however he prefers to only be seen at the [facility] clinic at this time.    Discussed advance care planning and provide advanced care packet at next visit.    Lyndee Hensen, DO PGY-3, [facility] 02/06/2021

## 2021-02-07 ENCOUNTER — Telehealth: Payer: Self-pay

## 2021-02-07 MED ORDER — METOPROLOL SUCCINATE ER 50 MG PO TB24: 50.0000 mg | ORAL_TABLET | Freq: Every day | ORAL | 0 refills | Status: DC

## 2021-02-07 NOTE — Telephone Encounter (Signed)
Patients daughter calls nurse line stating the pharmacy never received Metoprolol prescription.   Prescription was set to print at [place] office visit.   Will resend.

## 2021-02-09 NOTE — Assessment & Plan Note (Addendum)
BP and HR normal. Refill metoprolol at increased dose of 50 mg XL.  Recent EKG from ED reviewed from 10/222.  APCs seen.    Advised patient to follow-up with cardiology if palpitations persist however he prefers to only be seen at the [facility] clinic at this time.

## 2021-02-12 ENCOUNTER — Telehealth: Payer: Self-pay

## 2021-02-12 NOTE — Telephone Encounter (Signed)
Patients daughter calls nurse line stating her father wants to move forward with Viagra prescription.   Daughter reports this was discussed during previous office visits.   Will forward to PCP.

## 2021-02-13 ENCOUNTER — Other Ambulatory Visit: Payer: Self-pay | Admitting: Family Medicine

## 2021-02-17 MED ORDER — SILDENAFIL CITRATE 100 MG PO TABS: 50.0000 mg | ORAL_TABLET | Freq: Every day | ORAL | 11 refills | Status: DC | PRN

## 2021-02-19 MED ORDER — SILDENAFIL CITRATE 100 MG PO TABS: 50.0000 mg | ORAL_TABLET | Freq: Every day | ORAL | 11 refills | Status: DC | PRN

## 2021-02-19 NOTE — Telephone Encounter (Signed)
Daughter needs Sildenafil sent to [facility] for cost purposes.   I have cancelled prescription at [facility].   Prescription resent to [facility].

## 2021-03-28 ENCOUNTER — Other Ambulatory Visit: Payer: Self-pay

## 2021-03-28 ENCOUNTER — Encounter (HOSPITAL_COMMUNITY): Payer: Self-pay | Admitting: Emergency Medicine

## 2021-03-28 ENCOUNTER — Emergency Department (HOSPITAL_COMMUNITY): Payer: Medicare Other

## 2021-03-28 ENCOUNTER — Emergency Department (HOSPITAL_COMMUNITY)
Admission: EM | Admit: 2021-03-28 | Discharge: 2021-03-28 | Disposition: A | Discharge status: Home / Self Care | Payer: Medicare Other | Attending: Emergency Medicine | Admitting: Emergency Medicine

## 2021-03-28 ENCOUNTER — Telehealth: Payer: Self-pay

## 2021-03-28 DIAGNOSIS — R002 Palpitations: Secondary | ICD-10-CM | POA: Diagnosis not present

## 2021-03-28 DIAGNOSIS — Z743 Need for continuous supervision: Secondary | ICD-10-CM | POA: Diagnosis not present

## 2021-03-28 DIAGNOSIS — Z79899 Other long term (current) drug therapy: Secondary | ICD-10-CM | POA: Diagnosis not present

## 2021-03-28 DIAGNOSIS — R110 Nausea: Secondary | ICD-10-CM | POA: Diagnosis not present

## 2021-03-28 DIAGNOSIS — I10 Essential (primary) hypertension: Secondary | ICD-10-CM | POA: Diagnosis not present

## 2021-03-28 DIAGNOSIS — R9431 Abnormal electrocardiogram [ECG] [EKG]: Secondary | ICD-10-CM | POA: Diagnosis not present

## 2021-03-28 LAB — CBC WITH DIFFERENTIAL/PLATELET
Abs Immature Granulocytes: 0.02 10*3/uL (ref 0.00–0.07)
Basophils Absolute: 0 10*3/uL (ref 0.0–0.1)
Basophils Relative: 1 %
Eosinophils Absolute: 0.1 10*3/uL (ref 0.0–0.5)
Eosinophils Relative: 2 %
HCT: 38.8 % — ABNORMAL LOW (ref 39.0–52.0)
Hemoglobin: 13.2 g/dL (ref 13.0–17.0)
Immature Granulocytes: 0 %
Lymphocytes Relative: 40 %
Lymphs Abs: 2.2 10*3/uL (ref 0.7–4.0)
MCH: 31.9 pg (ref 26.0–34.0)
MCHC: 34 g/dL (ref 30.0–36.0)
MCV: 93.7 fL (ref 80.0–100.0)
Monocytes Absolute: 0.6 10*3/uL (ref 0.1–1.0)
Monocytes Relative: 10 %
Neutro Abs: 2.6 10*3/uL (ref 1.7–7.7)
Neutrophils Relative %: 47 %
Platelets: 214 10*3/uL (ref 150–400)
RBC: 4.14 MIL/uL — ABNORMAL LOW (ref 4.22–5.81)
RDW: 13.4 % (ref 11.5–15.5)
WBC: 5.4 10*3/uL (ref 4.0–10.5)
nRBC: 0 % (ref 0.0–0.2)

## 2021-03-28 LAB — COMPREHENSIVE METABOLIC PANEL
ALT: 14 U/L (ref 0–44)
AST: 19 U/L (ref 15–41)
Albumin: 3.3 g/dL — ABNORMAL LOW (ref 3.5–5.0)
Alkaline Phosphatase: 107 U/L (ref 38–126)
Anion gap: 7 (ref 5–15)
BUN: 9 mg/dL (ref 8–23)
CO2: 23 mmol/L (ref 22–32)
Calcium: 9.3 mg/dL (ref 8.9–10.3)
Chloride: 105 mmol/L (ref 98–111)
Creatinine, Ser: 0.92 mg/dL (ref 0.61–1.24)
GFR, Estimated: >60 mL/min (ref >60)
Glucose, Bld: 121 mg/dL — ABNORMAL HIGH (ref 70–99)
Potassium: 4 mmol/L (ref 3.5–5.1)
Sodium: 135 mmol/L (ref 135–145)
Total Bilirubin: 0.6 mg/dL (ref 0.3–1.2)
Total Protein: 7.4 g/dL (ref 6.5–8.1)

## 2021-03-28 LAB — TSH: TSH: 0.341 u[IU]/mL — ABNORMAL LOW (ref 0.350–4.500)

## 2021-03-28 LAB — TROPONIN I (HIGH SENSITIVITY): Troponin I (High Sensitivity): 5 ng/L (ref <18)

## 2021-03-28 MED ORDER — ONDANSETRON HCL 4 MG PO TABS: 4.0000 mg | ORAL_TABLET | Freq: Four times a day (QID) | ORAL | 0 refills | Status: DC

## 2021-03-28 NOTE — Telephone Encounter (Signed)
Patient's daughter calls nurse line regarding current ED visit. Patient was brought in via ambulance for evaluation of palpitations and nausea. Daughter reports being in the ED since around 0600 and have not seen a provider.   Daughter is asking if they can take patient home and schedule close follow up with our office. Advised daughter that I would recommend staying in ED to have evaluation by provider. Daughter verbalizes understanding.   Talbot Grumbling, RN

## 2021-03-28 NOTE — ED Notes (Signed)
Patient denies pain and is sitting comfortably.

## 2021-03-28 NOTE — ED Provider Notes (Signed)
Emergency Medicine Provider Triage Evaluation Note  Phillip Hill , a 81 y.o. male  was evaluated in triage.  Pt complains of palpitations and nausea.  This has been ongoing however he notes recently as more frequent.  He reports compliance with all of his medications, denies any dose changes.  He was very nauseated with this episode, received 4 mg of Zofran by EMS with resolution of his nausea.  He reports discomfort in his chest.  Review of Systems  Positive: See above Negative:   Physical Exam  BP 119/65 (BP Location: Left Arm)    Pulse 86    Temp 98.7 F (37.1 C) (Oral)    Resp 18    Ht 5\' 6"  (1.676 m)    Wt 80 kg    SpO2 100%    BMI 28.47 kg/m  Gen:   Awake, no distress   Resp:  Normal effort  MSK:   Moves extremities without difficulty  Other:   RRR, no distress  Medical Decision Making  Medically screening exam initiated at 6:07 AM.  Appropriate orders placed.  Fitzroy Mikami was informed that the remainder of the evaluation will be completed by another provider, this initial triage assessment does not replace that evaluation, and the importance of remaining in the ED until their evaluation is complete.  Note: Portions of this report may have been transcribed using voice recognition software. Every effort was made to ensure accuracy; however, inadvertent computerized transcription errors may be present    Ollen Gross 03/28/21 [id number]    Fatima Blank, MD 03/30/21 [phone]

## 2021-03-28 NOTE — ED Triage Notes (Signed)
Patient reports intermittent palpitations and nausea onset last week , no emesis or chest pain , he received Zofran 4 mg IV by EMS with relief.

## 2021-03-28 NOTE — ED Notes (Signed)
Discharge instructions reviewed with patient and daughter. Patient and daughter verbalized understanding of instructions. Follow-up care and medications were reviewed. Patient ambulatory with steady gait. VSS upon discharge.

## 2021-03-28 NOTE — ED Provider Notes (Signed)
[facility] EMERGENCY DEPARTMENT Provider Note   CSN: [id number] Arrival date & time: 03/28/21  0555     History  Chief Complaint  Patient presents with   Palpitations    Phillip Hill is a 81 y.o. male.  The history is provided by the patient.  Palpitations Palpitations quality:  Fast Onset quality:  Gradual Duration:  1 week Timing:  Intermittent Progression:  Waxing and waning Chronicity:  New Context: caffeine   Relieved by:  Nothing Worsened by:  Nothing Associated symptoms: nausea   Associated symptoms: no back pain, no chest pain, no chest pressure, no cough, no diaphoresis, no dizziness, no hemoptysis, no leg pain, no lower extremity edema, no orthopnea, no PND, no shortness of breath, no syncope and no vomiting   Risk factors: no hx of atrial fibrillation       Home Medications Prior to Admission medications   Medication Sig Start Date End Date Taking? Authorizing Provider  hydrALAZINE (APRESOLINE) 25 MG tablet TAKE 1 TABLET BY MOUTH TWICE A DAY 11/13/20   Brimage, Vondra, DO  metoprolol succinate (TOPROL-XL) 50 MG 24 hr tablet Take 1 tablet (50 mg total) by mouth daily. 02/07/21   Lyndee Hensen, DO  phenytoin (DILANTIN) 100 MG ER capsule TAKE 3 CAPSULES BY MOUTH AT BEDTIME. 01/02/21   Brimage, Vondra, DO  sildenafil (VIAGRA) 100 MG tablet Take 0.5-1 tablets (50-100 mg total) by mouth daily as needed for erectile dysfunction. 02/19/21   Lyndee Hensen, DO  valACYclovir (VALTREX) 500 MG tablet TAKE 1 TABLET BY MOUTH EVERY DAY 11/03/20   Lyndee Hensen, DO      Allergies    Ciprofloxacin and Penicillins    Review of Systems   Review of Systems  Constitutional:  Negative for chills, diaphoresis and fever.  HENT:  Negative for ear pain and sore throat.   Eyes:  Negative for pain and visual disturbance.  Respiratory:  Negative for cough, hemoptysis and shortness of breath.   Cardiovascular:  Positive for palpitations. Negative for chest pain,  orthopnea, syncope and PND.  Gastrointestinal:  Positive for nausea. Negative for abdominal pain and vomiting.  Genitourinary:  Negative for dysuria and hematuria.  Musculoskeletal:  Negative for arthralgias and back pain.  Skin:  Negative for color change and rash.  Neurological:  Negative for dizziness, seizures and syncope.  All other systems reviewed and are negative.  Physical Exam Updated Vital Signs BP (!) 143/69    Pulse 75    Temp 98.1 F (36.7 C) (Oral)    Resp 18    Ht 5\' 6"  (1.676 m)    Wt 80 kg    SpO2 97%    BMI 28.47 kg/m  Physical Exam Vitals and nursing note reviewed.  Constitutional:      General: He is not in acute distress.    Appearance: He is well-developed. He is not ill-appearing.  HENT:     Head: Normocephalic and atraumatic.     Nose: Nose normal.     Mouth/Throat:     Mouth: Mucous membranes are moist.  Eyes:     Extraocular Movements: Extraocular movements intact.     Conjunctiva/sclera: Conjunctivae normal.     Pupils: Pupils are equal, round, and reactive to light.  Cardiovascular:     Rate and Rhythm: Normal rate and regular rhythm.     Pulses: Normal pulses.     Heart sounds: Normal heart sounds. No murmur heard. Pulmonary:     Effort: Pulmonary effort is normal. No  respiratory distress.     Breath sounds: Normal breath sounds.  Abdominal:     Palpations: Abdomen is soft.     Tenderness: There is no abdominal tenderness.  Musculoskeletal:        General: No swelling.     Cervical back: Normal range of motion and neck supple.  Skin:    General: Skin is warm and dry.     Capillary Refill: Capillary refill takes less than 2 seconds.  Neurological:     General: No focal deficit present.     Mental Status: He is alert and oriented to person, place, and time.     Sensory: No sensory deficit.     Motor: No weakness.  Psychiatric:        Mood and Affect: Mood normal.    ED Results / Procedures / Treatments   Labs (all labs ordered are  listed, but only abnormal results are displayed) Labs Reviewed  CBC WITH DIFFERENTIAL/PLATELET - Abnormal; Notable for the following components:      Result Value   RBC 4.14 (*)    HCT 38.8 (*)    All other components within normal limits  COMPREHENSIVE METABOLIC PANEL - Abnormal; Notable for the following components:   Glucose, Bld 121 (*)    Albumin 3.3 (*)    All other components within normal limits  TSH - Abnormal; Notable for the following components:   TSH 0.341 (*)    All other components within normal limits  TROPONIN I (HIGH SENSITIVITY)    EKG EKG Interpretation  Date/Time:  Wednesday March 28 2021 05:53:41 EST Ventricular Rate:  92 PR Interval:  208 QRS Duration: 68 QT Interval:  340 QTC Calculation: 420 R Axis:   15 Text Interpretation: Sinus rhythm with Premature atrial complexes with Abberant conduction Nonspecific T wave abnormality Abnormal ECG When compared with ECG of 10-Jan-2021 13:27, PREVIOUS ECG IS PRESENT Confirmed by Lennice Sites ([id number]) on 03/28/2021 3:55:47 PM  Radiology DG Chest 2 View  Result Date: 03/28/2021 CLINICAL DATA:  81 year old male with palpitations and nausea. EXAM: CHEST - 2 VIEW COMPARISON:  Chest radiographs 04/19/2016 and earlier. FINDINGS: Lung volumes and mediastinal contours are stable since 2011. There is chronic mild tracheal deviation to the left at the thoracic inlet raising the possibility of chronic thoracic goiter along with the increased right paratracheal density there. Calcified aortic atherosclerosis. No pneumothorax, pulmonary edema, pleural effusion or confluent pulmonary opacity. Mild scoliosis. No acute osseous abnormality identified. Cholecystectomy clips. Negative visible bowel gas. IMPRESSION: 1.  No acute cardiopulmonary abnormality. 2. Chronic increased right paratracheal density and mild tracheal deviation to the left suggesting a chronic right thyroid goiter. Electronically Signed   By: Genevie Ann M.D.   On: 03/28/2021  07:14    Procedures Procedures    Medications Ordered in ED Medications - No data to display  ED Course/ Medical Decision Making/ A&P                           Medical Decision Making  Phillip Hill is here with palpitations.  Unremarkable vitals.  Normal heart rate.  EKG ordered and reviewed/interpreted by myself shows sinus rhythm with PACs.  Upon chart review patient has history of the same.  He is on metoprolol.  Otherwise no significant medical history.  He is not having any chest pain or shortness of breath.  Have no concern for acute coronary syndrome or pulmonary embolism.  Lab work has been  ordered to evaluate for dehydration, electrolyte abnormality.  Chest x-ray has been ordered to evaluate for infectious process.  Overall chest x-ray interpreted by myself shows no acute abnormalities.  Lab work reviewed and interpreted by myself shows no significant anemia, electrolyte abnormality, kidney injury.  Troponin is normal.  Overall suspect symptomatic PACs.  He does drink caffeine all throughout the day he states.  Recommend that he cut back on caffeine.  We will have him follow-up with cardiology to discuss wearing an event monitor.  Family was at the bedside and overall they are aware of this plan as well.  Patient otherwise appears well and discharged in ED good condition.  He is not having any symptoms.  He does get nauseous at times with the palpitations and will prescribe Zofran.  This chart was dictated using voice recognition software.  Despite best efforts to proofread,  errors can occur which can change the documentation meaning.         Final Clinical Impression(s) / ED Diagnoses Final diagnoses:  None    Rx / DC Orders ED Discharge Orders     None         Lennice Sites, DO 03/28/21 1645

## 2021-04-06 ENCOUNTER — Other Ambulatory Visit: Payer: Self-pay | Admitting: Family Medicine

## 2021-04-06 DIAGNOSIS — I10 Essential (primary) hypertension: Secondary | ICD-10-CM

## 2021-04-16 ENCOUNTER — Ambulatory Visit: Payer: Medicare Other | Admitting: Cardiology

## 2021-08-06 ENCOUNTER — Other Ambulatory Visit: Payer: Self-pay

## 2021-08-06 MED ORDER — METOPROLOL SUCCINATE ER 50 MG PO TB24: 50.0000 mg | ORAL_TABLET | Freq: Every day | ORAL | 0 refills | Status: DC

## 2021-08-31 ENCOUNTER — Other Ambulatory Visit: Payer: Self-pay | Admitting: Family Medicine

## 2021-10-09 ENCOUNTER — Ambulatory Visit (INDEPENDENT_AMBULATORY_CARE_PROVIDER_SITE_OTHER): Payer: Medicare Other | Admitting: Student

## 2021-10-09 ENCOUNTER — Encounter: Payer: Self-pay | Admitting: Student

## 2021-10-09 VITALS — BP 157/84 | HR 88 | Ht 66.0 in | Wt 157.8 lb

## 2021-10-09 DIAGNOSIS — R198 Other specified symptoms and signs involving the digestive system and abdomen: Secondary | ICD-10-CM | POA: Diagnosis not present

## 2021-10-09 DIAGNOSIS — N529 Male erectile dysfunction, unspecified: Secondary | ICD-10-CM

## 2021-10-09 DIAGNOSIS — E785 Hyperlipidemia, unspecified: Secondary | ICD-10-CM

## 2021-10-09 DIAGNOSIS — R946 Abnormal results of thyroid function studies: Secondary | ICD-10-CM

## 2021-10-09 DIAGNOSIS — I10 Essential (primary) hypertension: Secondary | ICD-10-CM

## 2021-10-09 DIAGNOSIS — L299 Pruritus, unspecified: Secondary | ICD-10-CM

## 2021-10-09 MED ORDER — CETIRIZINE HCL 5 MG PO CHEW: 5.0000 mg | CHEWABLE_TABLET | Freq: Every day | ORAL | 0 refills | Status: DC

## 2021-10-09 NOTE — Patient Instructions (Signed)
It was great to see you! Thank you for allowing me to participate in your care!   I recommend that you always bring your medications to each appointment as this makes it easy to ensure we are on the correct medications and helps [place] not miss when refills are needed.  Our plans for today:  - We will go up on your metoprolol to 100 mg daily for your BP, stop at front desk for follow up in 2 weeks - Please stop the alcohol on arm-try vaseline on this area to hydrate this, no scented products on this arm  We are checking some labs today, I will call you if they are abnormal will send you a MyChart message or a letter if they are normal.  If you do not hear about your labs in the next 2 weeks please let us know.  Take care and seek immediate care sooner if you develop any concerns. Please remember to show up 15 minutes before your scheduled appointment time!  [name], MD [place]

## 2021-10-09 NOTE — Assessment & Plan Note (Addendum)
Pt reports a few months of L palm, anterior wrist, and anterior elbow itching. No rash present on physical exam. No fever, chills concerning for systemic cause of itching. Suspect possible contact component with using rubbing alcohol, possible neurogenic component given intermittently has pins and needle sensation. - Recommend pt stop using hydrogen peroxide; he can use Vaseline to moisturize the area - Will prescribe Zyrtec to improve symptoms - Labs ordered (BMP, Lipid panel, TSH) to monitor for other cause of itching

## 2021-10-09 NOTE — Assessment & Plan Note (Signed)
BP today is elevated at 157/84 (repeat still elevated). Pt is taking hydralazine 25 mg BID, Toprol 50 mg daily. - Recommend he increase Toprol to 100 mg daily to improve BP control - Will follow up in 2 weeks to assess BP - Labs ordered today

## 2021-10-09 NOTE — Progress Notes (Deleted)
    SUBJECTIVE:   CHIEF COMPLAINT / HPI:   Had ED visit in January for palpitation  PERTINENT  PMH / PSH: HTN, seizure disorder, partial rectal prolapse s/p banding/hemmorhoidectomy 02/2019  OBJECTIVE:   There were no vitals taken for this visit.  ***  ASSESSMENT/PLAN:   No problem-specific Assessment & Plan notes found for this encounter.     [name], MD [place]
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
## 2021-10-09 NOTE — Assessment & Plan Note (Signed)
Pt reports decreased efficacy of sildenafil (he is on max dose at 100 mg). Discussed options for further management, including switching to Cialis, trying brand-name Viagra, and referral to Urology for further evaluation. Pt would prefer not to do anything at this time. - Continue sildenafil - consider urology referral in future if patient interested

## 2021-10-10 LAB — LIPID PANEL
Chol/HDL Ratio: 2.7 ratio (ref 0.0–5.0)
Cholesterol, Total: 156 mg/dL (ref 100–199)
HDL: 57 mg/dL (ref >39)
LDL Chol Calc (NIH): 81 mg/dL (ref 0–99)
Triglycerides: 98 mg/dL (ref 0–149)
VLDL Cholesterol Cal: 18 mg/dL (ref 5–40)

## 2021-10-10 LAB — COMPREHENSIVE METABOLIC PANEL
ALT: 14 IU/L (ref 0–44)
AST: 20 IU/L (ref 0–40)
Albumin/Globulin Ratio: 1.3 (ref 1.2–2.2)
Albumin: 4.5 g/dL (ref 3.7–4.7)
Alkaline Phosphatase: 150 IU/L — ABNORMAL HIGH (ref 44–121)
BUN/Creatinine Ratio: 10 (ref 10–24)
BUN: 9 mg/dL (ref 8–27)
Bilirubin Total: 0.4 mg/dL (ref 0.0–1.2)
CO2: 23 mmol/L (ref 20–29)
Calcium: 10.1 mg/dL (ref 8.6–10.2)
Chloride: 102 mmol/L (ref 96–106)
Creatinine, Ser: 0.93 mg/dL (ref 0.76–1.27)
Globulin, Total: 3.4 g/dL (ref 1.5–4.5)
Glucose: 106 mg/dL — ABNORMAL HIGH (ref 70–99)
Potassium: 4.2 mmol/L (ref 3.5–5.2)
Sodium: 140 mmol/L (ref 134–144)
Total Protein: 7.9 g/dL (ref 6.0–8.5)
eGFR: 82 mL/min/{1.73_m2} (ref >59)

## 2021-10-10 LAB — TSH: TSH: 0.486 u[IU]/mL (ref 0.450–4.500)

## 2021-10-11 ENCOUNTER — Telehealth: Payer: Self-pay | Admitting: Student

## 2021-10-11 NOTE — Telephone Encounter (Signed)
Called patient and let him know alkaline phosphatase elevated. Patient had his gallbladder taken out in 2017. Lipid panel ok and TSH returned wnl range. Will follow up with him on 8/1 in clinic.

## 2021-10-14 ENCOUNTER — Other Ambulatory Visit: Payer: Self-pay | Admitting: Family Medicine

## 2021-10-14 DIAGNOSIS — I10 Essential (primary) hypertension: Secondary | ICD-10-CM

## 2021-10-17 ENCOUNTER — Other Ambulatory Visit: Payer: Self-pay | Admitting: Student

## 2021-10-17 DIAGNOSIS — L299 Pruritus, unspecified: Secondary | ICD-10-CM

## 2021-10-23 ENCOUNTER — Ambulatory Visit: Payer: Medicare Other | Admitting: Student

## 2021-10-31 ENCOUNTER — Other Ambulatory Visit: Payer: Self-pay | Admitting: Family Medicine

## 2021-11-01 ENCOUNTER — Telehealth: Payer: Self-pay

## 2021-11-01 NOTE — Telephone Encounter (Signed)
-----   Message from [name], MD sent at 10/31/2021 11:25 AM EDT ----- Can you schedule patient for BP follow up?  Thank you!  Phillip Hill

## 2021-11-01 NOTE — Telephone Encounter (Signed)
Contacted this pt 2x pt did not answer both attempts.

## 2021-11-06 ENCOUNTER — Other Ambulatory Visit: Payer: Self-pay | Admitting: Student

## 2021-11-24 ENCOUNTER — Other Ambulatory Visit: Payer: Self-pay | Admitting: Student

## 2021-11-27 ENCOUNTER — Ambulatory Visit (INDEPENDENT_AMBULATORY_CARE_PROVIDER_SITE_OTHER): Payer: Medicare Other | Admitting: Student

## 2021-11-27 ENCOUNTER — Encounter: Payer: Self-pay | Admitting: Student

## 2021-11-27 VITALS — BP 138/64 | HR 81 | Wt 157.0 lb

## 2021-11-27 DIAGNOSIS — I10 Essential (primary) hypertension: Secondary | ICD-10-CM

## 2021-11-27 MED ORDER — VALSARTAN 40 MG PO TABS: 40.0000 mg | ORAL_TABLET | Freq: Every day | ORAL | 0 refills | Status: DC

## 2021-11-27 NOTE — Assessment & Plan Note (Signed)
Per pharmacy team: - It has been noted that in 2015 patient was told by cardiology to discontinue lisinopril due to palpitations. I reviewed a telephone note from Fransico Him on 11/26/2013 stating "heart monitor showed NSR with occasional extra heart beats from the bottom of the heart which are benign". It appears the lisinopril may have been discontinued by error.  Blood pressure today 138/64 better than in the past of 150s over 80s. Will DC hydralazine and start valsartan 40 mg daily Continue metoprolol at lower dose 50 mg daily BP recheck and BMP in 2 weeks

## 2021-11-27 NOTE — Patient Instructions (Addendum)
It was great to see you! Thank you for allowing me to participate in your care!   Our plans for today:  - Please stop taking hydralazine - We will switch you to valsartan 40 mg daily and have you return in 2 weeks for a repeat blood pressure check and some labs - We will continue metoprolol at 50 mg daily instead  Take care and seek immediate care sooner if you develop any concerns.  [name], MD

## 2021-11-27 NOTE — Progress Notes (Signed)
    SUBJECTIVE:   CHIEF COMPLAINT / HPI:   Hypertension: Patient is a 81 y.o. male who present today for follow up of hypertension.   Patient endorses no problems and difficulties with metoprolol increase  Home medications include: hydralazine 25 mg TID and toprol 100 mg daily (in the morning)--has been taking only 1 a day 50 mg because of nausea) denies any lightheadedness or falls Patient endorses taking these medications Denies any headache, vision changes, shortness of breath, lower extremity swelling or chest pain   Most recent creatinine trend:  Lab Results  Component Value Date   CREATININE 0.93 10/09/2021   CREATININE 0.92 03/28/2021   CREATININE 1.00 01/10/2021   Patient does not check blood pressure at home.  Patient has had a BMP in the past 1 year.  PERTINENT  PMH / PSH: htn, seizure disorder  OBJECTIVE:   BP 138/64   Pulse 81   Wt 157 lb (71.2 kg)   SpO2 97%   BMI 25.34 kg/m   General: NAD, awake, alert, responsive to questions Head: Normocephalic atraumatic CV: Regular rate and rhythm  Respiratory: Clear to ausculation bilaterally, no wheezes rales or crackles, chest rises symmetrically,  no increased work of breathing Extremities: Moves upper and lower extremities freely, no edema in LE, no calf tenderness Neuro: No focal deficits  ASSESSMENT/PLAN:   Hypertension Per pharmacy team: - It has been noted that in 2015 patient was told by cardiology to discontinue lisinopril due to palpitations. I reviewed a telephone note from Fransico Him on 11/26/2013 stating "heart monitor showed NSR with occasional extra heart beats from the bottom of the heart which are benign".  It appears the lisinopril may have been discontinued by error.  Blood pressure today 138/64 better than in the past of 150s over 80s. Will DC hydralazine and start valsartan 40 mg daily Continue metoprolol at lower dose 50 mg daily BP recheck and BMP in 2 weeks   [name], MD [place]

## 2021-12-03 DIAGNOSIS — H04123 Dry eye syndrome of bilateral lacrimal glands: Secondary | ICD-10-CM | POA: Diagnosis not present

## 2021-12-03 DIAGNOSIS — H2513 Age-related nuclear cataract, bilateral: Secondary | ICD-10-CM | POA: Diagnosis not present

## 2021-12-11 ENCOUNTER — Ambulatory Visit: Payer: Medicare Other | Admitting: Student

## 2021-12-15 ENCOUNTER — Other Ambulatory Visit: Payer: Self-pay | Admitting: Family Medicine

## 2021-12-31 ENCOUNTER — Other Ambulatory Visit: Payer: Self-pay | Admitting: Family Medicine

## 2021-12-31 ENCOUNTER — Other Ambulatory Visit: Payer: Self-pay | Admitting: Student

## 2022-01-01 ENCOUNTER — Ambulatory Visit (INDEPENDENT_AMBULATORY_CARE_PROVIDER_SITE_OTHER): Payer: Medicare Other | Admitting: Student

## 2022-01-01 ENCOUNTER — Encounter: Payer: Self-pay | Admitting: Student

## 2022-01-01 VITALS — BP 181/84 | HR 100 | Ht 66.0 in | Wt 159.0 lb

## 2022-01-01 DIAGNOSIS — I10 Essential (primary) hypertension: Secondary | ICD-10-CM

## 2022-01-01 DIAGNOSIS — G40909 Epilepsy, unspecified, not intractable, without status epilepticus: Secondary | ICD-10-CM | POA: Diagnosis not present

## 2022-01-01 NOTE — Assessment & Plan Note (Addendum)
Uncontrolled.  Unsure exactly what patient is taking at home but right now he says he is taking metoprolol 50 mg daily and still has his hydralazine which he takes twice daily.  I called the pharmacy and confirmed that patient was unable to pick up his medication due to an insurance issue.  I discussed with them and they said that they can remove the hold on the medication and that you should be able to fill it today and he will [facility].  I discussed this with patient and told him to be ready for [facility] to [facility] his medication of valsartan 40 mg daily.  I discussed after he picks up his 40 mg of valsartan daily to stop the hydralazine that he has at home.  I asked him if he had any help with medication administration which she denies that he needs any help with.  He currently lives with his brother who is older than him and has not helpful for his care.  He says that he sometimes has help from his daughter however she does not live with them or help with medication administration. -Continue metoprolol 50 mg daily -Patient to pick up valsartan 40 mg daily-if having issues I discussed with patient to please call our [facility] clinic back for me to send something else in for him -I discussed that if he has shortness of breath, chest pain, any other episodes that were similar to Sunday to please go to the emergency room for assessment-patient overall well-appearing today although blood pressure is uncontrolled in the office at 181/84 on recheck -Continue conversations around healthcare power of attorney and medication management

## 2022-01-01 NOTE — Assessment & Plan Note (Signed)
Well-controlled, has not had a seizure in over 20 years.  I discussed I do not feel comfortable stopping and starting a new seizure medicine here in our [facility] clinic.  I discussed that it would be best care to follow-up with a neurologist.  He would like to see a different neurologist than previously. -Ambulatory referral to neurology placed -Continue phenytoin 300 mg nightly

## 2022-01-01 NOTE — Progress Notes (Signed)
SUBJECTIVE:   CHIEF COMPLAINT / HPI: Discussed medications  Patient says that on sunday night he started to have some shortness of breath and believed that he was having a heart attack at that time.  I asked if he had gone to the emergency room and he declined.  I asked if he has any symptoms of shortness of breath or chest pain today and he does not have any today.  He believes that his phenytoin has something to do with his heart issue/events that happened on Sunday.  He said he got a message from his pharmacy and that said that phenytoin can cause issues with heart and vision issues.  And he says that he believes the phenytoin that he has been taking since 1972 has caused these issues for him.  He would like to change to a different epileptic today.  Last neurology note was in 2018 and showed that he was seizure-free for 20 years on Dilantin 300 mg nightly.  At that time he had a low Dilantin level.  HTN Patient has been taking metoprolol 50 mg daily and he still is taking hydralazine twice daily.  I discussed that we have changed him from hydralazine to valsartan 40 mg daily however he says that he had an issue picking up the prescription at [facility] with his insurance.  He denies any headache, chest pain, shortness of breath, lower extremity swelling today.  PERTINENT  PMH / PSH: left temporal epilepsy  OBJECTIVE:   BP (!) 181/84   Pulse 100   Ht '5\' 6"'$  (1.676 m)   Wt 159 lb (72.1 kg)   SpO2 98%   BMI 25.66 kg/m   General: NAD, awake, alert, responsive to questions Head: Normocephalic atraumatic CV: Regular rate and rhythm no murmurs rubs or gallops Respiratory: Clear to ausculation bilaterally, no wheezes rales or crackles, chest rises symmetrically,  no increased work of breathing  ASSESSMENT/PLAN:   Hypertension Uncontrolled.  Unsure exactly what patient is taking at home but right now he says he is taking metoprolol 50 mg daily and still has his hydralazine which he takes twice  daily.  I called the pharmacy and confirmed that patient was unable to pick up his medication due to an insurance issue.  I discussed with them and they said that they can remove the hold on the medication and that you should be able to fill it today and he will receive a call from [facility].  I discussed this with patient and told him to be ready for call from [facility] to pick up his medication of valsartan 40 mg daily.  I discussed after he picks up his 40 mg of valsartan daily to stop the hydralazine that he has at home.  I asked him if he had any help with medication administration which she denies that he needs any help with.  He currently lives with his brother who is older than him and has not helpful for his care.  He says that he sometimes has help from his daughter however she does not live with them or help with medication administration. -Continue metoprolol 50 mg daily -Patient to pick up valsartan 40 mg daily-if having issues I discussed with patient to please call our [facility] clinic back for me to send something else in for him -I discussed that if he has shortness of breath, chest pain, any other episodes that were similar to Sunday to please go to the emergency room for assessment-patient overall well-appearing today although  blood pressure is uncontrolled in the office at 181/84 on recheck -Continue conversations around healthcare power of attorney and medication management  Seizure disorder Well-controlled, has not had a seizure in over 20 years.  I discussed I do not feel comfortable stopping and starting a new seizure medicine here in our [facility] clinic.  I discussed that it would be best care to follow-up with a neurologist.  He would like to see a different neurologist than previously. -Ambulatory referral to neurology placed -Continue phenytoin 300 mg nightly   [name], MD [place]

## 2022-01-01 NOTE — Patient Instructions (Addendum)
It was great to see you! Thank you for allowing me to participate in your care!   Our plans for today:  - I have referred you to neurology to switch you to a different medication  - I called the pharmacy and it shows that your insurance made a hold on this medicine-you pharmacy will call you with when you are able to pick up the new BP medication -Continue the metoprolol 50 mg daily -STOP the hydralazine    Take care and seek immediate care sooner if you develop any concerns.  [name], MD

## 2022-01-10 ENCOUNTER — Telehealth: Payer: Self-pay

## 2022-01-10 ENCOUNTER — Ambulatory Visit: Payer: Medicare Other

## 2022-01-10 NOTE — Telephone Encounter (Signed)
Patient's daughter calls nurse line regarding patient's symptoms since starting valsartan. Patient started taking medication on 10/10. Since starting, he has been experiencing dizziness, lightheadedness and blurry vision. He wants to stop taking valsartan and go back on hydralazine.   He is not currently having chest pain, SHOB or headaches.   Daughter also reports that BP has been elevated, does not have exact measurements.   Advised that patient be seen in clinic to check BP and discuss further med management.   Scheduled for this afternoon at 2:10. ED precautions discussed.   Talbot Grumbling, RN

## 2022-01-10 NOTE — Telephone Encounter (Signed)
Patient's daughter returns call to nurse line. Patient states that he is not going to come in for appointment this afternoon as BP is down to 134/87.   He plans to stop valsartan and is going to go back to taking hydralazine tomorrow morning.   Forwarding to PCP.   Discussed that while I would still recommend patient being seen for further management, if symptoms do not improve patient should definitely be evaluated.   Daughter verbalizes understanding.   Talbot Grumbling, RN

## 2022-01-14 ENCOUNTER — Encounter: Payer: Self-pay | Admitting: Neurology

## 2022-01-16 NOTE — Telephone Encounter (Signed)
Made appt for BP follow up on 10/30 at 2:30. Salvatore Marvel, CMA

## 2022-01-21 ENCOUNTER — Encounter: Payer: Self-pay | Admitting: Student

## 2022-01-21 ENCOUNTER — Ambulatory Visit (INDEPENDENT_AMBULATORY_CARE_PROVIDER_SITE_OTHER): Payer: Medicare Other | Admitting: Student

## 2022-01-21 DIAGNOSIS — I10 Essential (primary) hypertension: Secondary | ICD-10-CM

## 2022-01-21 MED ORDER — VALSARTAN 40 MG PO TABS: 20.0000 mg | ORAL_TABLET | Freq: Every day | ORAL | 0 refills | Status: DC

## 2022-01-21 NOTE — Patient Instructions (Signed)
It was great to see you! Thank you for allowing me to participate in your care!   Our plans for today:  - Lets try half the dose of your valsartan so 20 mg daily instead of the 40 mg daily - Stop the hydralazine - Continue your metoprolol - Stop at front desk for ambulatory BP monitoring with Dr. Valentina Lucks - Let's follow up in 2-3 weeks for a BP recheck with a nurse  Take care and seek immediate care sooner if you develop any concerns.  [name], MD

## 2022-01-21 NOTE — Assessment & Plan Note (Signed)
Controlled today but patient says he has headaches and gets BP up to 233K systolic at home. Has not be adherant to valsartan due to dizziness -decrease to 20 mg valsartan -re-iterated stopping hydralazine -continue metoprolol -ref to pharmacy clinic for ambulatory BP monitor -CCM referral for med adherance

## 2022-01-21 NOTE — Progress Notes (Signed)
    SUBJECTIVE:   CHIEF COMPLAINT / HPI:   Hypertension: Patient is a 81 y.o. male who present today for follow up of hypertension.   Home medications include: metoprolol 50 mg daily, valsartan 40 mg daily  Patient endorses issues with valsartan 40 mg > experiencing dizziness, lightheadedness and blurry vision. He stopped valsartan and started taking his old hydralazine 25 mg (taking in morning and night).   Does have headache, vision changes. Denies any shortness of breath, lower extremity swelling or chest pain   Most recent creatinine trend:  Lab Results  Component Value Date   CREATININE 0.93 10/09/2021   CREATININE 0.92 03/28/2021   CREATININE 1.00 01/10/2021   Patient does check blood pressure at home. Has been running over 157W systolic.   Patient has had a BMP in the past 1 year.  PERTINENT  PMH / PSH: htn  OBJECTIVE:   BP (!) 138/58   Pulse 74   Ht '5\' 6"'$  (1.676 m)   Wt 158 lb 9.6 oz (71.9 kg)   SpO2 96%   BMI 25.60 kg/m   General: Well appearing, NAD, awake, alert, responsive to questions Head: Normocephalic atraumatic CV: Regular rate and rhythm no murmurs rubs or gallops Respiratory: Clear to ausculation bilaterally, no wheezes rales or crackles, chest rises symmetrically,  no increased work of breathing Extremities: Moves upper and lower extremities freely, no edema in LE  ASSESSMENT/PLAN:   Hypertension Controlled today but patient says he has headaches and gets BP up to 620B systolic at home. Has not be adherant to valsartan due to dizziness -decrease to 20 mg valsartan -re-iterated stopping hydralazine -continue metoprolol -ref to pharmacy clinic for ambulatory BP monitor -CCM referral for med adherance   [name], MD [place]

## 2022-01-22 ENCOUNTER — Other Ambulatory Visit: Payer: Self-pay | Admitting: Student

## 2022-01-22 ENCOUNTER — Other Ambulatory Visit: Payer: Self-pay | Admitting: Family Medicine

## 2022-01-22 DIAGNOSIS — I10 Essential (primary) hypertension: Secondary | ICD-10-CM

## 2022-01-29 ENCOUNTER — Ambulatory Visit (INDEPENDENT_AMBULATORY_CARE_PROVIDER_SITE_OTHER): Payer: Medicare Other | Admitting: Pharmacist

## 2022-01-29 DIAGNOSIS — I10 Essential (primary) hypertension: Secondary | ICD-10-CM | POA: Diagnosis not present

## 2022-01-29 MED ORDER — OLMESARTAN MEDOXOMIL 5 MG PO TABS: 5.0000 mg | ORAL_TABLET | Freq: Every day | ORAL | 1 refills | Status: DC

## 2022-01-29 NOTE — Patient Instructions (Signed)
It was great to see you today!  We made the following changes to you blood pressure medication:  STOP hydralazine  START olmesartan 5 mg daily  CONTINUE metoprolol

## 2022-01-29 NOTE — Assessment & Plan Note (Signed)
Hypertension uncontrolled on current medications. BP goal < 130/80 mmHg, however need to be cautious of low DBP which may necessitate higher BP goal. Control is suboptimal due to inadequate regimen and non-adherence to plan. Will not perform 24 hr ambulatory BP monitoring today because pt is not taking ARB and restarted hydralazine. -Started olmesartan 5 mg once daily. Instructed pt to reach out to provider with signs of dizziness, lightheadedness. If pt tolerates low-dose ARB, will plan to perform 24 hr ambulatory BP monitoring in ~1 week to help optimize regimen.  - Continue metoprolol 50 mg once daily - STOP hydralazine

## 2022-01-29 NOTE — Progress Notes (Signed)
   S:     Chief Complaint  Patient presents with   Medication Management    Ambulatory BP monitor / Med change   81 y.o. male who presents for hypertension evaluation, education, and management.  PMH is significant for HTN, HLD, seizure disorder, HSV2.  Patient was referred and last seen by Primary Care Provider, Dr. Jinny Sanders, on 01/21/2022.   At last visit, instructed to decrease valsartan from 40 mg to 20 mg (half tablet) after reports of dizziness, stop hydralazine, and continue metoprolol. Referred for 24hr  ambulatory BP monitor.  Today, patient arrives in good spirits and presents with his daughter. Denies dizziness, headache, blurred vision, swelling. However, pt reports that the valsartan tablet was too small to split, so he resumed his home hydralazine 25 mg BID. He did continue taking metoprolol.  Family/Social history: daughter Miliscent Skillin supports patient and joined the visit after initial history taking.   Medication adherence consistent however not taking ARB - valsartan. Patient has taken hydralazine medication since last visit - stating he could not break valsartan in half.   Current antihypertensives include: hydralazine 25 mg BID, metoprolol 50 mg daily  Antihypertensives tried in the past include: valsartan 40 mg (dizziness, lightheadedness)  ASCVD risk factors include: HTN  O:  ROS  Physical Exam Constitutional:      Appearance: Normal appearance.  Pulmonary:     Effort: Pulmonary effort is normal.  Neurological:     Mental Status: He is alert.  Psychiatric:        Mood and Affect: Mood normal.        Behavior: Behavior normal.        Thought Content: Thought content normal.     Last 3 Office BP readings: BP Readings from Last 3 Encounters:  01/21/22 (!) 138/58  01/01/22 (!) 181/84  11/27/21 138/64    BMET    Component Value Date/Time   NA 140 10/09/2021 1511   K 4.2 10/09/2021 1511   CL 102 10/09/2021 1511   CO2 23 10/09/2021 1511    GLUCOSE 106 (H) 10/09/2021 1511   GLUCOSE 121 (H) 03/28/2021 0615   BUN 9 10/09/2021 1511   CREATININE 0.93 10/09/2021 1511   CALCIUM 10.1 10/09/2021 1511   GFRNONAA >60 03/28/2021 0615   GFRAA 80 02/08/2019 1506    Patient had dual Medicare/Medicaid coverage.   A/P: Hypertension uncontrolled on current medications. BP goal < 130/80 mmHg, however need to be cautious of low DBP which may necessitate higher BP goal. Control is suboptimal due to inadequate regimen and non-adherence to plan. Will not perform 24 hr ambulatory BP monitoring today because pt is not taking ARB and restarted hydralazine. -Started olmesartan 5 mg once daily. Instructed pt to reach out to provider with signs of dizziness, lightheadedness. If pt tolerates low-dose ARB, will plan to perform 24 hr ambulatory BP monitoring in ~1 week to help optimize regimen.  - Continue metoprolol 50 mg once daily - STOP hydralazine  -Patient educated on purpose, proper use, and potential adverse effects of olmesartan.  -F/u labs - BMET to monitor SCr and K in 3-4 weeks if pt tolerates ARB  Results reviewed and written information provided.    Written patient instructions provided. Patient verbalized understanding of treatment plan.  Total time in face to face counseling 20 minutes.    Follow-up:  Pharmacist 02/05/2022. Patient seen with Geraldo Docker, PharmD Candidate and Park Liter, PharmD Candidate. Marland Kitchen

## 2022-02-05 ENCOUNTER — Ambulatory Visit (INDEPENDENT_AMBULATORY_CARE_PROVIDER_SITE_OTHER): Payer: Medicare Other | Admitting: Pharmacist

## 2022-02-05 VITALS — Wt 157.8 lb

## 2022-02-05 DIAGNOSIS — I10 Essential (primary) hypertension: Secondary | ICD-10-CM

## 2022-02-05 NOTE — Progress Notes (Unsigned)
S:     Chief Complaint  Patient presents with   Medication Management    Amb BP Monitor   81 y.o. male who presents for hypertension evaluation, education, and management.  PMH is significant for hypertension and seizures.  Patient was referred and last seen by Primary Care Provider, Dr. Jinny Sanders, on 01/21/22. Patient was seen by there pharmacy clinic on 11/07.  At last visit, hydralazine was discontinued and olmesartan 5 mg was initiated.   Diagnosed with Hypertension > 15 years ago.    Medication compliance is reported to be good.  Headaches have improved since switching from hydralazine to olmesartan.  Discussed procedure for wearing the monitor and gave patient written instructions. Monitor was placed on non-dominant arm with instructions to return in the morning.   Current BP Medications include:  olmesartan 5 mg, metoprrolol succinate 50 mg   Antihypertensives tried in the past include: hydralazine  Dietary habits include:  Day #2 - Patient returns to clinic and reports:  no issues with device and reports taking a mid-day nap. Patient reports they were able to wear the Ambulatory Blood Pressure Cuff for the entire 24 evaluation period.   O:  Review of Systems  Neurological:  Negative for headaches (decrease in frequency - fewer currently).    Physical Exam Constitutional:      Appearance: Normal appearance. He is normal weight.  Pulmonary:     Effort: Pulmonary effort is normal.  Neurological:     Mental Status: He is alert.  Psychiatric:        Mood and Affect: Mood normal.        Thought Content: Thought content normal.     Last 3 Office BP readings: BP Readings from Last 3 Encounters:  01/21/22 (!) 138/58  01/01/22 (!) 181/84  11/27/21 138/64   Clinical Atherosclerotic Cardiovascular Disease (ASCVD): No   Basic Metabolic Panel    Component Value Date/Time   NA 140 10/09/2021 1511   K 4.2 10/09/2021 1511   CL 102 10/09/2021 1511   CO2 23  10/09/2021 1511   GLUCOSE 106 (H) 10/09/2021 1511   GLUCOSE 121 (H) 03/28/2021 0615   BUN 9 10/09/2021 1511   CREATININE 0.93 10/09/2021 1511   CALCIUM 10.1 10/09/2021 1511   GFRNONAA >60 03/28/2021 0615   GFRAA 80 02/08/2019 1506    ABPM Study Data: Arm Placement left arm  Overall Mean 24hr BP:   140/62 mmHg  HR: 67  Daytime Mean BP:  142/64 mmHg  HR: 67  Nighttime Mean BP:  136/56 mmHg  HR: 68  Dipping Pattern: No.  Sys:   4.5%   Dia: 13.1%   [normal dipping ~10-20%]   For Office Goal Goal BP of <140/90:  ABPM thresholds: Overall BP < 130/80, daytime BP <135/85 mmHg, sleeptime BP <120/70 mmHg   Patient is participating in a Managed Medicaid Plan:  Yes   A/P: History of hypertension longstanding currently taking; combination therapy of metoprolol and olmesartan with goal presssure of <160 systolic while maintaining diastolic > 60 if possible.     Found to have isolated systolic hypertension with 24-hour ambulatory blood pressure evaluation which demonstrates an average AWAKE blood pressure of 142/63mHb.  Nocturnal dipping pattern is less than ideal but had some reduction in blood pressure while asleep .  Denies any significant dizziness or orthostatic symptoms recently.  - Continue same doses of metoprolol and olmesartan.   Results reviewed and information provided to patient, daughter and PCP, Dr. JJinny Sanders  Written patient instructions provided. Patient verbalized understanding of treatment plan.  Total time in face to face counseling 27 minutes.    Follow-up:  Pharmacist PRN - no planned follow-up. PCP clinic visit in PRN.  Patient seen with Geraldo Docker, PharmD Candidate and Park Liter, PharmD Candidate.

## 2022-02-05 NOTE — Patient Instructions (Signed)
Blood Pressure Activity Diary Time Lying down/ Sleeping Walking/ Exercise Stressed/ Angry Headache/ Pain Dizzy  9 AM       10 AM       11 AM       12 PM       1 PM       2 PM       Time Lying down/ Sleeping Walking/ Exercise Stressed/ Angry Headache/ Pain Dizzy  3 PM       4 PM        5 PM       6 PM       7 PM       8 PM       Time Lying down/ Sleeping Walking/ Exercise Stressed/ Angry Headache/ Pain Dizzy  9 PM       10 PM       11 PM       12 AM       1 AM       2 AM       3 AM       Time Lying down/ Sleeping Walking/ Exercise Stressed/ Angry Headache/ Pain Dizzy  4 AM       5 AM       6 AM       7 AM       8 AM       9 AM       10 AM        Time you woke up: _________                  Time you went to sleep:__________  Come back tomorrow at 11 to have the monitor removed Call the [facility] if you have any questions before then [phone])  Wearing the Blood Pressure Monitor The cuff will inflate every 20 minutes during the day and every 30 minutes while you sleep. Your blood pressure readings will NOT display after cuff inflation Fill out the blood pressure-activity diary during the day, especially during activities that may affect your reading -- such as exercise, stress, walking, taking your blood pressure medications  Important things to know: Avoid taking the monitor off for the next 24 hours, unless it causes you discomfort or pain. Do NOT get the monitor wet and do NOT dry to clean the monitor with any cleaning products. Do NOT put the monitor on anyone else's arm. When the cuff inflates, avoid excess movement. Let the cuffed arm hang loosely, slightly away from the body. Avoid flexing the muscles or moving the hand/fingers. When you go to sleep, make sure that the hose is not kinked. Remember to fill out the blood pressure activity diary. If you experience severe pain or unusual pain (not associated with getting your blood pressure checked),  remove the monitor.  Troubleshooting:  Code  Troubleshooting   1  Check cuff position, tighten cuff   2, 3  Remain still during reading   4, 87  Check air hose connections and make sure cuff is tight   85, 89  Check hose connections and make tubing is not crimped   86  Push START/STOP to restart reading   88, 91  Retry by pushing START/STOP   90  Replace batteries. If problem persists, remove monitor and bring back to   clinic at follow up   97, 98, 99  Service required - Remove monitor and bring back to clinic at  follow up    Day #2 - No change to blood pressure medications.  Continue same.

## 2022-02-06 NOTE — Progress Notes (Signed)
Reviewed: I agree with Dr. Koval's documentation and management. 

## 2022-02-06 NOTE — Assessment & Plan Note (Addendum)
History of hypertension longstanding currently taking; combination therapy of metoprolol and olmesartan with goal presssure of <481 systolic while maintaining diastolic > 60 if possible.     Found to have isolated systolic hypertension with 24-hour ambulatory blood pressure evaluation which demonstrates an average AWAKE blood pressure of 142/47mHb.  Nocturnal dipping pattern isless than ideal but had some reduction in blood pressure while asleep.  Denies any significant dizziness or orthostatic symptoms recently.  - Continue same doses of metoprolol and olmesartan.

## 2022-02-11 ENCOUNTER — Ambulatory Visit (INDEPENDENT_AMBULATORY_CARE_PROVIDER_SITE_OTHER): Payer: Medicare Other | Admitting: Neurology

## 2022-02-11 ENCOUNTER — Encounter: Payer: Self-pay | Admitting: Neurology

## 2022-02-11 VITALS — BP 165/76 | HR 84 | Ht 66.0 in | Wt 158.2 lb

## 2022-02-11 DIAGNOSIS — H538 Other visual disturbances: Secondary | ICD-10-CM | POA: Diagnosis not present

## 2022-02-11 DIAGNOSIS — G40009 Localization-related (focal) (partial) idiopathic epilepsy and epileptic syndromes with seizures of localized onset, not intractable, without status epilepticus: Secondary | ICD-10-CM

## 2022-02-11 NOTE — Patient Instructions (Signed)
Good to see you again.   Referral will be sent to an ophthalmologist  2. Continue Dilantin '300mg'$  every night  3. Follow-up in 6 months, call for any changes   Seizure Precautions: 1. If medication has been prescribed for you to prevent seizures, take it exactly as directed.  Do not stop taking the medicine without talking to your doctor first, even if you have not had a seizure in a long time.   2. Avoid activities in which a seizure would cause danger to yourself or to others.  Don't operate dangerous machinery, swim alone, or climb in high or dangerous places, such as on ladders, roofs, or girders.  Do not drive unless your doctor says you may.  3. If you have any warning that you may have a seizure, lay down in a safe place where you can't hurt yourself.    4.  No driving for 6 months from last seizure, as per [facility].   Please refer to the following link on the [place] website for more information: http://[email or web address]/[email or web address]/driving/[email or web address]   5.  Maintain good sleep hygiene.Avoid alcohol.  6.  Contact your doctor if you have any problems that may be related to the medicine you are taking.  7.  Call 911 and bring the patient back to the ED if:        A.  The seizure lasts longer than 5 minutes.       B.  The patient doesn't awaken shortly after the seizure  C.  The patient has new problems such as difficulty seeing, speaking or moving  D.  The patient was injured during the seizure  E.  The patient has a temperature over 102 F (39C)  F.  The patient vomited and now is having trouble breathing

## 2022-02-11 NOTE — Progress Notes (Signed)
NEUROLOGY CONSULTATION NOTE  Phillip Hill MRN: [id number] DOB: 31-Jul-1940  Referring provider: Dr. Talbert Cage Primary care provider: Dr. [name]  Reason for consult:  seizures  Dear Dr Erin Hearing:  Thank you for your kind referral of Phillip Hill for consultation of the above symptoms. Although his history is well known to you, please allow me to reiterate it for the purpose of our medical record. He is alone in the office today. Records and images were personally reviewed where available.   HISTORY OF PRESENT ILLNESS: This is a pleasant 81 year old man, originally right-handed, but since right arm amputation at age 81, he has been using his left hand, presenting for evaluation of seizure medication. He saw his PCP last month and reported some shortness of breath. Per notes, "he believed his phenytoin has something to do with his heart issue/events that happened that time. He said he got a message from his pharmacy that phenytoin can cause issues with heart and vision issues. He says he believes the phenytoin that he has been taking since 1972 has caused these issues for him. He would like to change to a different epileptic today."  He states he is concerned that his pharmacist told him Phenytoin can cause bradycardia and vision changes. I discussed with him that he is not bradycardic today (HR 84), it was 74 bpm on prior PCP visit. With regards to his vision, he states it is blurry, he wakes up and keeps rubbing his eyes like there is gravel in it. He reports seeing 2 eye doctors, one in [place], and an optometrist, who gave him eye drops with no improvement. He denies any double vision, dizziness, gait instability.   He was seen one time in our office in September 2018. He started having seizures in the 1970s. He has been on Dilantin '300mg'$  qhs since then. He reports that all his seizures have been nocturnal, however prior notes indicate that he had episodes of confusion, he would  walk then fall on the ground with no warning. EEG in November 1988 reported dysrhythmia in the left temporal region, there were focal sharp waves and sharp and slow wave discharges seen in the left frontotemporal region. He had a normal head CT, LP, then another normal EEG. His last known seizure was in 1995. He reports all his seizures have been in the setting of stopping/missing medication. Historically he has had low Dilantin levels but has remained seizure-free and stayed on same dose of Dilantin '300mg'$  qhs over the years. He lives with his brother and grandson. He denies any staring/unresponsive episodes, gaps in time, focal numbness/tingling/weakness, myoclonic jerks. In the past he recalls having olfactory hallucinations, he has not had these in a long time. He had some headaches with increased BP, some improved with medication adjustment. He states today that headaches mostly occur when he is not eating like he should or when BP is high. He sleeps well. He stopped driving a year ago stating he does not care much about driving.   Epilepsy Risk Factors:  His brother has seizures. He had a normal birth and early development.  There is no history of febrile convulsions, CNS infections such as meningitis/encephalitis, significant traumatic brain injury, neurosurgical procedures    PAST MEDICAL HISTORY: Past Medical History:  Diagnosis Date   Arthritis    Chronic cystitis    Complex partial seizure disorder ([place])    began 1970's--  last seizure 1995  per neurologist note 10/ 2015--  dr  yan   Diverticulosis of colon    Frequency of urination    Hemorrhoids    History of herpes simplex type 2 infection    History of lower GI bleeding    secondary to ASA use--  resolved   Hypertension    Lazy eye of left side    Nocturia    PAC (premature atrial contraction)    Seizures ([facility])    Urgency of urination     PAST SURGICAL HISTORY: Past Surgical History:  Procedure Laterality Date   ARM  AMPUTATION Right 1960   infection due to injury   CHOLECYSTECTOMY N/A 10/22/2015   Procedure: LAPAROSCOPIC CHOLECYSTECTOMY WITH INTRAOPERATIVE CHOLANGIOGRAM;  Surgeon: Stark Klein, MD;  Location: [facility];  Service: General;  Laterality: N/A;   COLONOSCOPY  10/24/2011   Procedure: COLONOSCOPY;  Surgeon: Milus Banister, MD;  Location: [facility];  Service: Endoscopy;  Laterality: N/A;   CYSTOSCOPY WITH BIOPSY N/A 06/28/2014   Procedure: CYSTOSCOPY WITH BIOPSY;  Surgeon: Lowella Bandy, MD;  Location: [facility];  Service: Urology;  Laterality: N/A;   INGUINAL HERNIA REPAIR Left 09-15-2009   TRANSTHORACIC ECHOCARDIOGRAM  10-28-2013   dr Tressia Miners turner   mild focal basal hypertrophy of septum/  ef 29-79%/  grade I diastolic dysfunction/  mild AR/  trivial MR and TR    MEDICATIONS: Current Outpatient Medications on File Prior to Visit  Medication Sig Dispense Refill   diphenhydrAMINE (BENADRYL) 25 MG tablet Take 25 mg by mouth every 6 (six) hours as needed.     metoprolol succinate (TOPROL-XL) 50 MG 24 hr tablet TAKE 1 TABLET BY MOUTH EVERY DAY 60 tablet 0   Multiple Vitamin (MULTIVITAMIN WITH MINERALS) TABS tablet Take 1 tablet by mouth daily.     naproxen sodium (ALEVE) 220 MG tablet Take 220 mg by mouth daily as needed.     olmesartan (BENICAR) 5 MG tablet Take 1 tablet (5 mg total) by mouth daily. 30 tablet 1   phenytoin (DILANTIN) 100 MG ER capsule TAKE 3 CAPSULES BY MOUTH AT BEDTIME 270 capsule 0   sildenafil (VIAGRA) 100 MG tablet Take 0.5-1 tablets (50-100 mg total) by mouth daily as needed for erectile dysfunction. 5 tablet 11   valACYclovir (VALTREX) 500 MG tablet TAKE 1 TABLET BY MOUTH EVERY DAY 90 tablet 0   No current facility-administered medications on file prior to visit.    ALLERGIES: Allergies  Allergen Reactions   Ciprofloxacin Other (See Comments)    "shaking, like cold chills"   Penicillins Hives    FAMILY HISTORY: Family History  Problem Relation Age of  Onset   Cancer Mother    Cancer Sister    Cancer Brother     SOCIAL HISTORY: Social History   Socioeconomic History   Marital status: Divorced    Spouse name: Not on file   Number of children: 2   Years of education: 9 th   Highest education level: Not on file  Occupational History    Comment: Retired/Disabled  Tobacco Use   Smoking status: Former    Types: Cigarettes    Quit date: 10/23/1968    Years since quitting: 53.3    Passive exposure: Past   Smokeless tobacco: Never  Vaping Use   Vaping Use: Never used  Substance and Sexual Activity   Alcohol use: No    Comment: Quit alcohol in 1970   Drug use: No   Sexual activity: Not on file  Other Topics Concern   Not on  file  Social History Narrative   Patient lives at home his brother stayes with him and grandson .Retired / Disabled.Education 9 th gradeCaffeine Six cups of coffee daily.Left handed.   Social Determinants of Health   Financial Resource Strain: Not on file  Food Insecurity: Not on file  Transportation Needs: Not on file  Physical Activity: Not on file  Stress: Not on file  Social Connections: Not on file  Intimate Partner Violence: Not on file     PHYSICAL EXAM: Vitals:   02/11/22 1240  BP: (!) 165/76  Pulse: 84  SpO2: 97%   General: No acute distress Head:  Normocephalic/atraumatic Skin/Extremities: No rash, no edema, right arm prosthesis Neurological Exam: Mental status: alert and awake, no dysarthria or aphasia, Fund of knowledge is appropriate.  Attention and concentration are normal.    Cranial nerves: CN I: not tested CN II: pupils equal, round, visual fields intact CN III, IV, VI:  left esotropia (chronic) with full range of motion, no nystagmus, no ptosis CN V: facial sensation intact CN VII: upper and lower face symmetric CN VIII: hearing intact to conversation Bulk & Tone: normal, no fasciculations. Motor: 5/5 throughout (prosthetic right arm) Sensation: intact to light touch,  cold.  No extinction to double simultaneous stimulation.  Romberg test negative Deep Tendon Reflexes: +2 throughout Cerebellar: no incoordination on finger to nose on left Gait: narrow-based and steady reporting left hip pain, able to tandem walk adequately. Tremor: none   IMPRESSION: This is an 81 year old man with a history of left temporal lobe epilepsy, seizure-free since 1995 on chronic Dilantin therapy. He has been on '300mg'$  qhs and recently expressed concerns after being told by his pharmacist that Dilantin can cause bradycardia and vision changes. Reassured patient that he is not bradycardic. Continue BP control with PCP. We also discussed his vision concerns, he reports blurry vision and sensation of gravel in his eyes. I discussed with him that this is not typical for Dilantin, recommend formal ophthalmology evaluation. If their evaluation indicates this is not a primary eye issue, we discussed that we can certainly discuss switching to a different seizure medication and assess if there is improvement off Dilantin. We discussed risks of breakthrough seizure with any medication adjustment. At this time, I would recommend continuation of Dilantin '300mg'$  qhs. He does not drive. Follow-up in 6 months, call for any changes.     Thank you for allowing me to participate in the care of this patient. Please do not hesitate to call for any questions or concerns.   Ellouise Newer, M.D.  CC: Dr. Jinny Sanders, Dr. Erin Hearing

## 2022-02-20 ENCOUNTER — Other Ambulatory Visit: Payer: Self-pay | Admitting: Family Medicine

## 2022-02-20 DIAGNOSIS — I10 Essential (primary) hypertension: Secondary | ICD-10-CM

## 2022-03-17 ENCOUNTER — Other Ambulatory Visit: Payer: Self-pay | Admitting: Student

## 2022-03-18 ENCOUNTER — Other Ambulatory Visit: Payer: Self-pay | Admitting: Student

## 2022-04-04 ENCOUNTER — Other Ambulatory Visit: Payer: Self-pay | Admitting: Student

## 2022-04-10 ENCOUNTER — Encounter: Payer: Self-pay | Admitting: Neurology

## 2022-06-25 ENCOUNTER — Other Ambulatory Visit: Payer: Self-pay | Admitting: Student

## 2022-06-28 ENCOUNTER — Telehealth: Payer: Self-pay | Admitting: Student

## 2022-06-28 NOTE — Telephone Encounter (Signed)
Contacted Phillip Hill to schedule their annual wellness visit. Appointment made for 07/02/2022.  Thank you,  [facility] Support [facility] Direct dial  [phone]

## 2022-07-01 ENCOUNTER — Telehealth: Payer: Self-pay | Admitting: Student

## 2022-07-01 NOTE — Telephone Encounter (Signed)
I attempted to leave a message to remind patient of AWV phone call on 07/02/2022 at 8:15.

## 2022-07-02 ENCOUNTER — Encounter (INDEPENDENT_AMBULATORY_CARE_PROVIDER_SITE_OTHER): Payer: Medicare Other

## 2022-07-02 ENCOUNTER — Telehealth: Payer: Self-pay

## 2022-07-02 VITALS — Ht 66.0 in | Wt 158.0 lb

## 2022-07-02 DIAGNOSIS — Z0000 Encounter for general adult medical examination without abnormal findings: Secondary | ICD-10-CM

## 2022-07-02 NOTE — Telephone Encounter (Signed)
Contacted patient on preferred number listed in notes for scheduled AWV. During visit patient decided not to complete this visit. Noted encounter as erroneous visit.

## 2022-07-02 NOTE — Progress Notes (Signed)
Subjective:   Phillip Hill is a 82 y.o. male who presents for Medicare Annual/Subsequent preventive examination.  Review of Systems    Virtual Visit via Telephone Note  I connected with  Phillip Hill on 07/02/22 at  8:15 AM EDT by telephone and verified that I am speaking with the correct person using two identifiers.  Location: Patient: Home Provider: Office Persons participating in the virtual visit: patient/Nurse Health Advisor   I discussed the limitations, risks, security and privacy concerns of performing an evaluation and management service by telephone and the availability of in person appointments. The patient expressed understanding and agreed to proceed.  Interactive audio and video telecommunications were attempted between this nurse and patient, however failed, due to patient having technical difficulties OR patient did not have access to video capability.  We continued and completed visit with audio only.  Some vital signs may be absent or patient reported.   Tillie RungBeverly W Barron Vanloan, LPN        Objective:    Today's Vitals   07/02/22 0823  Weight: 158 lb (71.7 kg)  Height: 5\' 6"  (1.676 m)   Body mass index is 25.5 kg/m.     02/11/2022   12:44 PM 01/21/2022    2:18 PM 01/01/2022    3:52 PM 11/27/2021    3:16 PM 10/09/2021    2:13 PM 03/28/2021    4:21 PM 03/28/2021    6:02 AM  Advanced Directives  Does Patient Have a Medical Advance Directive? Yes No No No No No No  Type of Advance Directive Living will        Would patient like information on creating a medical advance directive?  No - Patient declined No - Patient declined No - Patient declined No - Patient declined      Current Medications (verified) Outpatient Encounter Medications as of 07/02/2022  Medication Sig   diphenhydrAMINE (BENADRYL) 25 MG tablet Take 25 mg by mouth every 6 (six) hours as needed.   metoprolol succinate (TOPROL-XL) 50 MG 24 hr tablet TAKE 1 TABLET BY MOUTH EVERY DAY   Multiple Vitamin  (MULTIVITAMIN WITH MINERALS) TABS tablet Take 1 tablet by mouth daily.   naproxen sodium (ALEVE) 220 MG tablet Take 220 mg by mouth daily as needed.   olmesartan (BENICAR) 5 MG tablet TAKE 1 TABLET (5 MG TOTAL) BY MOUTH DAILY.   phenytoin (DILANTIN) 100 MG ER capsule TAKE 3 CAPSULES BY MOUTH AT BEDTIME   sildenafil (VIAGRA) 100 MG tablet Take 0.5-1 tablets (50-100 mg total) by mouth daily as needed for erectile dysfunction.   valACYclovir (VALTREX) 500 MG tablet TAKE 1 TABLET BY MOUTH EVERY DAY   No facility-administered encounter medications on file as of 07/02/2022.    Allergies (verified) Ciprofloxacin and Penicillins   History: Past Medical History:  Diagnosis Date   Arthritis    Chronic cystitis    Complex partial seizure disorder    began 381970's--  last seizure 1995  per neurologist note 10/ 2015--  dr Terrace Arabiayan   Diverticulosis of colon    Frequency of urination    Hemorrhoids    History of herpes simplex type 2 infection    History of lower GI bleeding    secondary to ASA use--  resolved   Hypertension    Lazy eye of left side    Nocturia    PAC (premature atrial contraction)    Seizures    Urgency of urination    Past Surgical History:  Procedure Laterality Date  ARM AMPUTATION Right 1960   infection due to injury   CHOLECYSTECTOMY N/A 10/22/2015   Procedure: LAPAROSCOPIC CHOLECYSTECTOMY WITH INTRAOPERATIVE CHOLANGIOGRAM;  Surgeon: Almond Lint, MD;  Location: [facility];  Service: General;  Laterality: N/A;   COLONOSCOPY  10/24/2011   Procedure: COLONOSCOPY;  Surgeon: Rachael Fee, MD;  Location: [facility];  Service: Endoscopy;  Laterality: N/A;   CYSTOSCOPY WITH BIOPSY N/A 06/28/2014   Procedure: CYSTOSCOPY WITH BIOPSY;  Surgeon: Su Grand, MD;  Location: [facility];  Service: Urology;  Laterality: N/A;   INGUINAL HERNIA REPAIR Left 09-15-2009   TRANSTHORACIC ECHOCARDIOGRAM  10-28-2013   dr Gloris [place] turner   mild focal basal hypertrophy of septum/  ef 60-65%/   grade I diastolic dysfunction/  mild AR/  trivial MR and TR   Family History  Problem Relation Age of Onset   Cancer Mother    Cancer Sister    Cancer Brother    Social History   Socioeconomic History   Marital status: Divorced    Spouse name: Not on file   Number of children: 2   Years of education: 9 th   Highest education level: Not on file  Occupational History    Comment: Retired/Disabled  Tobacco Use   Smoking status: Former    Types: Cigarettes    Quit date: 10/23/1968    Years since quitting: 53.7    Passive exposure: Past   Smokeless tobacco: Never  Vaping Use   Vaping Use: Never used  Substance and Sexual Activity   Alcohol use: No    Comment: Quit alcohol in 1970   Drug use: No   Sexual activity: Not on file  Other Topics Concern   Not on file  Social History Narrative   Patient lives at home his brother stayes with him and grandson .Retired / Disabled.Education 9 th gradeCaffeine Six cups of coffee daily.Left handed.   Social Determinants of Health   Financial Resource Strain: Not on file  Food Insecurity: Not on file  Transportation Needs: Not on file  Physical Activity: Not on file  Stress: Not on file  Social Connections: Not on file    Tobacco Counseling Counseling given: Not Answered   Clinical Intake:  Pre-visit preparation completed: No  Pain : No/denies pain     BMI - recorded: 25.5 Nutritional Risks: None Diabetes: No  How often do you need to have someone help you when you read instructions, pamphlets, or other written materials from your doctor or pharmacy?: 1 - Never  Diabetic? No  Interpreter Needed?: No  Information entered by :: Theresa Mulligan LPN   Activities of Daily Living     No data to display          Patient Care Team: Levin Erp, MD as PCP - General (Family Medicine) Rene Paci, MD as Consulting Physician (Urology) Karie Soda, MD as Consulting Physician (General  Surgery) Rachael Fee, MD as Attending Physician (Gastroenterology) Van Clines, MD as Consulting Physician (Neurology)  Indicate any recent Medical Services you may have received from other than [facility] providers in the past year (date may be approximate).     Assessment:   This is a routine wellness examination for [place].  Hearing/Vision screen No results found.  Dietary issues and exercise activities discussed:     Goals Addressed   None    Depression Screen    01/21/2022    2:28 PM 01/01/2022    3:51 PM 11/27/2021    3:16  PM 10/09/2021    2:12 PM 02/06/2021    4:22 PM 12/20/2020    2:23 PM 05/26/2019    8:31 AM  PHQ 2/9 Scores  PHQ - 2 Score 0 0 0 1 1 0 0  PHQ- 9 Score 0 0 1 2 3 1      Fall Risk    02/11/2022   12:44 PM 01/01/2022    3:51 PM 11/27/2021    3:17 PM 10/09/2021    2:12 PM 02/06/2021    4:22 PM  Fall Risk   Falls in the past year? 0 0 0 0 0  Number falls in past yr: 0 0 0 0 0  Injury with Fall? 0 0 0 0 0  Risk for fall due to :   No Fall Risks    Follow up Falls evaluation completed        FALL RISK PREVENTION PERTAINING TO THE HOME:  Any stairs in or around the home?  If so, are there any without handrails?  Home free of loose throw rugs in walkways, pet beds, electrical cords, etc?  Adequate lighting in your home to reduce risk of falls?   ASSISTIVE DEVICES UTILIZED TO PREVENT FALLS:  Life alert?  Use of a cane, walker or w/c? Grab bars in the bathroom? Shower chair or bench in shower?  Elevated toilet seat or a handicapped toilet?   TIMED UP AND GO:  Was the test performed? No . Audio Visit  Cognitive Function:        Immunizations Immunization History  Administered Date(s) Administered   Pneumococcal Polysaccharide-23 05/26/2019            Qualifies for Shingles Vaccine?   Screening Tests Health Maintenance  Topic Date Due   COVID-19 Vaccine (1) Never done   DTaP/Tdap/Td (1 - Tdap) Never done   Zoster Vaccines-  Shingrix (1 of 2) Never done   Pneumonia Vaccine 2+ Years old (2 of 2 - PCV) 05/25/2020   INFLUENZA VACCINE  10/24/2022   Medicare Annual Wellness (AWV)  07/02/2023   HPV VACCINES  Aged Out    Health Maintenance  Health Maintenance Due  Topic Date Due   COVID-19 Vaccine (1) Never done   DTaP/Tdap/Td (1 - Tdap) Never done   Zoster Vaccines- Shingrix (1 of 2) Never done   Pneumonia Vaccine 46+ Years old (2 of 2 - PCV) 05/25/2020      Lung Cancer Screening: (Low Dose CT Chest recommended if Age 33-80 years, 30 pack-year currently smoking OR have quit w/in 15years.) qualify.   Lung Cancer Screening Referral:   Additional Screening:  Hepatitis C Screening: qualify; Completed   Vision Screening: Recommended annual ophthalmology exams for early detection of glaucoma and other disorders of the eye. Is the patient up to date with their annual eye exam?  Yes  Who is the provider or what is the name of the office in which the patient attends annual eye exams? Dr Franchot Heidelberg If pt is not established with a provider, would they like to be referred to a provider to establish care? No .   Dental Screening: Recommended annual dental exams for proper oral hygiene  Community Resource Referral / Chronic Care Management:  CRR required this visit?  No   CCM required this visit?  No      Plan:     I have personally reviewed and noted the following in the patient's chart:   Medical and social history Use of alcohol, tobacco or illicit drugs  Current medications and supplements including opioid prescriptions.  Functional ability and status Nutritional status Physical activity Advanced directives List of other physicians Hospitalizations, surgeries, and ER visits in previous 12 months Vitals Screenings to include cognitive, depression, and falls Referrals and appointments  In addition, I have reviewed and discussed with patient certain preventive protocols, quality metrics, and best  practice recommendations. A written personalized care plan for preventive services as well as general preventive health recommendations were provided to patient.     Tillie Rung, LPN   03/25/9145   Nurse Notes: Patient decided not to complete this visit   This encounter was created in error - please disregard.

## 2022-07-02 NOTE — Patient Instructions (Signed)
[name] , Thank you for taking time to come for your Medicare Wellness Visit. I appreciate your ongoing commitment to your health goals. Please review the following plan we discussed and let me know if I can assist you in the future.   These are the goals we discussed:  Goals   None     This is a list of the screening recommended for you and due dates:  Health Maintenance  Topic Date Due   COVID-19 Vaccine (1) Never done   DTaP/Tdap/Td vaccine (1 - Tdap) Never done   Zoster (Shingles) Vaccine (1 of 2) Never done   Pneumonia Vaccine (2 of 2 - PCV) 05/25/2020   Flu Shot  10/24/2022   Medicare Annual Wellness Visit  07/02/2023   HPV Vaccine  Aged Out    Advanced directives:   Conditions/risks identified:   Next appointment: Follow up in one year for your annual wellness visit.    Preventive Care 39 Years and Older, Male  Preventive care refers to lifestyle choices and visits with your health care provider that can promote health and wellness. What does preventive care include? A yearly physical exam. This is also called an annual well check. Dental exams once or twice a year. Routine eye exams. Ask your health care provider how often you should have your eyes checked. Personal lifestyle choices, including: Daily care of your teeth and gums. Regular physical activity. Eating a healthy diet. Avoiding tobacco and drug use. Limiting alcohol use. Practicing safe sex. Taking low doses of aspirin every day. Taking vitamin and mineral supplements as recommended by your health care provider. What happens during an annual well check? The services and screenings done by your health care provider during your annual well check will depend on your age, overall health, lifestyle risk factors, and family history of disease. Counseling  Your health care provider may ask you questions about your: Alcohol use. Tobacco use. Drug use. Emotional well-being. Home and relationship  well-being. Sexual activity. Eating habits. History of falls. Memory and ability to understand (cognition). Work and work Astronomer. Screening  You may have the following tests or measurements: Height, weight, and BMI. Blood pressure. Lipid and cholesterol levels. These may be checked every 5 years, or more frequently if you are over 35 years old. Skin check. Lung cancer screening. You may have this screening every year starting at age 67 if you have a 30-pack-year history of smoking and currently smoke or have quit within the past 15 years. Fecal occult blood test (FOBT) of the stool. You may have this test every year starting at age 61. Flexible sigmoidoscopy or colonoscopy. You may have a sigmoidoscopy every 5 years or a colonoscopy every 10 years starting at age 9. Prostate cancer screening. Recommendations will vary depending on your family history and other risks. Hepatitis C blood test. Hepatitis B blood test. Sexually transmitted disease (STD) testing. Diabetes screening. This is done by checking your blood sugar (glucose) after you have not eaten for a while (fasting). You may have this done every 1-3 years. Abdominal aortic aneurysm (AAA) screening. You may need this if you are a current or former smoker. Osteoporosis. You may be screened starting at age 80 if you are at high risk. Talk with your health care provider about your test results, treatment options, and if necessary, the need for more tests. Vaccines  Your health care provider may recommend certain vaccines, such as: Influenza vaccine. This is recommended every year. Tetanus, diphtheria, and acellular  pertussis (Tdap, Td) vaccine. You may need a Td booster every 10 years. Zoster vaccine. You may need this after age 83. Pneumococcal 13-valent conjugate (PCV13) vaccine. One dose is recommended after age 83. Pneumococcal polysaccharide (PPSV23) vaccine. One dose is recommended after age 83. Talk to your health care  provider about which screenings and vaccines you need and how often you need them. This information is not intended to replace advice given to you by your health care provider. Make sure you discuss any questions you have with your health care provider. Document Released: 04/07/2015 Document Revised: 11/29/2015 Document Reviewed: 01/10/2015 Elsevier Interactive Patient Education  2017 [employer]  Fall Prevention in the Home Falls can cause injuries. They can happen to people of all ages. There are many things you can do to make your home safe and to help prevent falls. What can I do on the outside of my home? Regularly fix the edges of walkways and driveways and fix any cracks. Remove anything that might make you trip as you walk through a door, such as a raised step or threshold. Trim any bushes or trees on the path to your home. Use bright outdoor lighting. Clear any walking paths of anything that might make someone trip, such as rocks or tools. Regularly check to see if handrails are loose or broken. Make sure that both sides of any steps have handrails. Any raised decks and porches should have guardrails on the edges. Have any leaves, snow, or ice cleared regularly. Use sand or salt on walking paths during winter. Clean up any spills in your garage right away. This includes oil or grease spills. What can I do in the bathroom? Use night lights. Install grab bars by the toilet and in the tub and shower. Do not use towel bars as grab bars. Use non-skid mats or decals in the tub or shower. If you need to sit down in the shower, use a plastic, non-slip stool. Keep the floor dry. Clean up any water that spills on the floor as soon as it happens. Remove soap buildup in the tub or shower regularly. Attach bath mats securely with double-sided non-slip rug tape. Do not have throw rugs and other things on the floor that can make you trip. What can I do in the bedroom? Use night lights. Make  sure that you have a light by your bed that is easy to reach. Do not use any sheets or blankets that are too big for your bed. They should not hang down onto the floor. Have a firm chair that has side arms. You can use this for support while you get dressed. Do not have throw rugs and other things on the floor that can make you trip. What can I do in the kitchen? Clean up any spills right away. Avoid walking on wet floors. Keep items that you use a lot in easy-to-reach places. If you need to reach something above you, use a strong step stool that has a grab bar. Keep electrical cords out of the way. Do not use floor polish or wax that makes floors slippery. If you must use wax, use non-skid floor wax. Do not have throw rugs and other things on the floor that can make you trip. What can I do with my stairs? Do not leave any items on the stairs. Make sure that there are handrails on both sides of the stairs and use them. Fix handrails that are broken or loose. Make sure that handrails  are as long as the stairways. Check any carpeting to make sure that it is firmly attached to the stairs. Fix any carpet that is loose or worn. Avoid having throw rugs at the top or bottom of the stairs. If you do have throw rugs, attach them to the floor with carpet tape. Make sure that you have a light switch at the top of the stairs and the bottom of the stairs. If you do not have them, ask someone to add them for you. What else can I do to help prevent falls? Wear shoes that: Do not have high heels. Have rubber bottoms. Are comfortable and fit you well. Are closed at the toe. Do not wear sandals. If you use a stepladder: Make sure that it is fully opened. Do not climb a closed stepladder. Make sure that both sides of the stepladder are locked into place. Ask someone to hold it for you, if possible. Clearly mark and make sure that you can see: Any grab bars or handrails. First and last steps. Where the  edge of each step is. Use tools that help you move around (mobility aids) if they are needed. These include: Canes. Walkers. Scooters. Crutches. Turn on the lights when you go into a dark area. Replace any light bulbs as soon as they burn out. Set up your furniture so you have a clear path. Avoid moving your furniture around. If any of your floors are uneven, fix them. If there are any pets around you, be aware of where they are. Review your medicines with your doctor. Some medicines can make you feel dizzy. This can increase your chance of falling. Ask your doctor what other things that you can do to help prevent falls. This information is not intended to replace advice given to you by your health care provider. Make sure you discuss any questions you have with your health care provider. Document Released: 01/05/2009 Document Revised: 08/17/2015 Document Reviewed: 04/15/2014 Elsevier Interactive Patient Education  2017 [employer].

## 2022-07-26 ENCOUNTER — Other Ambulatory Visit: Payer: Self-pay | Admitting: Student

## 2022-07-26 DIAGNOSIS — I10 Essential (primary) hypertension: Secondary | ICD-10-CM

## 2022-07-27 ENCOUNTER — Other Ambulatory Visit: Payer: Self-pay | Admitting: Student

## 2022-08-27 ENCOUNTER — Ambulatory Visit: Payer: Medicare Other | Admitting: Neurology

## 2022-09-02 ENCOUNTER — Ambulatory Visit: Payer: Medicare Other | Admitting: Neurology

## 2022-09-12 ENCOUNTER — Other Ambulatory Visit: Payer: Self-pay | Admitting: Student

## 2022-09-12 DIAGNOSIS — I10 Essential (primary) hypertension: Secondary | ICD-10-CM

## 2022-09-23 NOTE — Progress Notes (Unsigned)
    SUBJECTIVE:   CHIEF COMPLAINT / HPI: Annual physical  Hypertension-on Toprol 50 mg daily, olmesartan 5 mg daily.  Was previously on hydralazine however this was stopped due to headaches and headaches have improved since then.  Has gotten ambulatory blood pressure monitoring in the past and had slight nocturnal dipping.  History of seizures-seen by neurology 7/23-recommended continuation of Dilantin 300 mg nightly  Does attest to having some itching sensation in his left hand and left foot for a while.  States that is not bothering him right now and has been using Tylenol as needed which controls it.  Denies that it gets worse at nighttime.  Denies bilateral involvement of his feet. Not placing anything topical on this area  Denies any drug, alcohol, smoking   Has been trying to exercise his legs often  He is working on his eating habits sometimes has poor appetite  PERTINENT  PMH / PSH:   OBJECTIVE:   BP (!) 161/71   Pulse 98   Ht 5\' 6"  (1.676 m)   Wt 159 lb 9.6 oz (72.4 kg)   SpO2 100%   BMI 25.76 kg/m   General: Well appearing, NAD, awake, alert, responsive to questions Head: Normocephalic atraumatic, no thyromegaly, moist mucous membranes, some nasal congestion present CV: Regular rate and rhythm no murmurs rubs or gallops Respiratory: Clear to ausculation bilaterally, no wheezes rales or crackles, chest rises symmetrically,  no increased work of breathing Abdomen: Soft, non-tender, non-distended, normoactive bowel sounds  Extremities: Moves upper and lower extremities freely, no edema in LE, right arm prosthesis Neuro: No focal deficits Skin: The, dry cracked skin of left palm of hand and left sole of foot.  No ulcerations present.  ASSESSMENT/PLAN:   Hypertension Blood pressure elevated today on recheck was 161/71.  Patient is adherent to olmesartan 5 mg daily and Toprol for his palpitations. -Increase olmesartan to 10 mg daily -2-week BP follow-up and BMP check -  Comprehensive metabolic panel - TSH Rfx on Abnormal to Free T4 - Lipid Panel - CBC - HgB A1c - olmesartan (BENICAR) 5 MG tablet; Take 2 tablets (10 mg total) by mouth daily.  Dispense: 90 tablet; Refill: 0   Elevated blood sugar Has previously had BMP with blood sugar of 200. - HgB A1c  Tinea Scaly and cracked palms consistent with fungal infection of hand/feet. - Clotrimazole 1 % OINT; Apply 1 application  topically in the morning and at bedtime.  Dispense: 56.7 g; Refill: 0  Nasal congestion - fluticasone (FLONASE) 50 MCG/ACT nasal spray; Place 2 sprays into both nostrils daily.  Dispense: 16 g; Refill: 6   Levin Erp, MD [facility]

## 2022-09-24 ENCOUNTER — Ambulatory Visit (INDEPENDENT_AMBULATORY_CARE_PROVIDER_SITE_OTHER): Payer: Medicare Other | Admitting: Student

## 2022-09-24 ENCOUNTER — Encounter: Payer: Self-pay | Admitting: Student

## 2022-09-24 VITALS — BP 161/71 | HR 98 | Ht 66.0 in | Wt 159.6 lb

## 2022-09-24 DIAGNOSIS — R0981 Nasal congestion: Secondary | ICD-10-CM

## 2022-09-24 DIAGNOSIS — I10 Essential (primary) hypertension: Secondary | ICD-10-CM | POA: Diagnosis not present

## 2022-09-24 DIAGNOSIS — B359 Dermatophytosis, unspecified: Secondary | ICD-10-CM

## 2022-09-24 DIAGNOSIS — R739 Hyperglycemia, unspecified: Secondary | ICD-10-CM

## 2022-09-24 DIAGNOSIS — Z0000 Encounter for general adult medical examination without abnormal findings: Secondary | ICD-10-CM

## 2022-09-24 LAB — POCT GLYCOSYLATED HEMOGLOBIN (HGB A1C): Hemoglobin A1C: 5.6 % (ref 4.0–5.6)

## 2022-09-24 MED ORDER — FLUTICASONE PROPIONATE 50 MCG/ACT NA SUSP
2.0000 | Freq: Every day | NASAL | 6 refills | Status: DC
Start: 2022-09-24 — End: 2024-01-30

## 2022-09-24 MED ORDER — OLMESARTAN MEDOXOMIL 5 MG PO TABS: 10.0000 mg | ORAL_TABLET | Freq: Every day | ORAL | 0 refills | Status: DC

## 2022-09-24 MED ORDER — CLOTRIMAZOLE 1 % EX OINT
1.0000 | TOPICAL_OINTMENT | Freq: Two times a day (BID) | CUTANEOUS | 0 refills | Status: AC
Start: 2022-09-24 — End: ?

## 2022-09-24 NOTE — Assessment & Plan Note (Signed)
Blood pressure elevated today on recheck was 161/71.  Patient is adherent to olmesartan 5 mg daily and Toprol for his palpitations. -Increase olmesartan to 10 mg daily -2-week BP follow-up and BMP check - Comprehensive metabolic panel - TSH Rfx on Abnormal to Free T4 - Lipid Panel - CBC - HgB A1c - olmesartan (BENICAR) 5 MG tablet; Take 2 tablets (10 mg total) by mouth daily.  Dispense: 90 tablet; Refill: 0

## 2022-09-24 NOTE — Patient Instructions (Addendum)
It was great to see you! Thank you for allowing me to participate in your care!   I recommend that you always bring your medications to each appointment as this makes it easy to ensure we are on the correct medications and helps [place] not miss when refills are needed.  Our plans for today:  - I am prescribing an antifungal to put on your hand and feet to help with burning sensation-do this twice a day - I placed refills for your meds- I am increasing your olmesartan to 10 mg daily (take two 5 mg talets), please return in 2 weeks for repeat BP check and labs  We are checking some labs today, I will call you if they are abnormal will send you a MyChart message or a letter if they are normal.  If you do not hear about your labs in the next 2 weeks please let us know.  Take care and seek immediate care sooner if you develop any concerns. Please remember to show up 15 minutes before your scheduled appointment time!  Levin Erp, MD [facility]

## 2022-09-25 LAB — TSH RFX ON ABNORMAL TO FREE T4: TSH: 0.314 u[IU]/mL — ABNORMAL LOW (ref 0.450–4.500)

## 2022-09-25 LAB — COMPREHENSIVE METABOLIC PANEL
ALT: 17 IU/L (ref 0–44)
AST: 21 IU/L (ref 0–40)
Albumin: 4.2 g/dL (ref 3.7–4.7)
Alkaline Phosphatase: 138 IU/L — ABNORMAL HIGH (ref 44–121)
BUN/Creatinine Ratio: 8 — ABNORMAL LOW (ref 10–24)
BUN: 8 mg/dL (ref 8–27)
Bilirubin Total: 0.3 mg/dL (ref 0.0–1.2)
CO2: 22 mmol/L (ref 20–29)
Calcium: 10.2 mg/dL (ref 8.6–10.2)
Chloride: 103 mmol/L (ref 96–106)
Creatinine, Ser: 1.01 mg/dL (ref 0.76–1.27)
Globulin, Total: 3.6 g/dL (ref 1.5–4.5)
Glucose: 105 mg/dL — ABNORMAL HIGH (ref 70–99)
Potassium: 4.5 mmol/L (ref 3.5–5.2)
Sodium: 139 mmol/L (ref 134–144)
Total Protein: 7.8 g/dL (ref 6.0–8.5)
eGFR: 74 mL/min/{1.73_m2} (ref >59)

## 2022-09-25 LAB — T4F: T4,Free (Direct): 1.34 ng/dL (ref 0.82–1.77)

## 2022-09-25 LAB — LIPID PANEL
Chol/HDL Ratio: 3.2 ratio (ref 0.0–5.0)
Cholesterol, Total: 148 mg/dL (ref 100–199)
HDL: 46 mg/dL (ref >39)
LDL Chol Calc (NIH): 84 mg/dL (ref 0–99)
Triglycerides: 96 mg/dL (ref 0–149)
VLDL Cholesterol Cal: 18 mg/dL (ref 5–40)

## 2022-09-25 LAB — CBC
Hematocrit: 40.3 % (ref 37.5–51.0)
Hemoglobin: 13.8 g/dL (ref 13.0–17.7)
MCH: 31.9 pg (ref 26.6–33.0)
MCHC: 34.2 g/dL (ref 31.5–35.7)
MCV: 93 fL (ref 79–97)
Platelets: 293 10*3/uL (ref 150–450)
RBC: 4.32 x10E6/uL (ref 4.14–5.80)
RDW: 12.1 % (ref 11.6–15.4)
WBC: 5.9 10*3/uL (ref 3.4–10.8)

## 2022-09-27 ENCOUNTER — Telehealth: Payer: Self-pay | Admitting: Student

## 2022-09-27 NOTE — Telephone Encounter (Signed)
Called patient and confirmed date of birth.  Lab results overall within normal limits.  Continues to have somewhat increased alkaline phosphatase at 138 although it is decreasing.  He also has a low TSH with a normal T4, discussed considering a T3 test at his next visit.  A1c within normal limits.  We discussed that he should come in for a follow-up visit as he says that he is having some issues urinating.  He will call his daughter to help schedule an appointment at a convenient time for her as well.

## 2022-10-21 ENCOUNTER — Other Ambulatory Visit: Payer: Self-pay | Admitting: Student

## 2022-10-25 ENCOUNTER — Other Ambulatory Visit: Payer: Self-pay | Admitting: Student

## 2022-10-28 NOTE — Progress Notes (Unsigned)
    SUBJECTIVE:   CHIEF COMPLAINT / HPI: Urinary issues, blood pressure follow-up  Urinary issues Problems urinating and sometimes leakage for the past month. Sometimes incontinent of small amounts of urine. Before had a goods tream and then stop and sometimes stop. No pain when urinating. Feels like can't empty baldder. For last month or so. No numbness. No pain when urinating. Weak stream.  Hypertension: Patient is a 82 y.o. male who present today for follow up of hypertension.   Patient endorses no problems  Home medications include: Increased from 5 mg to 10 mg olmesartan at last visit, Toprol for palpitations Patient endorses taking these medications as prescribed. Denies any headache, vision changes, shortness of breath, lower extremity swelling or chest pain   Most recent creatinine trend:  Lab Results  Component Value Date   CREATININE 1.01 09/24/2022   CREATININE 0.93 10/09/2021   CREATININE 0.92 03/28/2021   Patient does not check blood pressure at home.  Patient has had a BMP in the past 1 year.  Thyroid function test abnormality Low TSH with normal T4 at last visit rechecking T3 today Feels like heart racing/intermittent palpitations sometimes-metoprolol helps Had heart monitor in 2015 with NSR and PVCs-declines repeat heart monitor today  PERTINENT  PMH / PSH: seizure disorder  OBJECTIVE:   BP (!) 165/80   Pulse 85   Ht 5\' 6"  (1.676 m)   Wt 159 lb 9.6 oz (72.4 kg)   SpO2 99%   BMI 25.76 kg/m   General: Well appearing, NAD, awake, alert, responsive to questions Head: Normocephalic atraumatic CV: Regular rate and rhythm Respiratory: chest rises symmetrically,  no increased work of breathing Abdomen: Soft, non-tender, non-distended, normoactive bowel sounds --- bedside POCUS of bladder with 63 and 34 mL present Extremities: Moves upper and lower extremities freely, no edema in LE  ASSESSMENT/PLAN:   Hypertension Patient's blood pressure is not  controlled today. BP: (!) 165/80. Goal of 140/90. Patient's medication regimen includes olmesartan 10 mg. -Changes to current regimen include will await BMP, if stable will increase to 20 mg olmesartan daily -Follow up in 4 weeks   BPH (benign prostatic hyperplasia) Known BPH with trilobar prostatic hypertrophy.  We discussed the consideration of PSA as well and the possibility of prostate cancer.  For now patient will await her visit in a month to decide whether he would like the PSA to be checked.  We will start Flomax for him and see if this improves any of his symptoms. -Flomax 0.4 mg daily -Consider PSA/urology referral at next visit patient preference  Abnormal thyroid function test History of low TSH, normal T4.  Will check a T3 and TRAb today.  Discussed with patient and will go ahead and refer to endocrine given patient is symptomatic with palpitations. - TSH Rfx on Abnormal to Free T4 - T3, free - TRAb (TSH Receptor Binding Antibody) - Ambulatory referral to Endocrinology   Levin Erp, MD [facility]

## 2022-10-31 ENCOUNTER — Other Ambulatory Visit: Payer: Self-pay | Admitting: Student

## 2022-10-31 ENCOUNTER — Encounter: Payer: Self-pay | Admitting: Student

## 2022-10-31 ENCOUNTER — Ambulatory Visit: Payer: Medicare Other | Admitting: Student

## 2022-10-31 ENCOUNTER — Other Ambulatory Visit: Payer: Self-pay

## 2022-10-31 ENCOUNTER — Other Ambulatory Visit: Payer: Self-pay | Admitting: Family Medicine

## 2022-10-31 VITALS — BP 165/80 | HR 85 | Ht 66.0 in | Wt 159.6 lb

## 2022-10-31 DIAGNOSIS — Q649 Congenital malformation of urinary system, unspecified: Secondary | ICD-10-CM

## 2022-10-31 DIAGNOSIS — N401 Enlarged prostate with lower urinary tract symptoms: Secondary | ICD-10-CM

## 2022-10-31 DIAGNOSIS — I10 Essential (primary) hypertension: Secondary | ICD-10-CM

## 2022-10-31 DIAGNOSIS — N3943 Post-void dribbling: Secondary | ICD-10-CM

## 2022-10-31 DIAGNOSIS — R946 Abnormal results of thyroid function studies: Secondary | ICD-10-CM | POA: Diagnosis not present

## 2022-10-31 MED ORDER — TAMSULOSIN HCL 0.4 MG PO CAPS
0.4000 mg | ORAL_CAPSULE | Freq: Every day | ORAL | 3 refills | Status: DC
Start: 2022-10-31 — End: 2022-12-10

## 2022-10-31 MED ORDER — SILDENAFIL CITRATE 100 MG PO TABS: 50.0000 mg | ORAL_TABLET | Freq: Every day | ORAL | 11 refills | Status: DC | PRN

## 2022-10-31 NOTE — Assessment & Plan Note (Signed)
History of low TSH, normal T4.  Will check a T3 and TRAb today.  Discussed with patient and will go ahead and refer to endocrine given patient is symptomatic with palpitations. - TSH Rfx on Abnormal to Free T4 - T3, free - TRAb (TSH Receptor Binding Antibody) - Ambulatory referral to Endocrinology

## 2022-10-31 NOTE — Assessment & Plan Note (Signed)
Known BPH with trilobar prostatic hypertrophy.  We discussed the consideration of PSA as well and the possibility of prostate cancer.  For now patient will await her visit in a month to decide whether he would like the PSA to be checked.  We will start Flomax for him and see if this improves any of his symptoms. -Flomax 0.4 mg daily -Consider PSA/urology referral at next visit patient preference

## 2022-10-31 NOTE — Assessment & Plan Note (Signed)
Patient's blood pressure is not controlled today. BP: (!) 165/80. Goal of 140/90. Patient's medication regimen includes olmesartan 10 mg. -Changes to current regimen include will await BMP, if stable will increase to 20 mg olmesartan daily -Follow up in 4 weeks

## 2022-10-31 NOTE — Patient Instructions (Addendum)
It was great to see you! Thank you for allowing me to participate in your care!   I recommend that you always bring your medications to each appointment as this makes it easy to ensure we are on the correct medications and helps [place] not miss when refills are needed.  Our plans for today:  -We recheck your labs today and if this is okay then I would like to increase your olmesartan -I want to see you back in about 1 month to see if this improves -Let's start flomax to help with urination -refilled your medications  We are checking some labs today, I will call you if they are abnormal will send you a MyChart message or a letter if they are normal.  If you do not hear about your labs in the next 2 weeks please let us know.  Take care and seek immediate care sooner if you develop any concerns. Please remember to show up 15 minutes before your scheduled appointment time!  Levin Erp, MD [facility]

## 2022-11-01 ENCOUNTER — Telehealth: Payer: Self-pay | Admitting: Student

## 2022-11-01 ENCOUNTER — Telehealth: Payer: Self-pay

## 2022-11-01 DIAGNOSIS — I10 Essential (primary) hypertension: Secondary | ICD-10-CM

## 2022-11-01 MED ORDER — OLMESARTAN MEDOXOMIL 20 MG PO TABS: 20.0000 mg | ORAL_TABLET | Freq: Every day | ORAL | 0 refills | Status: DC

## 2022-11-01 MED ORDER — SILDENAFIL CITRATE 100 MG PO TABS: 50.0000 mg | ORAL_TABLET | Freq: Every day | ORAL | 1 refills | Status: DC | PRN

## 2022-11-01 NOTE — Telephone Encounter (Addendum)
Called patient and confirmed DOB. Discussed labs overall normal-will send in increased dose of Olmesartan 20 mg daily. Patient amenable to this. Will send message to [facility] to schedule 2 week follow up for blood pressure and repeat BMP. He is now interested in PSA screening. He also states he wanted the viagra sent to [facility].

## 2022-11-01 NOTE — Telephone Encounter (Signed)
Called the patient to change his appointment. I scheduled him for 08/26 @910  that was the closes date I could get.

## 2022-11-01 NOTE — Telephone Encounter (Signed)
-----   Message from [facility] sent at 11/01/2022 10:02 AM EDT ----- Can you switch patients one month visit to 2 week visit with me for BP check and repeat labs? Thanks!!

## 2022-11-15 ENCOUNTER — Other Ambulatory Visit: Payer: Self-pay

## 2022-11-15 NOTE — Progress Notes (Addendum)
   Wafi Simoes 05/07/1940 [id number]  Patient outreached by Mack Guise on 11/15/2022  Blood Pressure Readings: Last documented ambulatory systolic blood pressure: 165 Last documented ambulatory diastolic blood pressure: 80 Does the patient have a validated home blood pressure machine?: Yes They report home readings NA   Medication review was performed. Is the patient taking their medications as prescribed?: Yes Differences from their prescribed list include: NA  The following barriers to adherence were noted: Does the patient have cost concerns?: No Does the patient have transportation concerns?: No Does the patient need assistance obtaining refills?: No Does the patient occassionally forget to take some of their prescribed medications?: No Does the patient feel like one/some of their medications make them feel poorly?: No Does the patient have questions or concerns about their medications?: No Does the patient have a follow up scheduled with their primary care provider/cardiologist?: Yes   Interventions: Interventions Completed: Medications were reviewed, Patient was educated on proper technique to check home blood pressure and reminded to bring home machine and readings to next provider appointment  The patient has follow up scheduled:  PCP: Levin Erp, MD   Mack Guise, Student-PharmD

## 2022-11-18 ENCOUNTER — Encounter: Payer: Self-pay | Admitting: Student

## 2022-11-18 ENCOUNTER — Ambulatory Visit (INDEPENDENT_AMBULATORY_CARE_PROVIDER_SITE_OTHER): Payer: Medicare Other | Admitting: Student

## 2022-11-18 VITALS — BP 161/66 | HR 102 | Ht 66.0 in | Wt 159.4 lb

## 2022-11-18 DIAGNOSIS — R351 Nocturia: Secondary | ICD-10-CM | POA: Diagnosis not present

## 2022-11-18 DIAGNOSIS — N401 Enlarged prostate with lower urinary tract symptoms: Secondary | ICD-10-CM

## 2022-11-18 DIAGNOSIS — I10 Essential (primary) hypertension: Secondary | ICD-10-CM

## 2022-11-18 MED ORDER — OLMESARTAN MEDOXOMIL-HCTZ 20-12.5 MG PO TABS
1.0000 | ORAL_TABLET | Freq: Every day | ORAL | 0 refills | Status: DC
Start: 2022-11-18 — End: 2022-12-10

## 2022-11-18 NOTE — Patient Instructions (Signed)
It was great to see you! Thank you for allowing me to participate in your care!   I recommend that you always bring your medications to each appointment as this makes it easy to ensure we are on the correct medications and helps [place] not miss when refills are needed.  Our plans for today:  -I am going to add on hydrochlorothiazide to your olmesartan it will be 1 pill which she takes daily.  It is called Benicar.  I would like you to follow-up with Dr. Raymondo Band in a few few weeks so that we can recheck your blood pressure and see if you need to have this adjusted again  We are checking some labs today, I will call you if they are abnormal will send you a MyChart message or a letter if they are normal.  If you do not hear about your labs in the next 2 weeks please let us know.  Take care and seek immediate care sooner if you develop any concerns. Please remember to show up 15 minutes before your scheduled appointment time!  Levin Erp, MD [facility]

## 2022-11-18 NOTE — Assessment & Plan Note (Signed)
Patient's blood pressure is not controlled today. BP: (!) 161/66. Goal of 140/90. Patient's medication regimen includes olmesartan 20 mg daily. -Changes to current regimen include we will change this medication to olmesartan 20-HCTZ 12.5 mg daily -Referral to pharmacy clinic for blood pressure recheck and medication adjustment -Labs: BMP -Follow up in 2-3 weeks for repeat labs and medication adjustment with pharmacy clinic

## 2022-11-18 NOTE — Progress Notes (Signed)
    SUBJECTIVE:   CHIEF COMPLAINT / HPI: Hypertension  Hypertension: Patient is a 82 y.o. male who present today for follow up of hypertension.   Patient endorses no problems  Home medications include: At last visit increased regimen to olmesartan 20 mg daily Patient endorses taking these medications as prescribed.  Most recent creatinine trend:  Lab Results  Component Value Date   CREATININE 1.10 10/31/2022   CREATININE 1.01 09/24/2022   CREATININE 0.93 10/09/2021   Patient does not check blood pressure at home.  Patient has had a BMP in the past 1 year.  BPH Started flomax at last visit, today feels much improved.  On phone discussed that he would like PSA screening but today he says that he would like to decline it.  States that if he does have prostate cancer he would want to do treatment in general like radiation.   PERTINENT  PMH / PSH: reviewed  OBJECTIVE:   BP (!) 161/66   Pulse (!) 102   Ht 5\' 6"  (1.676 m)   Wt 159 lb 6.4 oz (72.3 kg)   SpO2 97%   BMI 25.73 kg/m   General: Well appearing, NAD, awake, alert, responsive to questions Head: Normocephalic atraumatic Respiratory: chest rises symmetrically,  no increased work of breathing  ASSESSMENT/PLAN:   Hypertension Patient's blood pressure is not controlled today. BP: (!) 161/66. Goal of 140/90. Patient's medication regimen includes olmesartan 20 mg daily. -Changes to current regimen include we will change this medication to olmesartan 20-HCTZ 12.5 mg daily -Referral to pharmacy clinic for blood pressure recheck and medication adjustment -Labs: BMP -Follow up in 2-3 weeks for repeat labs and medication adjustment with pharmacy clinic  BPH (benign prostatic hyperplasia) Patient seems to be improving a lot on Flomax.  He declines PSA testing today. -Monitor symptoms and titrate up as needed   Levin Erp, MD [facility]

## 2022-11-18 NOTE — Assessment & Plan Note (Signed)
Patient seems to be improving a lot on Flomax.  He declines PSA testing today. -Monitor symptoms and titrate up as needed

## 2022-11-19 LAB — BASIC METABOLIC PANEL
BUN/Creatinine Ratio: 12 (ref 10–24)
BUN: 13 mg/dL (ref 8–27)
CO2: 20 mmol/L (ref 20–29)
Calcium: 10.1 mg/dL (ref 8.6–10.2)
Chloride: 101 mmol/L (ref 96–106)
Creatinine, Ser: 1.09 mg/dL (ref 0.76–1.27)
Glucose: 116 mg/dL — ABNORMAL HIGH (ref 70–99)
Potassium: 4.3 mmol/L (ref 3.5–5.2)
Sodium: 138 mmol/L (ref 134–144)
eGFR: 67 mL/min/{1.73_m2} (ref >59)

## 2022-11-20 ENCOUNTER — Encounter: Payer: Self-pay | Admitting: Student

## 2022-11-23 ENCOUNTER — Other Ambulatory Visit: Payer: Self-pay | Admitting: Student

## 2022-11-29 ENCOUNTER — Ambulatory Visit: Payer: Medicare Other | Admitting: Student

## 2022-12-09 NOTE — Progress Notes (Signed)
    SUBJECTIVE:   CHIEF COMPLAINT / HPI: Blood pressure follow-up  Hypertension: Patient is a 82 y.o. male who present today for follow up of hypertension.   Patient endorses no problems-no dizziness or lightheadedness  Home medications include: At last visit was switched to olmesartan 20-HCTZ 12.5 mg from olmesartan 20 mg daily Patient endorses taking these medications as prescribed.  Most recent creatinine trend:  Lab Results  Component Value Date   CREATININE 1.09 11/18/2022   CREATININE 1.10 10/31/2022   CREATININE 1.01 09/24/2022   Patient does not check blood pressure at home.  Left ear tenderness Patient says for the last 1-2 months he has been having left pinna ear tenderness.  Denies any issues with hearing.  Denies any fevers, weight loss, ringing in the ear.  Has not been putting anything on it.  Has not smoked for over 50 years.  PERTINENT  PMH / PSH: Reviewed  OBJECTIVE:   BP 124/65   Pulse 92   Ht 5\' 6"  (1.676 m)   Wt 157 lb 6.4 oz (71.4 kg)   SpO2 99%   BMI 25.41 kg/m   General: Well appearing, NAD, awake, alert, responsive to questions Head: Normocephalic atraumatic; left pinna with tiny white papules, nontender to palpation; TM and canal clear bilaterally; no cervical or postauricular adenopathy Respiratory: chest rises symmetrically,  no increased work of breathing  ASSESSMENT/PLAN:   Hypertension Patient's blood pressure is controlled today. BP: 124/65. Goal of  SBP <130 . Patient's medication regimen includes olmesartan 20-HCTZ 12.5 mg. -Changes to current regimen include none -Labs: BMP -Follow up in 3 months   Skin lesion Small white papules on left pinna of ear.  Mildly tender.  Does not appear to be acutely infected.  Will treat with topical mupirocin for 5 days.  If not improved can consider dermatology referral as well given prolonged period of time that has been going on for. - mupirocin ointment (BACTROBAN) 2 %; Apply 1 Application  topically 2 (two) times daily for 5 days.  Dispense: 10 g; Refill: 0   Levin Erp, MD [facility]

## 2022-12-10 ENCOUNTER — Ambulatory Visit (INDEPENDENT_AMBULATORY_CARE_PROVIDER_SITE_OTHER): Payer: Medicare Other | Admitting: Student

## 2022-12-10 ENCOUNTER — Encounter: Payer: Self-pay | Admitting: Student

## 2022-12-10 VITALS — BP 124/65 | HR 92 | Ht 66.0 in | Wt 157.4 lb

## 2022-12-10 DIAGNOSIS — Q649 Congenital malformation of urinary system, unspecified: Secondary | ICD-10-CM | POA: Diagnosis not present

## 2022-12-10 DIAGNOSIS — I10 Essential (primary) hypertension: Secondary | ICD-10-CM | POA: Diagnosis not present

## 2022-12-10 DIAGNOSIS — L989 Disorder of the skin and subcutaneous tissue, unspecified: Secondary | ICD-10-CM

## 2022-12-10 MED ORDER — MUPIROCIN 2 % EX OINT: 1.0000 | TOPICAL_OINTMENT | Freq: Two times a day (BID) | CUTANEOUS | 0 refills | Status: AC

## 2022-12-10 MED ORDER — VALACYCLOVIR HCL 500 MG PO TABS: 500.0000 mg | ORAL_TABLET | Freq: Every day | ORAL | 0 refills | Status: DC

## 2022-12-10 MED ORDER — OLMESARTAN MEDOXOMIL-HCTZ 20-12.5 MG PO TABS
1.0000 | ORAL_TABLET | Freq: Every day | ORAL | 0 refills | Status: DC
Start: 2022-12-10 — End: 2023-01-07

## 2022-12-10 MED ORDER — METOPROLOL SUCCINATE ER 50 MG PO TB24: 50.0000 mg | ORAL_TABLET | Freq: Every day | ORAL | 0 refills | Status: DC

## 2022-12-10 MED ORDER — SILDENAFIL CITRATE 100 MG PO TABS: 50.0000 mg | ORAL_TABLET | Freq: Every day | ORAL | 0 refills | Status: DC | PRN

## 2022-12-10 MED ORDER — PHENYTOIN SODIUM EXTENDED 100 MG PO CAPS: 300.0000 mg | ORAL_CAPSULE | Freq: Every day | ORAL | 0 refills | Status: DC

## 2022-12-10 MED ORDER — TAMSULOSIN HCL 0.4 MG PO CAPS
0.4000 mg | ORAL_CAPSULE | Freq: Every day | ORAL | 1 refills | Status: DC
Start: 2022-12-10 — End: 2023-01-29

## 2022-12-10 NOTE — Patient Instructions (Signed)
It was great to see you! Thank you for allowing me to participate in your care!   Our plans for today:  -Blood pressure looks great-I will get 1 additional labs today -For your ear and when I prescribe something called mupirocin to take twice a day for 5 days-if it does not improve after this we could consider a dermatology referral for them to take a look at it to  Take care and seek immediate care sooner if you develop any concerns.  Levin Erp, MD

## 2022-12-10 NOTE — Assessment & Plan Note (Signed)
Patient's blood pressure is controlled today. BP: 124/65. Goal of  SBP <130 . Patient's medication regimen includes olmesartan 20-HCTZ 12.5 mg. -Changes to current regimen include none -Labs: BMP -Follow up in 3 months

## 2022-12-11 ENCOUNTER — Encounter: Payer: Self-pay | Admitting: Student

## 2022-12-23 ENCOUNTER — Telehealth: Payer: Self-pay

## 2022-12-23 NOTE — Telephone Encounter (Signed)
Patients daughter calls nurse line in regards to blood pressure medication.   She report her father would like to go back Hydralazine. She reports he has had a bad taste in his mouth since starting Olmesartan-hydrochlorothiazide.  He denies any additional side effects.  Advised with forward to PCP.

## 2022-12-24 NOTE — Telephone Encounter (Signed)
Spoke with patient. He states that the new medication OLMESARTAN-HYDROCHLOROTHIAZIDE makes him sick,  and he is not going to take that medication at all. He prefers to be on HYDRALAZINE cause he stated that he did just fine on that medication and has been on that for at least 25 years. Aquilla Solian, CMA

## 2023-01-02 NOTE — Telephone Encounter (Signed)
Patient's daughter returns call to nurse line. Advised of message per Dr. Laroy Apple.   She reports that father does not want to continue olmesartan-hydrochlorothiazide.   Recommended scheduling office visit to discuss concern further.   Scheduled for next Tuesday, 10/15 at 1010.  Veronda Prude, RN

## 2023-01-03 ENCOUNTER — Telehealth: Payer: Self-pay | Admitting: Student

## 2023-01-03 NOTE — Telephone Encounter (Signed)
Has bad taste in mouth after taking medicine (combined olmesartan and hydrochlorothiazide) -- now went back to taking olmesartan 20 mg daily. BP was 125/80 this AM. Discussed continuing olmesartan 20 mg daily until we see each other on 10/15. Patient amenable to this and appreciated call.

## 2023-01-06 NOTE — Progress Notes (Unsigned)
    SUBJECTIVE:   CHIEF COMPLAINT / HPI:   Hypertension: Patient is a 82 y.o. male who present today for follow up of hypertension.   Patient endorses {[id number]:24882}  Home medications include: olmesartan 20 mg daily---states the combined pill tasted bad and stopped this (olmesartan-hctz) Patient endorses taking these medications as prescribed.*** Denies any headache, vision changes, shortness of breath, lower extremity swelling or chest pain   Most recent creatinine trend:  Lab Results  Component Value Date   CREATININE 1.08 12/10/2022   CREATININE 1.09 11/18/2022   CREATININE 1.10 10/31/2022   Patient {rwdoesdoesnot:24881} check blood pressure at home.  Patient {HAS HAS IHK:74259} had a BMP in the past 1 year.  PERTINENT  PMH / PSH: ***  OBJECTIVE:   There were no vitals taken for this visit.  ***  ASSESSMENT/PLAN:   No problem-specific Assessment & Plan notes found for this encounter.     Levin Erp, MD [facility]

## 2023-01-07 ENCOUNTER — Ambulatory Visit: Payer: Medicare Other | Admitting: Student

## 2023-01-07 ENCOUNTER — Encounter: Payer: Self-pay | Admitting: Student

## 2023-01-07 VITALS — BP 164/74 | HR 83 | Ht 66.0 in | Wt 161.0 lb

## 2023-01-07 DIAGNOSIS — I10 Essential (primary) hypertension: Secondary | ICD-10-CM

## 2023-01-07 MED ORDER — OLMESARTAN MEDOXOMIL 40 MG PO TABS
40.0000 mg | ORAL_TABLET | Freq: Every day | ORAL | 0 refills | Status: DC
Start: 2023-01-07 — End: 2023-01-23

## 2023-01-07 MED ORDER — LORATADINE 10 MG PO TBDP
10.0000 mg | ORAL_TABLET | Freq: Every day | ORAL | 0 refills | Status: AC
Start: 2023-01-07 — End: ?

## 2023-01-07 MED ORDER — METOPROLOL SUCCINATE ER 50 MG PO TB24: 50.0000 mg | ORAL_TABLET | Freq: Every day | ORAL | 0 refills | Status: DC

## 2023-01-07 MED ORDER — VALACYCLOVIR HCL 500 MG PO TABS: 500.0000 mg | ORAL_TABLET | Freq: Every day | ORAL | 0 refills | Status: DC

## 2023-01-07 NOTE — Assessment & Plan Note (Signed)
Patient was previously controlled with HCTZ and olmesartan but did not like the taste of this.  BP not controlled today. -Increase olmesartan from 20 mg to 40 mg -2-week follow-up for blood pressure recheck and repeat BMP

## 2023-01-07 NOTE — Patient Instructions (Addendum)
It was great to see you! Thank you for allowing me to participate in your care!   Our plans for today:  - I am sending in 40 mg of olmesartan instead of 20 mg - 2 week follow up fro BP recheck and labs - I refilled your valtrex and metoprolol - I am sending in allergy medicine  Take care and seek immediate care sooner if you develop any concerns.  Levin Erp, MD

## 2023-01-17 ENCOUNTER — Telehealth: Payer: Self-pay

## 2023-01-17 MED ORDER — MAGIC MOUTHWASH: 5.0000 mL | Freq: Three times a day (TID) | ORAL | 0 refills | Status: DC | PRN

## 2023-01-17 MED ORDER — LIDOCAINE VISCOUS HCL 2 % MT SOLN: 5.0000 mL | Freq: Three times a day (TID) | OROMUCOSAL | 0 refills | Status: DC | PRN

## 2023-01-17 NOTE — Telephone Encounter (Signed)
Patients daughter calls nurse line requesting magic mouth wash.   She reports he has been complaining of a metallic taste in his mouth for years now.   She denies any sores or bleeding. She reports he believes it is related to one of the chronic medications he is on.   She reports he has had this mouth was in the past and it helped with symptoms.   Magic Mouth Wash was last prescribed in 2018.  Advised with forward to PCP.

## 2023-01-17 NOTE — Telephone Encounter (Signed)
Called and informed the patient that the mouth was sent to the pharmacy. He asked could the taste possibly be lead poisoning from his can sodas he be drinking. I stated that I wasn't for sure but if he still have that taste in his mouth by the time of his next doctors appointment to inform the doctor.

## 2023-01-17 NOTE — Addendum Note (Signed)
Addended by: Levin Erp on: 01/17/2023 02:01 PM   Modules accepted: Orders

## 2023-01-23 ENCOUNTER — Encounter: Payer: Self-pay | Admitting: Student

## 2023-01-23 ENCOUNTER — Other Ambulatory Visit: Payer: Self-pay | Admitting: Student

## 2023-01-23 ENCOUNTER — Ambulatory Visit (INDEPENDENT_AMBULATORY_CARE_PROVIDER_SITE_OTHER): Payer: Medicare Other | Admitting: Student

## 2023-01-23 VITALS — BP 154/65 | HR 94 | Ht 66.0 in | Wt 160.4 lb

## 2023-01-23 DIAGNOSIS — I10 Essential (primary) hypertension: Secondary | ICD-10-CM

## 2023-01-23 DIAGNOSIS — R432 Parageusia: Secondary | ICD-10-CM | POA: Diagnosis not present

## 2023-01-23 MED ORDER — AMLODIPINE-OLMESARTAN 5-40 MG PO TABS: 1.0000 | ORAL_TABLET | Freq: Every day | ORAL | 0 refills | Status: DC

## 2023-01-23 NOTE — Assessment & Plan Note (Addendum)
Patient's blood pressure is not controlled today. BP: (!) 154/65. Goal of SBP <140. Patient's medication regimen includes olmesartan 40 mg. -Changes to current regimen include Amlodipine5-Olmesartan 40 -Follow up in 2-4 weeks  -BMP

## 2023-01-23 NOTE — Progress Notes (Signed)
    SUBJECTIVE:   CHIEF COMPLAINT / HPI:   Hypertension: Patient is a 82 y.o. male who present today for follow up of hypertension.   Patient endorses no problems  Home medications include: Olmesartan 40 mg Patient endorses taking these medications as prescribed. Denies any headache, vision changes, shortness of breath, lower extremity swelling or chest pain   Most recent creatinine trend:  Lab Results  Component Value Date   CREATININE 1.08 12/10/2022   CREATININE 1.09 11/18/2022   CREATININE 1.10 10/31/2022    Dysgeusia Metal taste in mouth for a while for months now that have been making him feel horrible. States he thinks he has lead poisoning because he drinks out of aluminum cans. Magic mouthwash worked for a day but now still has it. Smoked 50 years ago. Mainly when he wakes up is when he has this taste  PERTINENT  PMH / PSH: HTN, seizure disorder  OBJECTIVE:   BP (!) 154/65   Pulse 94   Ht 5\' 6"  (1.676 m)   Wt 160 lb 6 oz (72.7 kg)   SpO2 97%   BMI 25.89 kg/m   General: Well appearing, NAD, awake, alert, responsive to questions Head: Normocephalic atraumatic; mild greenish discoloration of tongue Respiratory:chest rises symmetrically,  no increased work of breathing    ASSESSMENT/PLAN:   Assessment & Plan Dysgeusia Metallic taste in mouth for months.  Unclear cause of this but could be related to vitamin deficiencies versus Dilantin use. - Vitamin B12 - Lead, blood - Phenytoin level, free and total - 1 month f/u - Dental visit recommended Hypertension, unspecified type Patient's blood pressure is not controlled today. BP: (!) 154/65. Goal of  SBP <140 . Patient's medication regimen includes olmesartan 40 mg. -Changes to current regimen include Amlodipine5-Olmesartan 40 -Follow up in 2-4 weeks  -BMP   Levin Erp, MD [facility]

## 2023-01-23 NOTE — Patient Instructions (Signed)
It was great to see you! Thank you for allowing me to participate in your care!   Our plans for today:  -I am sending in new blood pressure medicine to take instead of the 1 that you are taking right now it is a combination of olmesartan that you are taking right now and amlodipine -Going to check some labs to see why you are having metallic taste in your mouth -I recommend scheduling a dental appointment as well  Take care and seek immediate care sooner if you develop any concerns.  Levin Erp, MD

## 2023-01-24 ENCOUNTER — Telehealth: Payer: Self-pay | Admitting: Student

## 2023-01-24 NOTE — Telephone Encounter (Signed)
Called patient and confirmed DOB. Labs overall wnl, awaiting for free phenytoin level. Feels that his dysgeusia was caused by olmesartan. He is amenable to changing this to exforge (valsartan-amlodipine).  I discussed with him also to follow-up with pharmacy clinic and see if there are any other medications or supplements that could be causing him to feel this way.  We we will change his blood pressure medicine today and recheck blood pressure at pharmacy visit on 11/6.  Patient amenable to this.

## 2023-01-27 ENCOUNTER — Other Ambulatory Visit: Payer: Self-pay | Admitting: Student

## 2023-01-27 DIAGNOSIS — I10 Essential (primary) hypertension: Secondary | ICD-10-CM

## 2023-01-29 ENCOUNTER — Encounter: Payer: Self-pay | Admitting: Pharmacist

## 2023-01-29 ENCOUNTER — Ambulatory Visit (INDEPENDENT_AMBULATORY_CARE_PROVIDER_SITE_OTHER): Payer: Medicare Other | Admitting: Pharmacist

## 2023-01-29 VITALS — BP 150/64 | HR 92 | Wt 161.8 lb

## 2023-01-29 DIAGNOSIS — N401 Enlarged prostate with lower urinary tract symptoms: Secondary | ICD-10-CM | POA: Diagnosis not present

## 2023-01-29 DIAGNOSIS — R351 Nocturia: Secondary | ICD-10-CM | POA: Diagnosis not present

## 2023-01-29 DIAGNOSIS — I10 Essential (primary) hypertension: Secondary | ICD-10-CM

## 2023-01-29 NOTE — Assessment & Plan Note (Signed)
Hypertension diagnosed long-term currently uncontrolled on current medications however stressful overnight symptoms are potential for elevation this morning. BP goal < 140 mmHg. Medication adherence appears good.  -- Continued amlodipine-valsartan 5-160mg  daily, metoprolol 50mg  succinate daily. -- Patient educated on purpose, proper use, and potential adverse effects.  -- Counseled on lifestyle modifications for blood pressure control including reduced dietary sodium, increased exercise, adequate sleep. --Follow-up in two weeks for blood pressure reevaluation.

## 2023-01-29 NOTE — Assessment & Plan Note (Signed)
Dysgeusia - possibly due to tamsulosin.  Discussed potential other agents including blood pressure agents which were initiated at the time of the symptoms.  Hydrochlorothiazide was only other agent initiated at that time and it has been stopped at this time.   -- Stopped tamsulosin 0.4 mg today -- Plan to initiate new medication for BPH at next visit pending taste disturbance resolution

## 2023-01-29 NOTE — Progress Notes (Signed)
Reviewed and agree with Dr Koval's plan.   

## 2023-01-29 NOTE — Patient Instructions (Signed)
It was nice to see you today!  Your goal blood pressure is <140 mmHg.  Medication Changes: STOP tamsulosin 0.4 mg once daily.   Continue all other medication the same.   Monitor blood pressure at home daily and keep a log (on your phone or piece of paper) to bring with you to your next visit. Write down date, time, blood pressure and pulse.  Keep up the good work with diet and exercise. Aim for a diet full of vegetables, fruit and lean meats (chicken, [place], fish). Try to limit salt intake by eating fresh or frozen vegetables (instead of canned), rinse canned vegetables prior to cooking and do not add any additional salt to meals.

## 2023-01-29 NOTE — Progress Notes (Signed)
S:     Chief Complaint  Patient presents with   Medication Management    HTN/Taste disturbance   82 y.o. male who presents for hypertension evaluation, education, and management.  PMH is significant for HTN, dysgeusia, seizure disorder.  Patient was referred and last seen by Primary Care Provider, Dr. Laroy Apple, on 01/23/2023.   At last visit, seen for follow up on HTN and metallic taste in mouth. Assessed labs for reasons for taste disturbance, prescribed magic mouthwash but did not help. Switched blood pressure medication from olmesartan-hydrochlorothiazide to amlodipine-valsartan.  Today, patient arrives in good spirits and presents without assistance. Denies dizziness, swelling. Reports one sided headache (right side) and visual disturbances (black spots) in left eye yesterday night and called ambulance. EMS shared that BP was not concerning, patient reported symptom resolution and refused option for ER transfer from EMS. Continues to report bad taste from medication-- stopped olmesartan-hydrochlorothiazide but still experiencing bad taste in mouth.   Patient reports hypertension was diagnosed years ago.   Medication adherence good. Patient has taken BP medications today.   Current antihypertensives include: amlodipine-valsartan 5-160 mg one tablet PO daily, metoprolol 50 mg one tablet PO daily  Antihypertensives tried in the past include: olmesartan 40 mg, olmesartan-hydrochlorothiazide 20-12.5 mg   ASCVD risk factors include: HTN  O:  Review of Systems  Neurological:  Positive for sensory change (Bad taste in mouth).  All other systems reviewed and are negative.   Physical Exam Vitals reviewed.  Constitutional:      Appearance: Normal appearance.  Pulmonary:     Effort: Pulmonary effort is normal.  Neurological:     Mental Status: He is alert.  Psychiatric:        Mood and Affect: Mood normal.        Behavior: Behavior normal.        Thought Content: Thought content  normal.        Judgment: Judgment normal.    Last 3 Office BP readings: BP Readings from Last 3 Encounters:  01/29/23 (!) 150/64  01/23/23 (!) 154/65  01/07/23 (!) 164/74    BMET    Component Value Date/Time   NA 138 01/23/2023 1108   K 4.3 01/23/2023 1108   CL 101 01/23/2023 1108   CO2 22 01/23/2023 1108   GLUCOSE 106 (H) 01/23/2023 1108   GLUCOSE 121 (H) 03/28/2021 0615   BUN 11 01/23/2023 1108   CREATININE 1.02 01/23/2023 1108   CALCIUM 10.4 (H) 01/23/2023 1108   GFRNONAA >60 03/28/2021 0615   GFRAA 80 02/08/2019 1506    Renal function: Estimated Creatinine Clearance: 49.5 mL/min (by C-G formula based on SCr of 1.02 mg/dL).  Patient is participating in a Managed Medicaid Plan: Yes   A/P: Hypertension diagnosed long-term currently uncontrolled on current medications however stressful overnight symptoms are potential for elevation this morning. BP goal < 140 mmHg. Medication adherence appears good.  -- Continued amlodipine-valsartan 5-160mg  daily, metoprolol 50mg  succinate daily. -- Patient educated on purpose, proper use, and potential adverse effects.  -- Counseled on lifestyle modifications for blood pressure control including reduced dietary sodium, increased exercise, adequate sleep. --Follow-up in two weeks for blood pressure reevaluation.  Dysgeusia - possibly due to tamsulosin.  Discussed potential other agents including blood pressure agents which were initiated at the time of the symptoms.  Hydrochlorothiazide was only other agent initiated at that time and it has been stopped at this time.   -- Stopped tamsulosin 0.4 mg today -- Plan to initiate new  medication for BPH at next visit pending taste disturbance resolution  -- Encouraged patient to check BP at home and bring log of readings to next visit. Counseled on proper use of home BP cuff.   Results reviewed and written information provided.    Written patient instructions provided. Patient verbalized  understanding of treatment plan.  Total time in face to face counseling 41 minutes.    Follow-up:  Pharmacist 02/12/23. PCP clinic visit PRN.  Patient seen with Lendon Ka, PharmD Candidate and Shona Simpson, PharmD Candidate.

## 2023-01-30 LAB — BASIC METABOLIC PANEL
BUN/Creatinine Ratio: 11 (ref 10–24)
BUN: 11 mg/dL (ref 8–27)
CO2: 22 mmol/L (ref 20–29)
Calcium: 10.4 mg/dL — ABNORMAL HIGH (ref 8.6–10.2)
Chloride: 101 mmol/L (ref 96–106)
Creatinine, Ser: 1.02 mg/dL (ref 0.76–1.27)
Glucose: 106 mg/dL — ABNORMAL HIGH (ref 70–99)
Potassium: 4.3 mmol/L (ref 3.5–5.2)
Sodium: 138 mmol/L (ref 134–144)
eGFR: 73 mL/min/{1.73_m2} (ref >59)

## 2023-01-30 LAB — PHENYTOIN LEVEL, FREE AND TOTAL
Phenytoin, Free: 0.6 ug/mL — ABNORMAL LOW (ref 1.0–2.0)
Phenytoin, Serum: 8.7 ug/mL — ABNORMAL LOW (ref 10.0–20.0)

## 2023-01-30 LAB — LEAD, BLOOD (ADULT >= 16 YRS): Lead-Whole Blood: 2.1 ug/dL (ref 0.0–3.4)

## 2023-01-30 LAB — VITAMIN B12: Vitamin B-12: 671 pg/mL (ref 232–1245)

## 2023-02-11 ENCOUNTER — Telehealth: Payer: Self-pay

## 2023-02-11 NOTE — Telephone Encounter (Signed)
Received VM from patient's daughter requesting that we call patient.   Called patient. He reports that he stopped taking tamsulosin because of a bad taste in his mouth.   He reports that he thinks that bad taste came from being dehydrated. He feels that since he has been drinking more water, bad taste has greatly improved.   Patient is asking if he should restart the tamsulosin.   He is also asking if he is due to Colonoscopy. Per chart review, patient had colonscopy with [facility] on 10/24/2011.  Will route to PCP for further advisement.   Veronda Prude, RN

## 2023-02-12 ENCOUNTER — Ambulatory Visit: Payer: Medicare Other | Admitting: Pharmacist

## 2023-02-12 NOTE — Telephone Encounter (Signed)
Called patient and confirmed date of birth.  Discussed that I would avoid tamsulosin use given it had given that to his next month.  States that he took 1 pill yesterday and noticed the taste was back.  Discussed to stop this medication he was agreeable to.  We discussed his last colonoscopy in 2013 overall normal.  We discussed aging out of screening colonoscopy at age 82.  We discussed if he has symptoms of bowel movement changes or blood in stool to let us know and that would GI evaluation.  Patient appreciative of call.

## 2023-02-21 ENCOUNTER — Other Ambulatory Visit: Payer: Self-pay | Admitting: Student

## 2023-02-21 DIAGNOSIS — I10 Essential (primary) hypertension: Secondary | ICD-10-CM

## 2023-02-24 ENCOUNTER — Other Ambulatory Visit: Payer: Self-pay

## 2023-02-24 ENCOUNTER — Other Ambulatory Visit: Payer: Medicare Other

## 2023-02-24 DIAGNOSIS — R946 Abnormal results of thyroid function studies: Secondary | ICD-10-CM

## 2023-02-25 LAB — TSH: TSH: 0.42 m[IU]/L (ref 0.40–4.50)

## 2023-02-25 LAB — T4, FREE: Free T4: 1 ng/dL (ref 0.8–1.8)

## 2023-03-05 ENCOUNTER — Encounter: Payer: Self-pay | Admitting: "Endocrinology

## 2023-03-05 ENCOUNTER — Ambulatory Visit (INDEPENDENT_AMBULATORY_CARE_PROVIDER_SITE_OTHER): Payer: Medicare Other | Admitting: "Endocrinology

## 2023-03-05 VITALS — BP 140/78 | HR 94 | Ht 66.0 in | Wt 162.6 lb

## 2023-03-05 DIAGNOSIS — E0590 Thyrotoxicosis, unspecified without thyrotoxic crisis or storm: Secondary | ICD-10-CM

## 2023-03-05 NOTE — Progress Notes (Signed)
Outpatient Endocrinology Note Phillip [place], MD  03/05/23   Phillip Hill 1940-11-14 [id number]  Referring Provider: Westley Chandler, MD Primary Care Provider: Levin Erp, MD Subjective  No chief complaint on file.   Assessment & Plan  Diagnoses and all orders for this visit:  Subclinical hyperthyroidism -     Cancel: TSH(Reflex) -     TRAb (TSH Receptor Binding Antibody) -     Thyroid stimulating immunoglobulin -     T3, free    [name] is currently not taking any thyroid medication. No thyromegaly noted on exam TSH hovers on low-normal, between 0.3-0.6 at baseline. Patient is currently biochemically euthyroid. No medication indicated.  Educated on thyroid axis.  Repeat lab before next visit or sooner if symptoms of hyperthyroidism or hypothyroidism develop.  Notify us immediately in case of pregnancy/breastfeeding or significant weight gain or loss. Counseled on follow up needs.  I have reviewed current medications, nurse's notes, allergies, vital signs, past medical and surgical history, family medical history, and social history for this encounter. Counseled patient on symptoms, examination findings, lab findings, imaging results, treatment decisions and monitoring and prognosis. The patient understood the recommendations and agrees with the treatment plan. All questions regarding treatment plan were fully answered.   Return in about 6 months (around 09/03/2023) for visit, labs today, labs before next visit.   Phillip Kendall, MD  03/05/23   I have reviewed current medications, nurse's notes, allergies, vital signs, past medical and surgical history, family medical history, and social history for this encounter. Counseled patient on symptoms, examination findings, lab findings, imaging results, treatment decisions and monitoring and prognosis. The patient understood the recommendations and agrees with the treatment plan. All questions regarding treatment plan  were fully answered.   History of Present Illness Phillip Hill is a 82 y.o. year old male who presents to our clinic with subclinical hyperthyroidism diagnosed in 03/2021.    Symptoms suggestive of HYPERTHYROIDISM:  weight loss  No heat intolerance No hyperdefecation  No palpitations  Yes, reports history of irregular heart beat and takes medication for it  Fractures/osteoporosis No   Compressive symptoms:  dysphagia  No dysphonia  No positional dyspnea (especially with simultaneous arms elevation)  No  Smokes  No On biotin  No Personal history of head/neck surgery/irradiation  No  Physical Exam  BP (!) 140/78   Pulse 94   Ht 5\' 6"  (1.676 m)   Wt 162 lb 9.6 oz (73.8 kg)   SpO2 97%   BMI 26.24 kg/m  Constitutional: well developed, well nourished Head: normocephalic, atraumatic, no exophthalmos, hs prosthetic right arm  Eyes: sclera anicteric, no redness Neck: no thyromegaly, no thyroid tenderness; no nodules palpated Lungs: normal respiratory effort Neurology: alert and oriented, no fine hand tremor Skin: dry, no appreciable rashes Musculoskeletal: no appreciable defects Psychiatric: normal mood and affect  Allergies Allergies  Allergen Reactions   Ciprofloxacin Other (See Comments)    "shaking, like cold chills"   Penicillins Hives    Denies shortness of breath    Current Medications Patient's Medications  New Prescriptions   No medications on file  Previous Medications   AMLODIPINE-VALSARTAN (EXFORGE) 5-160 MG TABLET    TAKE 1 TABLET BY MOUTH EVERY DAY   CLOTRIMAZOLE 1 % OINT    Apply 1 application  topically in the morning and at bedtime.   FLUTICASONE (FLONASE) 50 MCG/ACT NASAL SPRAY    Place 2 sprays into both nostrils daily.   LORATADINE (CLARITIN REDITABS)  10 MG DISSOLVABLE TABLET    Take 1 tablet (10 mg total) by mouth daily. As needed for allergy symptoms   MAGIC MOUTHWASH (LIDOCAINE, DIPHENHYDRAMINE, ALUM & MAG HYDROXIDE) SUSPENSION    Swish and spit 5  mLs 3 (three) times daily as needed for mouth pain.   METOPROLOL SUCCINATE (TOPROL-XL) 50 MG 24 HR TABLET    Take 1 tablet (50 mg total) by mouth daily.   MULTIPLE VITAMIN (MULTIVITAMIN WITH MINERALS) TABS TABLET    Take 1 tablet by mouth daily.   PHENYTOIN (DILANTIN) 100 MG ER CAPSULE    Take 3 capsules (300 mg total) by mouth at bedtime.   SILDENAFIL (VIAGRA) 100 MG TABLET    Take 0.5-1 tablets (50-100 mg total) by mouth daily as needed for erectile dysfunction.   VALACYCLOVIR (VALTREX) 500 MG TABLET    Take 1 tablet (500 mg total) by mouth daily.  Modified Medications   No medications on file  Discontinued Medications   No medications on file    Past Medical History Past Medical History:  Diagnosis Date   Arthritis    Chronic cystitis    Complex partial seizure disorder (HCC)    began 68's--  last seizure 1995  per neurologist note 10/ 2015--  dr [place]ace [place]   Diverticulosis of colon    Frequency of urination    Hemorrhoids    History of herpes simplex type 2 infection    History of lower GI bleeding    secondary to ASA use--  resolved   Hypertension    Lazy eye of left side    Nocturia    PAC (premature atrial contraction)    Seizures (HCC)    Urgency of urination     Past Surgical History Past Surgical History:  Procedure Laterality Date   ARM AMPUTATION Right 1960   infection due to injury   CHOLECYSTECTOMY N/A 10/22/2015   Procedure: LAPAROSCOPIC CHOLECYSTECTOMY WITH INTRAOPERATIVE CHOLANGIOGRAM;  Surgeon: Almond Lint, MD;  Location: [facility] OR;  Service: General;  Laterality: N/A;   COLONOSCOPY  10/24/2011   Procedure: COLONOSCOPY;  Surgeon: Rachael Fee, MD;  Location: [facility];  Service: Endoscopy;  Laterality: N/A;   CYSTOSCOPY WITH BIOPSY N/A 06/28/2014   Procedure: CYSTOSCOPY WITH BIOPSY;  Surgeon: Su Grand, MD;  Location: [facility];  Service: Urology;  Laterality: N/A;   INGUINAL HERNIA REPAIR Left 09-15-2009   TRANSTHORACIC ECHOCARDIOGRAM   10-28-2013   dr Gloris [place] turner   mild focal basal hypertrophy of septum/  ef 60-65%/  grade I diastolic dysfunction/  mild AR/  trivial MR and TR    Family History family history includes Cancer in his brother, mother, and sister.  Social History Social History   Socioeconomic History   Marital status: Divorced    Spouse name: Not on file   Number of children: 2   Years of education: 9 th   Highest education level: Not on file  Occupational History    Comment: Retired/Disabled  Tobacco Use   Smoking status: Former    Current packs/day: 0.00    Types: Cigarettes    Quit date: 10/23/1968    Years since quitting: 54.4    Passive exposure: Past   Smokeless tobacco: Never  Vaping Use   Vaping status: Never Used  Substance and Sexual Activity   Alcohol use: No    Comment: Quit alcohol in 1970   Drug use: No   Sexual activity: Not on file  Other Topics Concern   Not  on file  Social History Narrative   Patient lives at home his brother stayes with him and grandson .Retired / Disabled.Education 9 th gradeCaffeine Six cups of coffee daily.Left handed.   Social Determinants of Health   Financial Resource Strain: Not on file  Food Insecurity: Not on file  Transportation Needs: Not on file  Physical Activity: Not on file  Stress: Not on file  Social Connections: Not on file  Intimate Partner Violence: Not on file    Laboratory Investigations Lab Results  Component Value Date   TSH 0.42 02/24/2023   TSH 0.622 10/31/2022   TSH 0.314 (L) 09/24/2022   FREET4 1.0 02/24/2023     No results found for: "TSI"   No components found for: "TRAB"   Lab Results  Component Value Date   CHOL 148 09/24/2022   Lab Results  Component Value Date   HDL 46 09/24/2022   Lab Results  Component Value Date   LDLCALC 84 09/24/2022   Lab Results  Component Value Date   TRIG 96 09/24/2022   Lab Results  Component Value Date   CHOLHDL 3.2 09/24/2022   Lab Results  Component Value  Date   CREATININE 1.02 01/23/2023   No results found for: "GFR"    Component Value Date/Time   NA 138 01/23/2023 1108   K 4.3 01/23/2023 1108   CL 101 01/23/2023 1108   CO2 22 01/23/2023 1108   GLUCOSE 106 (H) 01/23/2023 1108   GLUCOSE 121 (H) 03/28/2021 0615   Phillip 11 01/23/2023 1108   CREATININE 1.02 01/23/2023 1108   CALCIUM 10.4 (H) 01/23/2023 1108   PROT 7.8 09/24/2022 1149   ALBUMIN 4.2 09/24/2022 1149   AST 21 09/24/2022 1149   ALT 17 09/24/2022 1149   ALKPHOS 138 (H) 09/24/2022 1149   BILITOT 0.3 09/24/2022 1149   GFRNONAA >60 03/28/2021 0615   GFRAA 80 02/08/2019 1506      Latest Ref Rng & Units 01/23/2023   11:08 AM 12/10/2022   12:33 PM 11/18/2022   12:25 PM  BMP  Glucose 70 - 99 mg/dL 962  952  841   Phillip 8 - 27 mg/dL 11  18  13    Creatinine 0.76 - 1.27 mg/dL 3.24  4.01  0.27   Phillip/Creat Ratio 10 - 24 11  17  12    Sodium 134 - 144 mmol/L 138  138  138   Potassium 3.5 - 5.2 mmol/L 4.3  3.9  4.3   Chloride 96 - 106 mmol/L 101  101  101   CO2 20 - 29 mmol/L 22  20  20    Calcium 8.6 - 10.2 mg/dL 25.3  9.6  66.4        Component Value Date/Time   WBC 5.9 09/24/2022 1149   WBC 5.4 03/28/2021 0615   RBC 4.32 09/24/2022 1149   RBC 4.14 (L) 03/28/2021 0615   HGB 13.8 09/24/2022 1149   HCT 40.3 09/24/2022 1149   PLT 293 09/24/2022 1149   MCV 93 09/24/2022 1149   MCH 31.9 09/24/2022 1149   MCH 31.9 03/28/2021 0615   MCHC 34.2 09/24/2022 1149   MCHC 34.0 03/28/2021 0615   RDW 12.1 09/24/2022 1149   LYMPHSABS 2.2 03/28/2021 0615   LYMPHSABS 1.6 12/19/2017 1045   MONOABS 0.6 03/28/2021 0615   EOSABS 0.1 03/28/2021 0615   EOSABS 0.0 12/19/2017 1045   BASOSABS 0.0 03/28/2021 0615   BASOSABS 0.0 12/19/2017 1045      Parts of this  note may have been dictated using voice recognition software. There may be variances in spelling and vocabulary which are unintentional. Not all errors are proofread. Please notify the [facility] Parkin if any discrepancies are noted or if the  meaning of any statement is not clear.

## 2023-03-08 ENCOUNTER — Other Ambulatory Visit: Payer: Self-pay | Admitting: Student

## 2023-03-08 DIAGNOSIS — I10 Essential (primary) hypertension: Secondary | ICD-10-CM

## 2023-03-08 LAB — T3, FREE: T3, Free: 3.7 pg/mL (ref 2.3–4.2)

## 2023-03-08 LAB — TRAB (TSH RECEPTOR BINDING ANTIBODY): TRAB: <1 [IU]/L (ref <=2.00)

## 2023-03-08 LAB — THYROID STIMULATING IMMUNOGLOBULIN: TSI: <89 %{baseline} (ref <140)

## 2023-03-10 ENCOUNTER — Other Ambulatory Visit: Payer: Self-pay | Admitting: "Endocrinology

## 2023-03-10 DIAGNOSIS — E0590 Thyrotoxicosis, unspecified without thyrotoxic crisis or storm: Secondary | ICD-10-CM

## 2023-04-05 ENCOUNTER — Other Ambulatory Visit: Payer: Self-pay | Admitting: Student

## 2023-04-05 DIAGNOSIS — I10 Essential (primary) hypertension: Secondary | ICD-10-CM

## 2023-04-22 ENCOUNTER — Ambulatory Visit (INDEPENDENT_AMBULATORY_CARE_PROVIDER_SITE_OTHER): Payer: Medicare Other | Admitting: Student

## 2023-04-22 ENCOUNTER — Encounter: Payer: Self-pay | Admitting: Student

## 2023-04-22 VITALS — BP 156/78 | HR 92 | Ht 66.0 in | Wt 165.2 lb

## 2023-04-22 DIAGNOSIS — Z8719 Personal history of other diseases of the digestive system: Secondary | ICD-10-CM

## 2023-04-22 DIAGNOSIS — I10 Essential (primary) hypertension: Secondary | ICD-10-CM

## 2023-04-22 NOTE — Progress Notes (Signed)
    SUBJECTIVE:   CHIEF COMPLAINT / HPI: Issues where hernia was removed  HTN States he took his BP medicine (exforge) and heart medicine (metoprolol) Feel dizzy (lightheaded) and head intermittently hurts, no current vision changes. No sided weakness  (has hx of right leg limp at baseline from right knee pain  and right hand prosthesis).  Sometimes when getting up some gets some floaters. No CP, SOB, swelling.  Left groin hernia repair  Had this inguinal hernia repair in 2011.   Opening leg in different positionss sometimes hurts down there Never gets mass that is stuck out there and really painful  PERTINENT  PMH / PSH: right upper extremity prosthesis  OBJECTIVE:   BP (!) 156/78   Pulse 92   Ht 5\' 6"  (1.676 m)   Wt 165 lb 3.2 oz (74.9 kg)   SpO2 98%   BMI 26.66 kg/m   General: Well appearing, NAD, awake, alert, responsive to questions Head: Normocephalic atraumatic, PERRL CV: Regular rate and rhythm no murmurs rubs or gallops Respiratory:  chest rises symmetrically,  no increased work of breathing Extremities: Moves upper and lower extremities freely Groin: Left groin without fullness or fluctuance or erythema or masses.  Nontender to palpation, no protrusions with coughing Neuro: CN II: PERRL CN III, IV,VI: EOMI CV V: Normal sensation in V1, V2, V3 CVII: Symmetric smile and brow raise CN VIII: Normal hearing CN IX,X: Symmetric palate raise  CN XI: 5/5 shoulder shrug CN XII: Symmetric tongue protrusion  UE and LE strength 5/5 Normal sensation in UE and LE bilaterally (right arm prosthesis) No ataxia with finger to nose  ASSESSMENT/PLAN:   Assessment & Plan History of left inguinal hernia History of hernia repair in 2011. Having intermittent irritation at this site. -Referral to General Surgery for evaluation -ED/return precautions discussed Primary hypertension Patient's blood pressure is not controlled today. Initially SBP 190. BP: (!) 156/78. Goal of 140/90.  Patient's medication regimen includes Exforge 5-160 mg daily.  No issues on neurologic examination. -Changes to current regimen include none, patient to bring in medications at next visit in 2 weeks -ED/return precautions discussed   Levin Erp, MD [facility]

## 2023-04-22 NOTE — Assessment & Plan Note (Signed)
Patient's blood pressure is not controlled today. Initially SBP 190. BP: (!) 156/78. Goal of 140/90. Patient's medication regimen includes Exforge 5-160 mg daily.  No issues on neurologic examination. -Changes to current regimen include none, patient to bring in medications at next visit in 2 weeks -ED/return precautions discussed

## 2023-04-22 NOTE — Patient Instructions (Addendum)
It was great to see you! Thank you for allowing me to participate in your care!   Our plans for today:  - General surgery referral for your inguinal hernia history - return to care if fevers, if the mass gets stuck out and very painful - Your blood pressure is very elevated today but came down nicely-please return to care in 2 weeks for BP follow up and please bring all of your blood pressure medication bottles with you. -Go to ED if having sided weakness or bad headaches dn BP not improving  Take care and seek immediate care sooner if you develop any concerns.  Levin Erp, MD

## 2023-05-07 ENCOUNTER — Ambulatory Visit: Payer: Medicare Other | Admitting: Student

## 2023-05-07 ENCOUNTER — Encounter: Payer: Self-pay | Admitting: Student

## 2023-05-07 VITALS — BP 156/62 | HR 75 | Resp 98 | Wt 162.0 lb

## 2023-05-07 DIAGNOSIS — I10 Essential (primary) hypertension: Secondary | ICD-10-CM | POA: Diagnosis not present

## 2023-05-07 NOTE — Assessment & Plan Note (Signed)
Patient's blood pressure is not controlled today. BP: (!) 156/62. Goal of 140/90. Patient's medication regimen includes Exforge 5-160 -Changes to current regimen include none, will schedule virtual appointment so patient can read out medication bottles he has at home -Follow up in 2-4 weeks

## 2023-05-07 NOTE — Patient Instructions (Signed)
It was great to see you! Thank you for allowing me to participate in your care!   Our plans for today:  - If BP still is high please schedule a VIRTUAL appointment with me in the next month so we can read through all your medicine bottles at home and adjust accordingly  Take care and seek immediate care sooner if you develop any concerns.  Levin Erp, MD

## 2023-05-07 NOTE — Progress Notes (Signed)
    SUBJECTIVE:   CHIEF COMPLAINT / HPI:   Presents for a follow-up visit for BP but did nto bring medications He reports taking a combination pill of amlodipine and valsartan for blood pressure control, but he did not bring his medication to the appointment for verification and is not completely sure.  He has not been monitoring his blood pressure at home recently.   PERTINENT  PMH / PSH: reviewed  OBJECTIVE:   BP (!) 160/66   Pulse 75   Resp (!) 98   Wt 162 lb (73.5 kg)   BMI 26.15 kg/m   General: Well appearing, NAD, awake, alert, responsive to questions Head: Normocephalic atraumatic Respiratory: chest rises symmetrically,  no increased work of breathing  ASSESSMENT/PLAN:   Assessment & Plan Hypertension, unspecified type Patient's blood pressure is not controlled today. BP: (!) 156/62. Goal of 140/90. Patient's medication regimen includes Exforge 5-160 -Changes to current regimen include none, will schedule virtual appointment so patient can read out medication bottles he has at home -Follow up in 2-4 weeks    Levin Erp, MD [facility]

## 2023-05-19 ENCOUNTER — Other Ambulatory Visit: Payer: Self-pay | Admitting: Student

## 2023-05-19 DIAGNOSIS — I10 Essential (primary) hypertension: Secondary | ICD-10-CM

## 2023-05-19 DIAGNOSIS — L989 Disorder of the skin and subcutaneous tissue, unspecified: Secondary | ICD-10-CM

## 2023-05-23 ENCOUNTER — Other Ambulatory Visit: Payer: Self-pay | Admitting: Student

## 2023-05-23 DIAGNOSIS — L989 Disorder of the skin and subcutaneous tissue, unspecified: Secondary | ICD-10-CM

## 2023-07-07 ENCOUNTER — Other Ambulatory Visit: Payer: Self-pay | Admitting: Student

## 2023-07-10 ENCOUNTER — Other Ambulatory Visit: Payer: Self-pay | Admitting: Student

## 2023-08-09 ENCOUNTER — Other Ambulatory Visit: Payer: Self-pay | Admitting: Student

## 2023-08-16 ENCOUNTER — Other Ambulatory Visit: Payer: Self-pay | Admitting: Student

## 2023-08-16 DIAGNOSIS — I10 Essential (primary) hypertension: Secondary | ICD-10-CM

## 2023-08-28 ENCOUNTER — Other Ambulatory Visit: Payer: Medicare Other

## 2023-09-01 ENCOUNTER — Ambulatory Visit

## 2023-09-01 VITALS — Ht 66.0 in | Wt 159.0 lb

## 2023-09-01 DIAGNOSIS — Z0000 Encounter for general adult medical examination without abnormal findings: Secondary | ICD-10-CM | POA: Diagnosis not present

## 2023-09-01 NOTE — Progress Notes (Signed)
 Because this visit was a virtual/telehealth visit,  certain criteria was not obtained, such a blood pressure, CBG if applicable, and timed get up and go. Any medications not marked as "taking" were not mentioned during the medication reconciliation part of the visit. Any vitals not documented were not able to be obtained due to this being a telehealth visit or patient was unable to self-report a recent blood pressure reading due to a lack of equipment at home via telehealth. Vitals that have been documented are verbally provided by the patient.   Subjective:   Phillip Hill is a 83 y.o. who presents for a Medicare Wellness preventive visit.  As a reminder, Annual Wellness Visits don't include a physical exam, and some assessments may be limited, especially if this visit is performed virtually. We may recommend an in-person follow-up visit with your provider if needed.  Visit Complete: Virtual I connected with  Phillip Hill on 09/01/23 by a audio enabled telemedicine application and verified that I am speaking with the correct person using two identifiers.  Patient Location: Home  Provider Location: Office/Clinic  I discussed the limitations of evaluation and management by telemedicine. The patient expressed understanding and agreed to proceed.  Vital Signs: Because this visit was a virtual/telehealth visit, some criteria may be missing or patient reported. Any vitals not documented were not able to be obtained and vitals that have been documented are patient reported.  VideoDeclined- This patient declined Librarian, academic. Therefore the visit was completed with audio only.  Persons Participating in Visit: Patient.  AWV Questionnaire: No: Patient Medicare AWV questionnaire was not completed prior to this visit.  Cardiac Risk Factors include: advanced age (>34men, >19 women);sedentary lifestyle;hypertension;male gender     Objective:     Today's Vitals    09/01/23 1423  Weight: 159 lb (72.1 kg)  Height: 5\' 6"  (1.676 m)  PainSc: 0-No pain   Body mass index is 25.66 kg/m.     09/01/2023    2:25 PM 05/07/2023   10:08 AM 04/22/2023    1:41 PM 01/23/2023    9:19 AM 01/07/2023   10:09 AM 12/10/2022   10:38 AM 11/18/2022    9:11 AM  Advanced Directives  Does Patient Have a Medical Advance Directive? No No No No No No No  Would patient like information on creating a medical advance directive? No - Patient declined No - Patient declined No - Patient declined No - Patient declined No - Patient declined No - Patient declined No - Patient declined    Current Medications (verified) Outpatient Encounter Medications as of 09/01/2023  Medication Sig   amLODipine -valsartan  (EXFORGE) 5-160 MG tablet TAKE 1 TABLET BY MOUTH EVERY DAY   Clotrimazole  1 % OINT Apply 1 application  topically in the morning and at bedtime.   fluticasone  (FLONASE ) 50 MCG/ACT nasal spray Place 2 sprays into both nostrils daily.   loratadine  (CLARITIN  REDITABS) 10 MG dissolvable tablet Take 1 tablet (10 mg total) by mouth daily. As needed for allergy symptoms (Patient not taking: Reported on 01/29/2023)   magic mouthwash (lidocaine , diphenhydrAMINE , alum & mag hydroxide) suspension Swish and spit 5 mLs 3 (three) times daily as needed for mouth pain. (Patient not taking: Reported on 01/29/2023)   metoprolol  succinate (TOPROL -XL) 50 MG 24 hr tablet TAKE 1 TABLET BY MOUTH EVERY DAY   Multiple Vitamin (MULTIVITAMIN WITH MINERALS) TABS tablet Take 1 tablet by mouth daily.   phenytoin  (DILANTIN ) 100 MG ER capsule TAKE 3 CAPSULES BY  MOUTH AT BEDTIME.   sildenafil  (VIAGRA ) 100 MG tablet TAKE 1/2 TO 1 (ONE-HALF TO ONE) TABLET BY MOUTH ONCE DAILY AS NEEDED FOR ERECTILE DYSFUNCTION   valACYclovir  (VALTREX ) 500 MG tablet TAKE 1 TABLET (500 MG TOTAL) BY MOUTH DAILY.   No facility-administered encounter medications on file as of 09/01/2023.    Allergies (verified) Ciprofloxacin  and Penicillins    History: Past Medical History:  Diagnosis Date   Arthritis    Chronic cystitis    Complex partial seizure disorder (HCC)    began 1970's--  last seizure 1995  per neurologist note 10/ 2015--  dr Gracie Lav   Diverticulosis of colon    Frequency of urination    Hemorrhoids    History of herpes simplex type 2 infection    History of lower GI bleeding    secondary to ASA use--  resolved   Hypertension    Lazy eye of left side    Nocturia    PAC (premature atrial contraction)    Seizures (HCC)    Urgency of urination    Past Surgical History:  Procedure Laterality Date   ARM AMPUTATION Right 1960   infection due to injury   CHOLECYSTECTOMY N/A 10/22/2015   Procedure: LAPAROSCOPIC CHOLECYSTECTOMY WITH INTRAOPERATIVE CHOLANGIOGRAM;  Surgeon: Lockie Rima, MD;  Location: [facility] OR;  Service: General;  Laterality: N/A;   COLONOSCOPY  10/24/2011   Procedure: COLONOSCOPY;  Surgeon: Janel Medford, MD;  Location: [facility];  Service: Endoscopy;  Laterality: N/A;   CYSTOSCOPY WITH BIOPSY N/A 06/28/2014   Procedure: CYSTOSCOPY WITH BIOPSY;  Surgeon: Jock Muller, MD;  Location: [facility];  Service: Urology;  Laterality: N/A;   INGUINAL HERNIA REPAIR Left 09-15-2009   TRANSTHORACIC ECHOCARDIOGRAM  10-28-2013   dr Sophia Dustman turner   mild focal basal hypertrophy of septum/  ef 60-65%/  grade I diastolic dysfunction/  mild AR/  trivial MR and TR   Family History  Problem Relation Age of Onset   Cancer Mother    Cancer Sister    Cancer Brother    Social History   Socioeconomic History   Marital status: Divorced    Spouse name: Not on file   Number of children: 2   Years of education: 9 th   Highest education level: Not on file  Occupational History    Comment: Retired/Disabled  Tobacco Use   Smoking status: Former    Current packs/day: 0.00    Types: Cigarettes    Quit date: 10/23/1968    Years since quitting: 54.8    Passive exposure: Past   Smokeless tobacco: Never  Vaping  Use   Vaping status: Never Used  Substance and Sexual Activity   Alcohol use: No    Comment: Quit alcohol in 1970   Drug use: No   Sexual activity: Not on file  Other Topics Concern   Not on file  Social History Narrative   Patient lives at home his brother stayes with him and grandson .Retired / Disabled.Education 9 th gradeCaffeine Six cups of coffee daily. Left handed.   Social Drivers of Corporate investment banker Strain: Low Risk  (09/01/2023)   Overall Financial Resource Strain (CARDIA)    Difficulty of Paying Living Expenses: Not hard at all  Food Insecurity: No Food Insecurity (09/01/2023)   Hunger Vital Sign    Worried About Running Out of Food in the Last Year: Never true    Ran Out of Food in the Last Year: Never true  Transportation Needs: No Transportation Needs (09/01/2023)   PRAPARE - Administrator, Civil Service (Medical): No    Lack of Transportation (Non-Medical): No  Physical Activity: Inactive (09/01/2023)   Exercise Vital Sign    Days of Exercise per Week: 0 days    Minutes of Exercise per Session: 0 min  Stress: No Stress Concern Present (09/01/2023)   [employer] of Occupational Health - Occupational Stress Questionnaire    Feeling of Stress : Not at all  Social Connections: Socially Integrated (09/01/2023)   Social Connection and Isolation Panel [NHANES]    Frequency of Communication with Friends and Family: More than three times a week    Frequency of Social Gatherings with Friends and Family: More than three times a week    Attends Religious Services: More than 4 times per year    Active Member of [employer] or Organizations: Yes    Attends Engineer, structural: More than 4 times per year    Marital Status: Married    Tobacco Counseling Counseling given: Not Answered    Clinical Intake:  Pre-visit preparation completed: Yes  Pain : No/denies pain Pain Score: 0-No pain     BMI - recorded: 25.66 Nutritional Status: BMI 25 -29  Overweight Nutritional Risks: None Diabetes: No  Lab Results  Component Value Date   HGBA1C 5.6 09/24/2022   HGBA1C 5.5 05/27/2016   HGBA1C (H) 07/25/2009    6.0 (NOTE)                                                                       According to the ADA Clinical Practice Recommendations for 2011, when HbA1c is used as a screening test:   >=6.5%   Diagnostic of Diabetes Mellitus           (if abnormal result  is confirmed)  5.7-6.4%   Increased risk of developing Diabetes Mellitus  References:Diagnosis and Classification of Diabetes Mellitus,Diabetes Care,2011,34(Suppl 1):S62-S69 and Standards of Medical Care in         Diabetes - 2011,Diabetes Care,2011,34  (Suppl 1):S11-S61.     How often do you need to have someone help you when you read instructions, pamphlets, or other written materials from your doctor or pharmacy?: 1 - Never  Interpreter Needed?: No  Information entered by :: Shawnika Pepin N. Quinnley Colasurdo, LPN.   Activities of Daily Living     09/01/2023    2:27 PM  In your present state of health, do you have any difficulty performing the following activities:  Hearing? 0  Vision? 0  Difficulty concentrating or making decisions? 0  Walking or climbing stairs? 0  Dressing or bathing? 0  Doing errands, shopping? 0  Preparing Food and eating ? N  Using the Toilet? N  In the past six months, have you accidently leaked urine? N  Do you have problems with loss of bowel control? N  Managing your Medications? N  Managing your Finances? N  Housekeeping or managing your Housekeeping? N    Patient Care Team: [name], MD as PCP - General (Family Medicine) Adelbert Homans, MD as Consulting Physician (Urology) Candyce Champagne, MD as Consulting Physician (General Surgery) Janel Medford, MD (Inactive) as Attending Physician (Gastroenterology) Rayfield Cairo  Melven Stable, MD as Consulting Physician (Neurology) Dr. Chester Costa, OD. as Consulting Physician (Optometry)  I  have updated your Care Teams any recent Medical Services you may have received from other providers in the past year.     Assessment:    This is a routine wellness examination for Phillip Hill.  Hearing/Vision screen Hearing Screening - Comments:: Denies hearing difficulties.  Vision Screening - Comments:: No rx glasses - not up to date with routine eye exams with Dr. Rheta Celestine    Goals Addressed             This Visit's Progress    09/01/2023: My goal is to start exercising more.  I need to walk more anyway.         Depression Screen     09/01/2023    2:25 PM 05/07/2023   10:08 AM 04/22/2023    1:40 PM 01/23/2023    9:19 AM 01/07/2023   10:08 AM 12/10/2022   10:37 AM 11/18/2022    9:10 AM  PHQ 2/9 Scores  PHQ - 2 Score 0 0  0 0 0 0  PHQ- 9 Score 0 0  0 0 0 0  Exception Documentation   Patient refusal        Fall Risk     09/01/2023    2:25 PM 05/07/2023   10:08 AM 04/22/2023    1:40 PM 01/23/2023    9:21 AM 01/07/2023   10:08 AM  Fall Risk   Falls in the past year? 0 0 0 0 0  Number falls in past yr: 0 0 0 0 0  Injury with Fall? 0 0 0 0 0  Risk for fall due to : No Fall Risks No Fall Risks     Follow up Falls evaluation completed        MEDICARE RISK AT HOME:  Medicare Risk at Home Any stairs in or around the home?: No If so, are there any without handrails?: No Home free of loose throw rugs in walkways, pet beds, electrical cords, etc?: Yes Adequate lighting in your home to reduce risk of falls?: Yes Life alert?: No Use of a cane, walker or w/c?: No Grab bars in the bathroom?: No Shower chair or bench in shower?: No Elevated toilet seat or a handicapped toilet?: No  TIMED UP AND GO:  Was the test performed?  No  Cognitive Function: Declined/Normal: No cognitive concerns noted by patient or family. Patient alert, oriented, able to answer questions appropriately and recall recent events. No signs of memory loss or confusion.    09/01/2023    2:25 PM  MMSE -  Mini Mental State Exam  Not completed: Unable to complete        Immunizations Immunization History  Administered Date(s) Administered   Pneumococcal Polysaccharide-23 05/26/2019   Tdap 05/26/2019    Screening Tests Health Maintenance  Topic Date Due   Zoster Vaccines- Shingrix (1 of 2) Never done   Pneumonia Vaccine 45+ Years old (2 of 2 - PCV) 05/25/2020   COVID-19 Vaccine (1 - 2024-25 season) Never done   INFLUENZA VACCINE  10/24/2023   Medicare Annual Wellness (AWV)  08/31/2024   DTaP/Tdap/Td (2 - Td or Tdap) 05/25/2029   HPV VACCINES  Aged Out   Meningococcal B Vaccine  Aged Out    Health Maintenance  Health Maintenance Due  Topic Date Due   Zoster Vaccines- Shingrix (1 of 2) Never done   Pneumonia Vaccine 77+ Years old (2 of  2 - PCV) 05/25/2020   COVID-19 Vaccine (1 - 2024-25 season) Never done   Health Maintenance Items Addressed:Yes Patient is aware of current care gaps.  Patient is due for vaccines.  Additional Screening:  Vision Screening: Recommended annual ophthalmology exams for early detection of glaucoma and other disorders of the eye. Would you like a referral to an eye doctor? No    Dental Screening: Recommended annual dental exams for proper oral hygiene  Community Resource Referral / Chronic Care Management: CRR required this visit?  No   CCM required this visit?  No   Plan:    I have personally reviewed and noted the following in the patient's chart:   Medical and social history Use of alcohol, tobacco or illicit drugs  Current medications and supplements including opioid prescriptions. Patient is not currently taking opioid prescriptions. Functional ability and status Nutritional status Physical activity Advanced directives List of other physicians Hospitalizations, surgeries, and ER visits in previous 12 months Vitals Screenings to include cognitive, depression, and falls Referrals and appointments  In addition, I have reviewed  and discussed with patient certain preventive protocols, quality metrics, and best practice recommendations. A written personalized care plan for preventive services as well as general preventive health recommendations were provided to patient.   Margette Sheldon, LPN   07/28/2128   After Visit Summary: (MyChart) Due to this being a telephonic visit, the after visit summary with patients personalized plan was offered to patient via MyChart   Notes: Patient aware of current care gaps.  Immunization record was verified by [employer].

## 2023-09-01 NOTE — Patient Instructions (Signed)
 [name] , Thank you for taking time out of your busy schedule to complete your Annual Wellness Visit with me. I enjoyed our conversation and look forward to speaking with you again next year. I, as well as your care team,  appreciate your ongoing commitment to your health goals. Please review the following plan we discussed and let me know if I can assist you in the future. Your Game plan/ To Do List    Referrals: If you haven't heard from the office you've been referred to, please reach out to them at the phone provided.   Follow up Visits: Next Medicare AWV with our clinical staff: 09/02/2024 at 2:50 pm Phone Visit with Nurse Health Advisor   Have you seen your provider in the last 6 months (3 months if uncontrolled diabetes)? Yes Next Office Visit with your provider: Office will call to schedule patient   Clinician Recommendations:  Aim for 30 minutes of exercise or brisk walking, 6-8 glasses of water , and 5 servings of fruits and vegetables each day.       This is a list of the screening recommended for you and due dates:  Health Maintenance  Topic Date Due   Zoster (Shingles) Vaccine (1 of 2) Never done   Pneumonia Vaccine (2 of 2 - PCV) 05/25/2020   COVID-19 Vaccine (1 - 2024-25 season) Never done   Flu Shot  10/24/2023   Medicare Annual Wellness Visit  08/31/2024   DTaP/Tdap/Td vaccine (2 - Td or Tdap) 05/25/2029   HPV Vaccine  Aged Out   Meningitis B Vaccine  Aged Out    Advanced directives: (Declined) Advance directive discussed with you today. Even though you declined this today, please call our office should you change your mind, and we can give you the proper paperwork for you to fill out. Advance Care Planning is important because it:  [x]  Makes sure you receive the medical care that is consistent with your values, goals, and preferences  [x]  It provides guidance to your family and loved ones and reduces their decisional burden about whether or not they are making the  right decisions based on your wishes.  Follow the link provided in your after visit summary or read over the paperwork we have mailed to you to help you started getting your Advance Directives in place. If you need assistance in completing these, please reach out to us  so that we can help you!  See attachments for Preventive Care and Fall Prevention Tips.

## 2023-09-02 ENCOUNTER — Encounter: Payer: Self-pay | Admitting: *Deleted

## 2023-09-03 ENCOUNTER — Ambulatory Visit: Payer: Medicare Other | Admitting: "Endocrinology

## 2023-09-08 NOTE — Progress Notes (Unsigned)
    SUBJECTIVE:   CHIEF COMPLAINT / HPI: Medication fu  Discussed the use of AI scribe software for clinical note transcription with the patient, who gave verbal consent to proceed.  History of Present Illness Phillip Hill is an 83 year old male with hypertension who presents for medication review and blood pressure management.  He is currently taking exforge 5-160 and metoprolol  succinate 50 mg. He manages his medications independently but cannot remember names well. He occasionally checks his blood pressure at home, with a recent reading of 135/70 mmHg. No other symptoms are present.  Seen on 2/12 with elevated blood pressure-patient to come back to bring medication bottles for reconciliation Ambulatory BP monitor 2023 AWAKE blood pressure of 142/64 mmHb no med adjustments at that time  PERTINENT  PMH / PSH: seizure disorder, BPH  OBJECTIVE:   BP (!) 156/70   Pulse 99   Ht 5' 6 (1.676 m)   Wt 165 lb 2 oz (74.9 kg)   SpO2 99%   BMI 26.65 kg/m   General: Well appearing, NAD, awake, alert, responsive to questions Head: Normocephalic atraumatic Respiratory: chest rises symmetrically,  no increased work of breathing Extremities: Moves upper and lower extremities freely ASSESSMENT/PLAN:   Assessment & Plan Primary hypertension Hypertension uncontrolled here but with home readings average 135/70 mmHg. 24-hour BP monitoring in 2023 was near normal. I am hesistant to make medication changes given he is not confident about which meds he is taking on a daily basis. - Bring all medications to future appointments for review - Call office with current medications and dosages - 1 month follow up   General Health Maintenance Vaccinations mostly up to date. Declines pneumococcal vaccine today, counseled.  Phillip Kidd, MD [facility]

## 2023-09-09 ENCOUNTER — Encounter: Payer: Self-pay | Admitting: Student

## 2023-09-09 ENCOUNTER — Ambulatory Visit (INDEPENDENT_AMBULATORY_CARE_PROVIDER_SITE_OTHER): Admitting: Student

## 2023-09-09 ENCOUNTER — Telehealth: Payer: Self-pay

## 2023-09-09 VITALS — BP 156/70 | HR 99 | Ht 66.0 in | Wt 165.1 lb

## 2023-09-09 DIAGNOSIS — I10 Essential (primary) hypertension: Secondary | ICD-10-CM | POA: Diagnosis not present

## 2023-09-09 NOTE — Assessment & Plan Note (Signed)
 Hypertension uncontrolled here but with home readings average 135/70 mmHg. 24-hour BP monitoring in 2023 was near normal. I am hesistant to make medication changes given he is not confident about which meds he is taking on a daily basis. - Bring all medications to future appointments for review - Call office with current medications and dosages - 1 month follow up

## 2023-09-09 NOTE — Telephone Encounter (Signed)
 Patients daughter calls nurse line to FU with patients current medications.   She reports he is taking   Amlodipine -Valsartan    Metoprolol      Phenytoin   Valacyclovir   Sildenafil  PRN  Flonase   She reports you can discontinue everything else.   She requests a refill on Flonase .   Will forward to PCP.

## 2023-09-09 NOTE — Patient Instructions (Addendum)
 It was great to see you! Thank you for allowing me to participate in your care!   Our plans for today:  - Please call and let us  know what medication bottles with names and dosage you have - 1 month follow up for blood pressure BRING ALL MEDS - Please let us  know when you are interested in pneumococcal vaccine  Take care and seek immediate care sooner if you develop any concerns.  [name], MD

## 2023-10-06 ENCOUNTER — Other Ambulatory Visit: Payer: Self-pay

## 2023-10-06 MED ORDER — METOPROLOL SUCCINATE ER 50 MG PO TB24: 50.0000 mg | ORAL_TABLET | Freq: Every day | ORAL | 1 refills | Status: DC

## 2023-10-10 ENCOUNTER — Encounter: Payer: Self-pay | Admitting: Student

## 2023-10-10 ENCOUNTER — Ambulatory Visit: Admitting: Student

## 2023-10-10 VITALS — BP 168/94 | HR 86 | Ht 66.0 in | Wt 166.4 lb

## 2023-10-10 DIAGNOSIS — I10 Essential (primary) hypertension: Secondary | ICD-10-CM | POA: Diagnosis not present

## 2023-10-10 DIAGNOSIS — N529 Male erectile dysfunction, unspecified: Secondary | ICD-10-CM

## 2023-10-10 MED ORDER — SILDENAFIL CITRATE 100 MG PO TABS
100.0000 mg | ORAL_TABLET | ORAL | 0 refills | Status: AC | PRN
Start: 2023-10-10 — End: ?

## 2023-10-10 MED ORDER — AMLODIPINE BESYLATE-VALSARTAN 10-320 MG PO TABS: 1.0000 | ORAL_TABLET | Freq: Every day | ORAL | 1 refills | Status: DC

## 2023-10-10 MED ORDER — SILDENAFIL CITRATE 100 MG PO TABS
100.0000 mg | ORAL_TABLET | ORAL | 0 refills | Status: DC | PRN
Start: 2023-10-10 — End: 2023-10-10

## 2023-10-10 NOTE — Assessment & Plan Note (Signed)
 Poorly controlled.  Asymptomatic today. - Increase amlodipine -valsartan  to 10 mg - 320 mg, prescription sent - Follow-up in 1-2 weeks, will check metabolic panel at that time

## 2023-10-10 NOTE — Patient Instructions (Signed)
 It was great to see you! Thank you for allowing me to participate in your care!   I recommend that you always bring your medications to each appointment as this makes it easy to ensure we are on the correct medications and helps us  not miss when refills are needed.  Our plans for today:  - Please pick-up and take your new prescription - Follow-up with me in 1-2 weeks - We will check labs at that time  Take care and seek immediate care sooner if you develop any concerns. Please remember to show up 15 minutes before your scheduled appointment time!  Gladis Church, DO [facility]

## 2023-10-10 NOTE — Progress Notes (Signed)
    SUBJECTIVE:   CHIEF COMPLAINT / HPI:   Hypertension  erectile dysfunction Patient presents for follow-up of his blood pressure.  He brought his medications today for review per his last PCPs recommendations.  Reviewed his medications today.  States he is taking all of them.  Denies side effects.  Reports he is out of his sildenafil , he takes this as needed for erectile dysfunction.  He is requesting a refill be sent to his [facility].    OBJECTIVE:   BP (!) 168/94   Pulse 86   Ht 5' 6 (1.676 m)   Wt 166 lb 6.4 oz (75.5 kg)   SpO2 97%   BMI 26.86 kg/m    General: NAD, pleasant  Cardio: RRR, no MRG. Respiratory: CTAB, normal wob on RA Skin: Warm and dry  ASSESSMENT/PLAN:   Assessment & Plan Hypertension, unspecified type Poorly controlled.  Asymptomatic today. - Increase amlodipine -valsartan  to 10 mg - 320 mg, prescription sent - Follow-up in 1-2 weeks, will check metabolic panel at that time Erectile dysfunction, unspecified erectile dysfunction type -Discussed how blood pressure control may assist -Refill sildenafil    Gladis Church, DO [facility]

## 2023-10-10 NOTE — Assessment & Plan Note (Signed)
-  Discussed how blood pressure control may assist -Refill sildenafil

## 2023-10-21 ENCOUNTER — Other Ambulatory Visit: Payer: Self-pay

## 2023-10-21 MED ORDER — VALACYCLOVIR HCL 500 MG PO TABS: 500.0000 mg | ORAL_TABLET | Freq: Every day | ORAL | 0 refills | Status: DC

## 2023-10-21 MED ORDER — PHENYTOIN SODIUM EXTENDED 100 MG PO CAPS: 300.0000 mg | ORAL_CAPSULE | Freq: Every day | ORAL | 0 refills | Status: DC

## 2023-10-22 ENCOUNTER — Encounter: Payer: Self-pay | Admitting: Student

## 2023-10-22 ENCOUNTER — Ambulatory Visit: Admitting: Student

## 2023-10-22 VITALS — BP 164/72 | HR 92 | Ht 66.0 in | Wt 164.0 lb

## 2023-10-22 DIAGNOSIS — I358 Other nonrheumatic aortic valve disorders: Secondary | ICD-10-CM | POA: Diagnosis not present

## 2023-10-22 DIAGNOSIS — I10 Essential (primary) hypertension: Secondary | ICD-10-CM

## 2023-10-22 NOTE — Patient Instructions (Addendum)
 It was great to see you! Thank you for allowing me to participate in your care!   I recommend that you always bring your medications to each appointment as this makes it easy to ensure we are on the correct medications and helps us  not miss when refills are needed.  Our plans for today:  - We have ordered an echocardiogram.  Once it has been improved by insurance, they can call you to get scheduled. - Continue your current medications - We are checking some labs today, I will call you if they are abnormal will send you a MyChart message or a letter if they are normal.  If you do not hear about your labs in the next 2 weeks please let us  know. - Follow-up in 2 to 4 weeks - If you develop significant shortness of breath, severe chest pain please go to the emergency room  Take care and seek immediate care sooner if you develop any concerns. Please remember to show up 15 minutes before your scheduled appointment time!  Gladis Church, DO [facility]

## 2023-10-22 NOTE — Assessment & Plan Note (Signed)
 Still poorly controlled.  However in setting of new murmur, will not increase medication at this time.  He will follow-up in 2 weeks. - Continue metoprolol  50 mg by mouth daily - Continue amlodipine -valsartan  10-320 milligram daily - Consider starting third agent, HCTZ versus MRA

## 2023-10-22 NOTE — Progress Notes (Signed)
    SUBJECTIVE:   CHIEF COMPLAINT / HPI:   Hypertension No CP, dizzness, SOB. Has been exercising. Adherent to new medication without side-effects.  Murmur Reviewed chart, does not appear to have prior murmur documented.  Reviewed my last note, did not mention murmur at that time nor do I believe I heard one.  Today patient has a systolic murmur, 2/6 best heard over aorta.  I do suspect aortic stenosis, however he has not had an echocardiogram since 2015.  He is agreeable to updating this today.  He is otherwise completely asymptomatic. He saw cardiology in 2015, for irregular heartbeat with PACs on EKG, he has been on metoprolol  ever since. Additionally, on echocardiogram in 2015 did show a mildly dilated aortic root, recommendation was repeat echo 1 year-he did not have this completed.  He should also have echocardiogram for this reason.  He also had a mildly leaky AV.  OBJECTIVE:   BP (!) 164/72   Pulse 92   Ht 5' 6 (1.676 m)   Wt 164 lb (74.4 kg)   SpO2 98%   BMI 26.47 kg/m    General: NAD, pleasant Cardio: RRR, no MRG. Cap Refill <2s. Respiratory: CTAB, normal wob on RA GI: Abdomen is soft, not tender, not distended. BS present Skin: Warm and dry  ASSESSMENT/PLAN:   Assessment & Plan Hypertension, unspecified type Still poorly controlled.  However in setting of new murmur, will not increase medication at this time.  He will follow-up in 2 weeks. - Continue metoprolol  50 mg by mouth daily - Continue amlodipine -valsartan  10-320 milligram daily - Consider starting third agent, HCTZ versus MRA Systolic murmur of aorta - Asymptomatic and hemodynamic stable in office today, no indication for ED evaluation - Overdue for echocardiogram related to his dilated aorta - Suspect aortic stenosis - Echocardiogram ordered - ED precautions discussed - Consider referral to cardiology pending results   Gladis Church, DO [facility]

## 2023-10-23 ENCOUNTER — Ambulatory Visit: Payer: Self-pay | Admitting: Student

## 2023-10-23 LAB — BASIC METABOLIC PANEL WITH GFR
BUN/Creatinine Ratio: 8 — ABNORMAL LOW (ref 10–24)
BUN: 8 mg/dL (ref 8–27)
CO2: 20 mmol/L (ref 20–29)
Calcium: 9.9 mg/dL (ref 8.6–10.2)
Chloride: 102 mmol/L (ref 96–106)
Creatinine, Ser: 0.98 mg/dL (ref 0.76–1.27)
Glucose: 102 mg/dL — ABNORMAL HIGH (ref 70–99)
Potassium: 4.3 mmol/L (ref 3.5–5.2)
Sodium: 140 mmol/L (ref 134–144)
eGFR: 76 mL/min/1.73 (ref >59)

## 2023-11-18 ENCOUNTER — Ambulatory Visit (HOSPITAL_COMMUNITY)
Admission: RE | Admit: 2023-11-18 | Discharge: 2023-11-18 | Disposition: A | Discharge status: Home / Self Care | Source: Ambulatory Visit | Attending: Family Medicine | Admitting: Family Medicine

## 2023-11-18 DIAGNOSIS — R002 Palpitations: Secondary | ICD-10-CM | POA: Diagnosis not present

## 2023-11-18 DIAGNOSIS — I340 Nonrheumatic mitral (valve) insufficiency: Secondary | ICD-10-CM | POA: Diagnosis not present

## 2023-11-18 DIAGNOSIS — E785 Hyperlipidemia, unspecified: Secondary | ICD-10-CM | POA: Insufficient documentation

## 2023-11-18 DIAGNOSIS — I358 Other nonrheumatic aortic valve disorders: Secondary | ICD-10-CM | POA: Insufficient documentation

## 2023-11-18 DIAGNOSIS — Z87891 Personal history of nicotine dependence: Secondary | ICD-10-CM | POA: Insufficient documentation

## 2023-11-18 DIAGNOSIS — I371 Nonrheumatic pulmonary valve insufficiency: Secondary | ICD-10-CM | POA: Diagnosis not present

## 2023-11-18 DIAGNOSIS — R011 Cardiac murmur, unspecified: Secondary | ICD-10-CM

## 2023-11-18 DIAGNOSIS — I10 Essential (primary) hypertension: Secondary | ICD-10-CM | POA: Diagnosis not present

## 2023-11-18 DIAGNOSIS — I080 Rheumatic disorders of both mitral and aortic valves: Secondary | ICD-10-CM | POA: Diagnosis not present

## 2023-11-18 LAB — ECHOCARDIOGRAM COMPLETE
Area-P 1/2: 5.84 cm2
S' Lateral: 3 cm

## 2023-11-18 NOTE — Progress Notes (Signed)
  Echocardiogram 2D Echocardiogram has been performed.  Koleen KANDICE Popper, RDCS 11/18/2023, 9:22 AM

## 2023-11-25 ENCOUNTER — Telehealth: Payer: Self-pay

## 2023-11-25 NOTE — Telephone Encounter (Signed)
 Patients daughter calls nurse line requesting Echocardiogram results.   She reports you can call her to discuss.   Advised will forward to PCP.

## 2023-11-27 ENCOUNTER — Telehealth: Payer: Self-pay | Admitting: Student

## 2023-11-27 DIAGNOSIS — I351 Nonrheumatic aortic (valve) insufficiency: Secondary | ICD-10-CM

## 2023-11-27 NOTE — Telephone Encounter (Signed)
 Called patient.  Confirmed identity.  Discussed echocardiogram results.  Discussed aortic regurgitation.  Patient does have some dizziness/shortness of breath on occasion.  Likely for surgical candidate given advanced age, however we will send to cardiology for medical optimization.  Patient gave permission to call daughter.  Because daughter called our clinic asking for results.  I have called daughter, confirmed identity.  Updated her with the plan.  She expressed agreement and understanding with plan.

## 2023-12-01 ENCOUNTER — Ambulatory Visit (INDEPENDENT_AMBULATORY_CARE_PROVIDER_SITE_OTHER): Admitting: Student

## 2023-12-01 ENCOUNTER — Encounter: Payer: Self-pay | Admitting: Student

## 2023-12-01 VITALS — BP 160/60 | HR 91 | Ht 66.0 in | Wt 165.2 lb

## 2023-12-01 DIAGNOSIS — I10 Essential (primary) hypertension: Secondary | ICD-10-CM | POA: Diagnosis not present

## 2023-12-01 MED ORDER — HYDROCHLOROTHIAZIDE 12.5 MG PO CAPS: 12.5000 mg | ORAL_CAPSULE | Freq: Every day | ORAL | 1 refills | Status: DC

## 2023-12-01 NOTE — Patient Instructions (Signed)
 It was great to see you! Thank you for allowing me to participate in your care!   I recommend that you always bring your medications to each appointment as this makes it easy to ensure we are on the correct medications and helps us  not miss when refills are needed.  Our plans for today:   Call this number below to schedule appointment with cardiology to discuss your aortic valve regurgitation and blood pressure [facility] [address] [place], [place] [address] [phone]  Take care and seek immediate care sooner if you develop any concerns. Please remember to show up 15 minutes before your scheduled appointment time!  Gladis Church, DO [facility]

## 2023-12-01 NOTE — Progress Notes (Signed)
    SUBJECTIVE:   CHIEF COMPLAINT / HPI:   Hypertension Patient follow-up for hypertension.  Recent echo with moderate-severe aortic valve regurgitation.  He reports intermittent dizziness, but cannot tell me how often nor how long it lasts and he does not seem to be concerned about it.  Denies chest pain, shortness of breath.  Reports adherence to Exforge  and metoprolol .  OBJECTIVE:   BP (!) 160/60   Pulse 91   Ht 5' 6 (1.676 m)   Wt 165 lb 4 oz (75 kg)   SpO2 98%   BMI 26.67 kg/m    General: NAD, pleasant Cardio: RRR, no MRG. Respiratory: CTAB, normal wob on RA Skin: Warm and dry  ASSESSMENT/PLAN:   Assessment & Plan Hypertension, unspecified type Due to widened pulse pressure, with low normal diastolic and recent echo findings-will not add additional blood pressure medication at this time.  Recommend follow-up with cardiology, for medication optimization. - Follow-up with cardiology, number provided, referral already sent - Could consider low-dose hydrochlorothiazide    Gladis Church, DO [facility]

## 2023-12-01 NOTE — Assessment & Plan Note (Signed)
 Due to widened pulse pressure, with low normal diastolic and recent echo findings-will not add additional blood pressure medication at this time.  Recommend follow-up with cardiology, for medication optimization. - Follow-up with cardiology, number provided, referral already sent - Could consider low-dose hydrochlorothiazide 

## 2023-12-06 ENCOUNTER — Other Ambulatory Visit: Payer: Self-pay | Admitting: Student

## 2023-12-06 DIAGNOSIS — I10 Essential (primary) hypertension: Secondary | ICD-10-CM

## 2024-01-10 ENCOUNTER — Other Ambulatory Visit: Payer: Self-pay | Admitting: Student

## 2024-01-25 ENCOUNTER — Other Ambulatory Visit: Payer: Self-pay | Admitting: Student

## 2024-01-25 DIAGNOSIS — I10 Essential (primary) hypertension: Secondary | ICD-10-CM

## 2024-01-30 ENCOUNTER — Ambulatory Visit: Attending: Internal Medicine | Admitting: Internal Medicine

## 2024-01-30 ENCOUNTER — Encounter: Payer: Self-pay | Admitting: Internal Medicine

## 2024-01-30 VITALS — BP 179/48 | HR 96 | Ht 66.0 in | Wt 163.0 lb

## 2024-01-30 DIAGNOSIS — I340 Nonrheumatic mitral (valve) insufficiency: Secondary | ICD-10-CM | POA: Diagnosis not present

## 2024-01-30 DIAGNOSIS — I351 Nonrheumatic aortic (valve) insufficiency: Secondary | ICD-10-CM | POA: Insufficient documentation

## 2024-01-30 DIAGNOSIS — I358 Other nonrheumatic aortic valve disorders: Secondary | ICD-10-CM | POA: Diagnosis present

## 2024-01-30 DIAGNOSIS — I10 Essential (primary) hypertension: Secondary | ICD-10-CM | POA: Diagnosis present

## 2024-01-30 DIAGNOSIS — R002 Palpitations: Secondary | ICD-10-CM | POA: Diagnosis not present

## 2024-01-30 DIAGNOSIS — R0609 Other forms of dyspnea: Secondary | ICD-10-CM | POA: Insufficient documentation

## 2024-01-30 DIAGNOSIS — I371 Nonrheumatic pulmonary valve insufficiency: Secondary | ICD-10-CM | POA: Diagnosis not present

## 2024-01-30 LAB — CBC

## 2024-01-30 MED ORDER — METOPROLOL SUCCINATE ER 50 MG PO TB24: 25.0000 mg | ORAL_TABLET | Freq: Every day | ORAL | 3 refills | Status: DC

## 2024-01-30 NOTE — H&P (View-Only) (Signed)
 Cardiology Office Note   Date:  01/30/2024  ID:  Phillip Hill, DOB 04/08/1940, MRN [id number] PCP: Howell Lunger, DO  [facility] Providers Cardiologist:  None     History of Present Illness Phillip Hill is a 83 y.o. male with a past medical history of s/p right upper extremity amputation remote posttrauma, hypertension, dilated aortic root, moderate concentric LVH more pronounced in the basal septum, moderate to severe aortic regurgitation, moderate pulmonic regurgitation presents today to discuss aortic regurgitation after referral from his PCP.  He has been having exertional shortness of breath and in the past has had episodes of orthopnea but nothing recently.  Denies chest pain.  He does not have any swelling in his legs.  He is most concerned with is his blood pressure control which has been difficult to control lately.  He states he has a very high salt intake.  He will eat fast food at least once per week, eats country fried ham and pork chops and fried food throughout the week.  He also add salt to his food.  Tobacco use: No Alcohol use: No Diet mainly consists of: High salt intake as above    ROS:  Review of Systems  All other systems reviewed and are negative.   Physical Exam  Physical Exam Vitals and nursing note reviewed.  Constitutional:      Appearance: Normal appearance.  HENT:     Head: Normocephalic and atraumatic.  Eyes:     Conjunctiva/sclera: Conjunctivae normal.  Neck:     Vascular: No carotid bruit.  Cardiovascular:     Rate and Rhythm: Normal rate and regular rhythm.     Heart sounds: Murmur heard.     Systolic murmur is present with a grade of 2/6.  Pulmonary:     Effort: Pulmonary effort is normal.     Breath sounds: Normal breath sounds.  Musculoskeletal:        General: No swelling or tenderness.     Comments: Right arm prosthesis  Skin:    Coloration: Skin is not jaundiced or pale.  Neurological:     Mental Status: He is alert.      VS:  BP (!) 179/48   Pulse 96   Ht 5' 6 (1.676 m)   Wt 163 lb (73.9 kg)   SpO2 94%   BMI 26.31 kg/m         Wt Readings from Last 3 Encounters:  01/30/24 163 lb (73.9 kg)  12/01/23 165 lb 4 oz (75 kg)  10/22/23 164 lb (74.4 kg)     EKG Interpretation Date/Time:  Friday January 30 2024 08:48:23 EST Ventricular Rate:  96 PR Interval:  222 QRS Duration:  70 QT Interval:  326 QTC Calculation: 411 R Axis:   11  Text Interpretation: Sinus rhythm with 1st degree A-V block with frequent Premature ventricular complexes Septal infarct , age undetermined Nonspecific ST abnormality When compared with ECG of 28-Mar-2021 05:53, Premature ventricular complexes are now Present Abberant conduction is no longer Present Septal infarct is now Present Confirmed by Kriste Hicks [phone]) on 01/30/2024 9:17:48 AM    Studies Reviewed   Echocardiogram 11/18/2023:   1. Moderate hypertrophy of the basal septum with otherwise mild  concentric LVH. Left ventricular ejection fraction, by estimation, is 50  to 55%. Left ventricular ejection fraction by 3D volume is 54 %. The left  ventricle has low normal function. The  left ventricle has no regional wall motion abnormalities. There is  moderate asymmetric  left ventricular hypertrophy of the basal-septal  segment. Left ventricular diastolic parameters are consistent with Grade I  diastolic dysfunction (impaired  relaxation). The average left ventricular global longitudinal strain is  -14.4 %. The global longitudinal strain is abnormal.   2. Right ventricular systolic function is normal. The right ventricular  size is normal. There is normal pulmonary artery systolic pressure.   3. The mitral valve is normal in structure. Mild mitral valve  regurgitation. No evidence of mitral stenosis.   4. Aortic valve leaflets do not fully coapt. Aortic regurgitation  pressure half time 293 ms. The aortic valve is tricuspid. Aortic valve  regurgitation is  moderate to severe. No aortic stenosis is present.   5. Pulmonic valve regurgitation is moderate.   6. Aortic dilatation noted. There is mild dilatation of the aortic root,  measuring 41 mm.   7. The inferior vena cava is normal in size with greater than 50%  respiratory variability, suggesting right atrial pressure of 3 mmHg.       Risk Assessment/Calculations         ASCVD risk score: The ASCVD Risk score (Arnett DK, et al., 2019) failed to calculate for the following reasons:   The 2019 ASCVD risk score is only valid for ages 59 to 12   ASSESSMENT  Moderate-severe aortic insufficiency echo report poor coaptation of aortic valve leaflets with eccentric regurgitation and a pressure half-time of 293 ms.  Based on end-diastolic volume/BSA index he has mild end-diastolic LV dilatation with a vena contracta of 0.8 (potentially slightly overestimated on personal review). EF is 54% by 3D assessment. All of this leans toward likely severe grade aortic insufficiency.  Currently not volume overloaded or in heart failure. Poorly controlled hypertension BP 179/48 and has been chronically elevated now with wide pulse pressure likely related to his aortic insufficiency.  Currently on amlodipine -valsartan  10/320 mg daily, hydrochlorothiazide  12.5 mg (recently started), chronic metoprolol  succinate 50 mg daily.  He has a very high salt diet which is going to make management difficult especially in the setting of moderate to severe aortic insufficiency. Mild mitral regurgitation Moderate pulmonic regurgitation Mild aortic root dilatation 41 mm by echocardiogram Exertional shortness of breath likely related to the above issues.  Unknown if he has underlying CAD.     Plan  Will have the patient set up for TEE to for better evaluation of his aortic valve - no absolute contraindications.   Plan will likely be made to pursue AVR following the procedure and thus he will need a right and left heart  catheterization if that is the case He has been on chronic metoprolol  succinate, will reduce to 25 mg in the setting of suspected severe aortic regurgitation but not discontinue to prevent withdrawal He recently started on hydrochlorothiazide  He was strongly advised to adhere to a restricted salt intake diet with goal less than 2000 mg of salt per day.  Will provide information on the DASH diet He is going to monitor his blood pressure and bring his BP log at his next visit Can consider changing valsartan  to Entresto and increasing HCTZ  Follow up: 4 to 6 weeks or sooner pending further workup as above     Informed Consent   Shared Decision Making/Informed Consent   The risks [esophageal damage, perforation (1:10,000 risk), bleeding, pharyngeal hematoma as well as other potential complications associated with conscious sedation including aspiration, arrhythmia, respiratory failure and death], benefits (treatment guidance and diagnostic support) and alternatives of a transesophageal echocardiogram  were discussed in detail with Mr. Beville and he is willing to proceed.        Signed, Emeline FORBES Calender, MD

## 2024-01-30 NOTE — Progress Notes (Signed)
 Cardiology Office Note   Date:  01/30/2024  ID:  Phillip Hill, DOB 04/08/1940, MRN [id number] PCP: Howell Lunger, DO  [facility] Providers Cardiologist:  None     History of Present Illness Phillip Hill is a 83 y.o. male with a past medical history of s/p right upper extremity amputation remote posttrauma, hypertension, dilated aortic root, moderate concentric LVH more pronounced in the basal septum, moderate to severe aortic regurgitation, moderate pulmonic regurgitation presents today to discuss aortic regurgitation after referral from his PCP.  He has been having exertional shortness of breath and in the past has had episodes of orthopnea but nothing recently.  Denies chest pain.  He does not have any swelling in his legs.  He is most concerned with is his blood pressure control which has been difficult to control lately.  He states he has a very high salt intake.  He will eat fast food at least once per week, eats country fried ham and pork chops and fried food throughout the week.  He also add salt to his food.  Tobacco use: No Alcohol use: No Diet mainly consists of: High salt intake as above    ROS:  Review of Systems  All other systems reviewed and are negative.   Physical Exam  Physical Exam Vitals and nursing note reviewed.  Constitutional:      Appearance: Normal appearance.  HENT:     Head: Normocephalic and atraumatic.  Eyes:     Conjunctiva/sclera: Conjunctivae normal.  Neck:     Vascular: No carotid bruit.  Cardiovascular:     Rate and Rhythm: Normal rate and regular rhythm.     Heart sounds: Murmur heard.     Systolic murmur is present with a grade of 2/6.  Pulmonary:     Effort: Pulmonary effort is normal.     Breath sounds: Normal breath sounds.  Musculoskeletal:        General: No swelling or tenderness.     Comments: Right arm prosthesis  Skin:    Coloration: Skin is not jaundiced or pale.  Neurological:     Mental Status: He is alert.      VS:  BP (!) 179/48   Pulse 96   Ht 5' 6 (1.676 m)   Wt 163 lb (73.9 kg)   SpO2 94%   BMI 26.31 kg/m         Wt Readings from Last 3 Encounters:  01/30/24 163 lb (73.9 kg)  12/01/23 165 lb 4 oz (75 kg)  10/22/23 164 lb (74.4 kg)     EKG Interpretation Date/Time:  Friday January 30 2024 08:48:23 EST Ventricular Rate:  96 PR Interval:  222 QRS Duration:  70 QT Interval:  326 QTC Calculation: 411 R Axis:   11  Text Interpretation: Sinus rhythm with 1st degree A-V block with frequent Premature ventricular complexes Septal infarct , age undetermined Nonspecific ST abnormality When compared with ECG of 28-Mar-2021 05:53, Premature ventricular complexes are now Present Abberant conduction is no longer Present Septal infarct is now Present Confirmed by Kriste Hicks [phone]) on 01/30/2024 9:17:48 AM    Studies Reviewed   Echocardiogram 11/18/2023:   1. Moderate hypertrophy of the basal septum with otherwise mild  concentric LVH. Left ventricular ejection fraction, by estimation, is 50  to 55%. Left ventricular ejection fraction by 3D volume is 54 %. The left  ventricle has low normal function. The  left ventricle has no regional wall motion abnormalities. There is  moderate asymmetric  left ventricular hypertrophy of the basal-septal  segment. Left ventricular diastolic parameters are consistent with Grade I  diastolic dysfunction (impaired  relaxation). The average left ventricular global longitudinal strain is  -14.4 %. The global longitudinal strain is abnormal.   2. Right ventricular systolic function is normal. The right ventricular  size is normal. There is normal pulmonary artery systolic pressure.   3. The mitral valve is normal in structure. Mild mitral valve  regurgitation. No evidence of mitral stenosis.   4. Aortic valve leaflets do not fully coapt. Aortic regurgitation  pressure half time 293 ms. The aortic valve is tricuspid. Aortic valve  regurgitation is  moderate to severe. No aortic stenosis is present.   5. Pulmonic valve regurgitation is moderate.   6. Aortic dilatation noted. There is mild dilatation of the aortic root,  measuring 41 mm.   7. The inferior vena cava is normal in size with greater than 50%  respiratory variability, suggesting right atrial pressure of 3 mmHg.       Risk Assessment/Calculations         ASCVD risk score: The ASCVD Risk score (Arnett DK, et al., 2019) failed to calculate for the following reasons:   The 2019 ASCVD risk score is only valid for ages 59 to 12   ASSESSMENT  Moderate-severe aortic insufficiency echo report poor coaptation of aortic valve leaflets with eccentric regurgitation and a pressure half-time of 293 ms.  Based on end-diastolic volume/BSA index he has mild end-diastolic LV dilatation with a vena contracta of 0.8 (potentially slightly overestimated on personal review). EF is 54% by 3D assessment. All of this leans toward likely severe grade aortic insufficiency.  Currently not volume overloaded or in heart failure. Poorly controlled hypertension BP 179/48 and has been chronically elevated now with wide pulse pressure likely related to his aortic insufficiency.  Currently on amlodipine -valsartan  10/320 mg daily, hydrochlorothiazide  12.5 mg (recently started), chronic metoprolol  succinate 50 mg daily.  He has a very high salt diet which is going to make management difficult especially in the setting of moderate to severe aortic insufficiency. Mild mitral regurgitation Moderate pulmonic regurgitation Mild aortic root dilatation 41 mm by echocardiogram Exertional shortness of breath likely related to the above issues.  Unknown if he has underlying CAD.     Plan  Will have the patient set up for TEE to for better evaluation of his aortic valve - no absolute contraindications.   Plan will likely be made to pursue AVR following the procedure and thus he will need a right and left heart  catheterization if that is the case He has been on chronic metoprolol  succinate, will reduce to 25 mg in the setting of suspected severe aortic regurgitation but not discontinue to prevent withdrawal He recently started on hydrochlorothiazide  He was strongly advised to adhere to a restricted salt intake diet with goal less than 2000 mg of salt per day.  Will provide information on the DASH diet He is going to monitor his blood pressure and bring his BP log at his next visit Can consider changing valsartan  to Entresto and increasing HCTZ  Follow up: 4 to 6 weeks or sooner pending further workup as above     Informed Consent   Shared Decision Making/Informed Consent   The risks [esophageal damage, perforation (1:10,000 risk), bleeding, pharyngeal hematoma as well as other potential complications associated with conscious sedation including aspiration, arrhythmia, respiratory failure and death], benefits (treatment guidance and diagnostic support) and alternatives of a transesophageal echocardiogram  were discussed in detail with Mr. Beville and he is willing to proceed.        Signed, Emeline FORBES Calender, MD

## 2024-01-30 NOTE — Patient Instructions (Addendum)
 Medication Instructions:  Reducing Metoprolol  25 mg daily   *If you need a refill on your cardiac medications before your next appointment, please call your pharmacy*  Lab Work: BMET & CBC   Testing/Procedures: Your physician has requested that you have a TEE. During a TEE, sound waves are used to create images of your heart. It provides your doctor with information about the size and shape of your heart and how well your heart's chambers and valves are working. In this test, a transducer is attached to the end of a flexible tube that's guided down your throat and into your esophagus (the tube leading from you mouth to your stomach) to get a more detailed image of your heart. You are not awake for the procedure. Please see the instruction sheet given to you today. For further information please visit [email or web address]    Follow-Up: At [facility], you and your health needs are our priority.  As part of our continuing mission to provide you with exceptional heart care, our providers are all part of one team.  This team includes your primary Cardiologist (physician) and Advanced Practice Providers or APPs (Physician Assistants and Nurse Practitioners) who all work together to provide you with the care you need, when you need it.  Your next appointment:   4-6 week(s) post TEE  Provider:   Dr. Kriste   We recommend signing up for the patient portal called MyChart.  Sign up information is provided on this After Visit Summary.  MyChart is used to connect with patients for Virtual Visits (Telemedicine).  Patients are able to view lab/test results, encounter notes, upcoming appointments, etc.  Non-urgent messages can be sent to your provider as well.   To learn more about what you can do with MyChart, go to [email or web address].   Other Instructions     Dear Phillip Hill  You are scheduled for a TEE (Transesophageal Echocardiogram) on Tuesday, November 11 with Dr. Floretta.  Please  arrive at the [facility] (Main Entrance A) at [facility]: [address] [place], [place] [address] at 11:00 AM (This time is 1 hour(s) before your procedure to ensure your preparation). Your procedure is scheduled to begin at 12 PM.  Free valet parking service is available. You will check in at ADMITTING.   *Please Note: You will receive a call the day before your procedure to confirm the appointment time. That time may have changed from the original time based on the schedule for that day.*    DIET:  Nothing to eat or drink after midnight except a sip of water  with medications (see medication instructions below)  MEDICATION INSTRUCTIONS: !!IF ANY NEW MEDICATIONS ARE STARTED AFTER TODAY, PLEASE NOTIFY YOUR PROVIDER AS SOON AS POSSIBLE!!  FYI: Medications such as Semaglutide (Ozempic, Wegovy), Tirzepatide (Mounjaro, Zepbound), Dulaglutide (Trulicity), etc (GLP1 agonists) AND Canagliflozin (Invokana), Dapagliflozin (Farxiga), Empagliflozin (Jardiance), Ertugliflozin (Steglatro), Bexagliflozin [employer]) or any combination with one of these drugs such as Invokamet (Canagliflozin/Metformin), Synjardy (Empagliflozin/Metformin), etc (SGLT2 inhibitors) must be held around the time of a procedure. This is not a comprehensive list of all of these drugs. Please review all of your medications and talk to your provider if you take any one of these. If you are not sure, ask your provider.     LABS:  Completed today   FYI:  For your safety, and to allow us  to monitor your vital signs accurately during the surgery/procedure we request: If you have artificial nails, gel coating, SNS etc,  please have those removed prior to your surgery/procedure. Not having the nail coverings /polish removed may result in cancellation or delay of your surgery/procedure.  Your support person will be asked to wait in the waiting room during your procedure.  It is OK to have someone drop you off and come back when  you are ready to be discharged.  You cannot drive after the procedure and will need someone to drive you home.  Bring your insurance cards.  *Special Note: Every effort is made to have your procedure done on time. Occasionally there are emergencies that occur at the hospital that may cause delays. Please be patient if a delay does occur.        Heart-Healthy Eating Plan Eating a healthy diet is important for the health of your heart. A heart-healthy eating plan includes: Eating less unhealthy fats. Eating more healthy fats. Eating less salt in your food. Salt is also called sodium. Making other changes in your diet. Talk with your doctor or a diet specialist (dietitian) to create an eating plan that is right for you. What is my plan? Your doctor may recommend an eating plan that includes: Total fat: ______% or less of total calories a day. Saturated fat: ______% or less of total calories a day. Cholesterol: less than _________mg a day. Sodium: less than _________mg a day. What are tips for following this plan? Cooking Avoid frying your food. Try to bake, boil, grill, or broil it instead. You can also reduce fat by: Removing the skin from poultry. Removing all visible fats from meats. Steaming vegetables in water  or broth. Meal planning  At meals, divide your plate into four equal parts: Fill one-half of your plate with vegetables and green salads. Fill one-fourth of your plate with whole grains. Fill one-fourth of your plate with lean protein foods. Eat 2-4 cups of vegetables per day. One cup of vegetables is: 1 cup (91 g) broccoli or cauliflower florets. 2 medium carrots. 1 large bell pepper. 1 large sweet potato. 1 large tomato. 1 medium white potato. 2 cups (150 g) raw leafy greens. Eat 1-2 cups of fruit per day. One cup of fruit is: 1 small apple 1 large banana 1 cup (237 g) mixed fruit, 1 large orange,  cup (82 g) dried fruit, 1 cup (240 mL) 100% fruit  juice. Eat more foods that have soluble fiber. These are apples, broccoli, carrots, beans, peas, and barley. Try to get 20-30 g of fiber per day. Eat 4-5 servings of nuts, legumes, and seeds per week: 1 serving of dried beans or legumes equals  cup (90 g) cooked. 1 serving of nuts is  oz (12 almonds, 24 pistachios, or 7 walnut halves). 1 serving of seeds equals  oz (8 g). General information Eat more home-cooked food. Eat less restaurant, buffet, and fast food. Limit or avoid alcohol. Limit foods that are high in starch and sugar. Avoid fried foods. Lose weight if you are overweight. Keep track of how much salt (sodium) you eat. This is important if you have high blood pressure. Ask your doctor to tell you more about this. Try to add vegetarian meals each week. Fats Choose healthy fats. These include olive oil and canola oil, flaxseeds, walnuts, almonds, and seeds. Eat more omega-3 fats. These include salmon, mackerel, sardines, tuna, flaxseed oil, and ground flaxseeds. Try to eat fish at least 2 times each week. Check food labels. Avoid foods with trans fats or high amounts of saturated fat. Limit saturated fats.  These are often found in animal products, such as meats, butter, and cream. These are also found in plant foods, such as palm oil, palm kernel oil, and coconut oil. Avoid foods with partially hydrogenated oils in them. These have trans fats. Examples are stick margarine, some tub margarines, cookies, crackers, and other baked goods. What foods should I eat? Fruits All fresh, canned (in natural juice), or frozen fruits. Vegetables Fresh or frozen vegetables (raw, steamed, roasted, or grilled). Green salads. Grains Most grains. Choose whole wheat and whole grains most of the time. Rice and pasta, including brown rice and pastas made with whole wheat. Meats and other proteins Lean, well-trimmed beef, veal, pork, and lamb. Chicken and turkey without skin. All fish and  shellfish. Wild duck, rabbit, pheasant, and venison. Egg whites or low-cholesterol egg substitutes. Dried beans, peas, lentils, and tofu. Seeds and most nuts. Dairy Low-fat or nonfat cheeses, including ricotta and mozzarella. Skim or 1% milk that is liquid, powdered, or evaporated. Buttermilk that is made with low-fat milk. Nonfat or low-fat yogurt. Fats and oils Non-hydrogenated (trans-free) margarines. Vegetable oils, including soybean, sesame, sunflower, olive, peanut, safflower, corn, canola, and cottonseed. Salad dressings or mayonnaise made with a vegetable oil. Beverages Mineral water . Coffee and tea. Diet carbonated beverages. Sweets and desserts Sherbet, gelatin, and fruit ice. Small amounts of dark chocolate. Limit all sweets and desserts. Seasonings and condiments All seasonings and condiments. The items listed above may not be a complete list of foods and drinks you can eat. Contact a dietitian for more options. What foods should I avoid? Fruits Canned fruit in heavy syrup. Fruit in cream or butter sauce. Fried fruit. Limit coconut. Vegetables Vegetables cooked in cheese, cream, or butter sauce. Fried vegetables. Grains Breads that are made with saturated or trans fats, oils, or whole milk. Croissants. Sweet rolls. Donuts. High-fat crackers, such as cheese crackers. Meats and other proteins Fatty meats, such as hot dogs, ribs, sausage, bacon, rib-eye roast or steak. High-fat deli meats, such as salami and bologna. Caviar. Domestic duck and goose. Organ meats, such as liver. Dairy Cream, sour cream, cream cheese, and creamed cottage cheese. Whole-milk cheeses. Whole or 2% milk that is liquid, evaporated, or condensed. Whole buttermilk. Cream sauce or high-fat cheese sauce. Yogurt that is made from whole milk. Fats and oils Meat fat, or shortening. Cocoa butter, hydrogenated oils, palm oil, coconut oil, palm kernel oil. Solid fats and shortenings, including bacon fat, salt pork,  lard, and butter. Nondairy cream substitutes. Salad dressings with cheese or sour cream. Beverages Regular sodas and juice drinks with added sugar. Sweets and desserts Frosting. Pudding. Cookies. Cakes. Pies. Milk chocolate or white chocolate. Buttered syrups. Full-fat ice cream or ice cream drinks. The items listed above may not be a complete list of foods and drinks to avoid. Contact a dietitian for more information. Summary Heart-healthy meal planning includes eating less unhealthy fats, eating more healthy fats, and making other changes in your diet. Eat a balanced diet. This includes fruits and vegetables, low-fat or nonfat dairy, lean protein, nuts and legumes, whole grains, and heart-healthy oils and fats. This information is not intended to replace advice given to you by your health care provider. Make sure you discuss any questions you have with your health care provider. Document Revised: 04/16/2021 Document Reviewed: 04/16/2021 Elsevier Patient Education  2024 [employer].

## 2024-01-31 LAB — BASIC METABOLIC PANEL WITH GFR
BUN/Creatinine Ratio: 15 (ref 10–24)
BUN: 20 mg/dL (ref 8–27)
CO2: 21 mmol/L (ref 20–29)
Calcium: 10 mg/dL (ref 8.6–10.2)
Chloride: 99 mmol/L (ref 96–106)
Creatinine, Ser: 1.33 mg/dL — ABNORMAL HIGH (ref 0.76–1.27)
Glucose: 116 mg/dL — ABNORMAL HIGH (ref 70–99)
Potassium: 4.2 mmol/L (ref 3.5–5.2)
Sodium: 137 mmol/L (ref 134–144)
eGFR: 53 mL/min/1.73 — ABNORMAL LOW (ref >59)

## 2024-01-31 LAB — CBC
Hematocrit: 39 % (ref 37.5–51.0)
Hemoglobin: 13.7 g/dL (ref 13.0–17.7)
MCH: 33.8 pg — ABNORMAL HIGH (ref 26.6–33.0)
MCHC: 35.1 g/dL (ref 31.5–35.7)
MCV: 96 fL (ref 79–97)
Platelets: 296 x10E3/uL (ref 150–450)
RBC: 4.05 x10E6/uL — ABNORMAL LOW (ref 4.14–5.80)
RDW: 12.2 % (ref 11.6–15.4)
WBC: 5.5 x10E3/uL (ref 3.4–10.8)

## 2024-02-02 ENCOUNTER — Ambulatory Visit: Payer: Self-pay | Admitting: Internal Medicine

## 2024-02-02 NOTE — Progress Notes (Signed)
 Spoke to patient and instructed them to come at 1115  and to be NPO after 0000.     Confirmed that patient will have a ride home and someone to stay with them for 24 hours after the procedure.   Medications reviewed.

## 2024-02-03 ENCOUNTER — Encounter (HOSPITAL_COMMUNITY)
Admission: RE | Disposition: A | Discharge status: Home / Self Care | Payer: Self-pay | Source: Home / Self Care | Attending: Student in an Organized Health Care Education/Training Program

## 2024-02-03 ENCOUNTER — Ambulatory Visit (HOSPITAL_COMMUNITY)
Admission: RE | Admit: 2024-02-03 | Discharge: 2024-02-03 | Disposition: A | Source: Ambulatory Visit | Attending: Student in an Organized Health Care Education/Training Program | Admitting: Student in an Organized Health Care Education/Training Program

## 2024-02-03 ENCOUNTER — Other Ambulatory Visit: Payer: Self-pay | Admitting: Student

## 2024-02-03 ENCOUNTER — Encounter (HOSPITAL_COMMUNITY): Payer: Self-pay | Admitting: Student in an Organized Health Care Education/Training Program

## 2024-02-03 ENCOUNTER — Other Ambulatory Visit: Payer: Self-pay

## 2024-02-03 ENCOUNTER — Ambulatory Visit (HOSPITAL_COMMUNITY)
Admission: RE | Admit: 2024-02-03 | Discharge: 2024-02-03 | Disposition: A | Discharge status: Home / Self Care | Attending: Student in an Organized Health Care Education/Training Program | Admitting: Student in an Organized Health Care Education/Training Program

## 2024-02-03 ENCOUNTER — Ambulatory Visit (HOSPITAL_COMMUNITY)

## 2024-02-03 DIAGNOSIS — E785 Hyperlipidemia, unspecified: Secondary | ICD-10-CM | POA: Diagnosis not present

## 2024-02-03 DIAGNOSIS — Z87891 Personal history of nicotine dependence: Secondary | ICD-10-CM

## 2024-02-03 DIAGNOSIS — Z79899 Other long term (current) drug therapy: Secondary | ICD-10-CM | POA: Insufficient documentation

## 2024-02-03 DIAGNOSIS — I10 Essential (primary) hypertension: Secondary | ICD-10-CM | POA: Diagnosis not present

## 2024-02-03 DIAGNOSIS — Z89201 Acquired absence of right upper limb, unspecified level: Secondary | ICD-10-CM | POA: Diagnosis not present

## 2024-02-03 DIAGNOSIS — I083 Combined rheumatic disorders of mitral, aortic and tricuspid valves: Secondary | ICD-10-CM | POA: Diagnosis not present

## 2024-02-03 DIAGNOSIS — I3139 Other pericardial effusion (noninflammatory): Secondary | ICD-10-CM | POA: Insufficient documentation

## 2024-02-03 DIAGNOSIS — I351 Nonrheumatic aortic (valve) insufficiency: Secondary | ICD-10-CM | POA: Diagnosis not present

## 2024-02-03 DIAGNOSIS — I77810 Thoracic aortic ectasia: Secondary | ICD-10-CM | POA: Insufficient documentation

## 2024-02-03 HISTORY — PX: TRANSESOPHAGEAL ECHOCARDIOGRAM (CATH LAB): EP1270

## 2024-02-03 LAB — ECHO TEE: Est EF: 55

## 2024-02-03 SURGERY — TRANSESOPHAGEAL ECHOCARDIOGRAM (TEE) (CATHLAB)
Anesthesia: Monitor Anesthesia Care

## 2024-02-03 MED ORDER — BUTAMBEN-TETRACAINE-BENZOCAINE 2-2-14 % EX AERO
INHALATION_SPRAY | CUTANEOUS | Status: DC | PRN
Start: 2024-02-03 — End: 2024-02-03
  Administered 2024-02-03: 1 via TOPICAL

## 2024-02-03 MED ORDER — PROPOFOL 10 MG/ML IV BOLUS
INTRAVENOUS | Status: DC | PRN
  Administered 2024-02-03: 30 mg via INTRAVENOUS
  Administered 2024-02-03: 20 mg via INTRAVENOUS

## 2024-02-03 MED ORDER — PHENYLEPHRINE 80 MCG/ML (10ML) SYRINGE FOR IV PUSH (FOR BLOOD PRESSURE SUPPORT)
PREFILLED_SYRINGE | INTRAVENOUS | Status: DC | PRN
  Administered 2024-02-03 (×2): 160 ug via INTRAVENOUS

## 2024-02-03 MED ORDER — PROPOFOL 500 MG/50ML IV EMUL
INTRAVENOUS | Status: DC | PRN
  Administered 2024-02-03: 75 ug/kg/min via INTRAVENOUS

## 2024-02-03 MED ORDER — SODIUM CHLORIDE 0.9 % IV SOLN
INTRAVENOUS | Status: DC | PRN
Start: 2024-02-03 — End: 2024-02-03

## 2024-02-03 NOTE — Anesthesia Postprocedure Evaluation (Signed)
 Anesthesia Post Note  Patient: Phillip Hill  Procedure(s) Performed: TRANSESOPHAGEAL ECHOCARDIOGRAM     Patient location during evaluation: PACU Anesthesia Type: MAC Level of consciousness: awake and alert Pain management: pain level controlled Vital Signs Assessment: post-procedure vital signs reviewed and stable Respiratory status: spontaneous breathing, nonlabored ventilation, respiratory function stable and patient connected to nasal cannula oxygen Cardiovascular status: stable and blood pressure returned to baseline Postop Assessment: no apparent nausea or vomiting Anesthetic complications: no   No notable events documented.  Last Vitals:  Vitals:   02/03/24 1314 02/03/24 1324  BP: 90/60 117/68  Pulse: 82 91  Resp: 15 19  Temp:    SpO2: 98% 97%    Last Pain:  Vitals:   02/03/24 1324  TempSrc:   PainSc: 0-No pain                 Thom JONELLE Peoples

## 2024-02-03 NOTE — CV Procedure (Signed)
    TRANSESOPHAGEAL ECHOCARDIOGRAM   NAME:  Phillip Hill    MRN: [id number] DOB:  [birth year]    ADMIT DATE: 02/03/2024  INDICATIONS: Aortic Regurgitation  PROCEDURE:   Informed consent was obtained prior to the procedure. The risks, benefits and alternatives for the procedure were discussed and the patient comprehended these risks.  Risks include, but are not limited to, cough, sore throat, vomiting, nausea, somnolence, esophageal and stomach trauma or perforation, bleeding, low blood pressure, aspiration, pneumonia, infection, trauma to the teeth and death.    Procedural time out performed. The oropharynx was anesthetized with viscous lidocaine .  Anesthesia was administered by the anaesthesilogy team. The patient's heart rate, blood pressure, and oxygen saturation were monitored continuously during the procedure.  The transesophageal probe was inserted in the esophagus and stomach without difficulty and multiple views were obtained including 3 dimensional imaging.  The patient tolerated the procedure well.  COMPLICATIONS:    There were no immediate complications.  KEY FINDINGS:  Aortic regurgitation visualized. Quantification pending, .  Full report to follow.   Hosea Hanawalt T. Floretta HEATH, MD [place]  [facility]  1:05 PM

## 2024-02-03 NOTE — Interval H&P Note (Signed)
 History and Physical Interval Note:  02/03/2024 12:17 PM  Phillip Hill  has presented today for surgery, with the diagnosis of AORTIC VALVE INSUFFICIENCY.  The various methods of treatment have been discussed with the patient and family. After consideration of risks, benefits and other options for treatment, the patient has consented to  Procedure(s): TRANSESOPHAGEAL ECHOCARDIOGRAM (N/A) as a surgical intervention.  The patient's history has been reviewed, patient examined, no change in status, stable for surgery.  I have reviewed the patient's chart and labs.  Questions were answered to the patient's satisfaction.     Georganna Archer

## 2024-02-03 NOTE — Discharge Instructions (Signed)

## 2024-02-03 NOTE — Progress Notes (Signed)
  Echocardiogram Echocardiogram Transesophageal has been performed.  Koleen KANDICE Popper, RDCS 02/03/2024, 1:11 PM

## 2024-02-03 NOTE — Transfer of Care (Signed)
 Immediate Anesthesia Transfer of Care Note  Patient: Phillip Hill  Procedure(s) Performed: TRANSESOPHAGEAL ECHOCARDIOGRAM  Patient Location: Cath Lab  Anesthesia Type:MAC  Level of Consciousness: drowsy and patient cooperative  Airway & Oxygen Therapy: Patient Spontanous Breathing and Patient connected to nasal cannula oxygen  Post-op Assessment: Report given to RN, Post -op Vital signs reviewed and stable, and Patient moving all extremities X 4  Post vital signs: Reviewed and stable  Last Vitals:  Vitals Value Taken Time  BP 95/49 02/03/24 13:04  Temp 36.8 C 02/03/24 13:04  Pulse 93 02/03/24 13:04  Resp 17 02/03/24 13:04  SpO2 96 % 02/03/24 13:04  Vitals shown include unfiled device data.  Last Pain:  Vitals:   02/03/24 1304  TempSrc: Tympanic  PainSc: Asleep         Complications: No notable events documented.

## 2024-02-03 NOTE — Anesthesia Preprocedure Evaluation (Addendum)
 Anesthesia Evaluation  Patient identified by MRN, date of birth, ID band Patient awake    Reviewed: Allergy & Precautions, H&P , NPO status , Patient's Chart, lab work & pertinent test results  History of Anesthesia Complications Negative for: history of anesthetic complications  Airway Mallampati: II  TM Distance: >3 FB Neck ROM: Full    Dental no notable dental hx.    Pulmonary former smoker   Pulmonary exam normal breath sounds clear to auscultation       Cardiovascular hypertension, Normal cardiovascular exam+ Valvular Problems/Murmurs AI  Rhythm:Regular Rate:Normal  IMPRESSIONS     1. Moderate hypertrophy of the basal septum with otherwise mild  concentric LVH. Left ventricular ejection fraction, by estimation, is 50  to 55%. Left ventricular ejection fraction by 3D volume is 54 %. The left  ventricle has low normal function. The  left ventricle has no regional wall motion abnormalities. There is  moderate asymmetric left ventricular hypertrophy of the basal-septal  segment. Left ventricular diastolic parameters are consistent with Grade I  diastolic dysfunction (impaired  relaxation). The average left ventricular global longitudinal strain is  -14.4 %. The global longitudinal strain is abnormal.   2. Right ventricular systolic function is normal. The right ventricular  size is normal. There is normal pulmonary artery systolic pressure.   3. The mitral valve is normal in structure. Mild mitral valve  regurgitation. No evidence of mitral stenosis.   4. Aortic valve leaflets do not fully coapt. Aortic regurgitation  pressure half time 293 ms. The aortic valve is tricuspid. Aortic valve  regurgitation is moderate to severe. No aortic stenosis is present.   5. Pulmonic valve regurgitation is moderate.   6. Aortic dilatation noted. There is mild dilatation of the aortic root,  measuring 41 mm.   7. The inferior vena cava is  normal in size with greater than 50%  respiratory variability, suggesting right atrial pressure of 3 mmHg.     Neuro/Psych Seizures -, Well Controlled,   negative psych ROS   GI/Hepatic negative GI ROS, Neg liver ROS,,,  Endo/Other  negative endocrine ROS    Renal/GU negative Renal ROS  negative genitourinary   Musculoskeletal  (+) Arthritis ,  S/p arm amputation   Abdominal   Peds negative pediatric ROS (+)  Hematology negative hematology ROS (+)   Anesthesia Other Findings   Reproductive/Obstetrics negative OB ROS                              Anesthesia Physical Anesthesia Plan  ASA: 3  Anesthesia Plan: MAC   Post-op Pain Management:    Induction: Intravenous  PONV Risk Score and Plan: 1 and Propofol  infusion and Treatment may vary due to age or medical condition  Airway Management Planned: Natural Airway  Additional Equipment:   Intra-op Plan:   Post-operative Plan:   Informed Consent: I have reviewed the patients History and Physical, chart, labs and discussed the procedure including the risks, benefits and alternatives for the proposed anesthesia with the patient or authorized representative who has indicated his/her understanding and acceptance.     Dental advisory given  Plan Discussed with: CRNA  Anesthesia Plan Comments:          Anesthesia Quick Evaluation

## 2024-02-10 ENCOUNTER — Ambulatory Visit: Admitting: Student

## 2024-03-20 ENCOUNTER — Other Ambulatory Visit: Payer: Self-pay | Admitting: Student

## 2024-03-20 DIAGNOSIS — I10 Essential (primary) hypertension: Secondary | ICD-10-CM

## 2024-03-23 ENCOUNTER — Ambulatory Visit: Attending: Internal Medicine | Admitting: Internal Medicine

## 2024-03-23 ENCOUNTER — Encounter: Payer: Self-pay | Admitting: Internal Medicine

## 2024-03-23 VITALS — BP 120/66 | HR 90 | Ht 66.0 in | Wt 165.0 lb

## 2024-03-23 DIAGNOSIS — I10 Essential (primary) hypertension: Secondary | ICD-10-CM | POA: Insufficient documentation

## 2024-03-23 DIAGNOSIS — R0989 Other specified symptoms and signs involving the circulatory and respiratory systems: Secondary | ICD-10-CM | POA: Insufficient documentation

## 2024-03-23 DIAGNOSIS — I3139 Other pericardial effusion (noninflammatory): Secondary | ICD-10-CM | POA: Insufficient documentation

## 2024-03-23 DIAGNOSIS — I739 Peripheral vascular disease, unspecified: Secondary | ICD-10-CM | POA: Diagnosis not present

## 2024-03-23 DIAGNOSIS — I351 Nonrheumatic aortic (valve) insufficiency: Secondary | ICD-10-CM | POA: Diagnosis not present

## 2024-03-23 DIAGNOSIS — G479 Sleep disorder, unspecified: Secondary | ICD-10-CM | POA: Insufficient documentation

## 2024-03-23 DIAGNOSIS — I493 Ventricular premature depolarization: Secondary | ICD-10-CM | POA: Diagnosis not present

## 2024-03-23 MED ORDER — METOPROLOL SUCCINATE ER 25 MG PO TB24: 25.0000 mg | ORAL_TABLET | Freq: Every day | ORAL | 1 refills | Status: DC

## 2024-03-23 MED ORDER — METOPROLOL SUCCINATE ER 25 MG PO TB24: 25.0000 mg | ORAL_TABLET | Freq: Every day | ORAL | 0 refills | Status: AC

## 2024-03-23 NOTE — Progress Notes (Signed)
 " Cardiology Office Note   Date:  03/23/2024  ID:  [name], DOB 1940/07/01, MRN [id number] PCP: Howell Lunger, DO  [facility] Providers Cardiologist:  Emeline FORBES Calender, DO     History of Present Illness Phillip Hill is a 83 y.o. male with a past medical history of s/p right upper extremity amputation remote posttrauma, hypertension, dilated aortic root, moderate concentric LVH more pronounced in the basal septum, moderate aortic regurgitation, moderate pulmonic regurgitation who initially presented on 11/7 to discuss aortic regurgitation after referral from his PCP.    At his visit with me on 01/30/2024 he had been noted to have exertional shortness of breath and orthopnea to his PCP but nothing around the time of the appointment and without any chest pain.  Also noted a very high salt intake at that time.  He was sent for a TEE for better visualization of his aortic valve.  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  There was also aortic dilatation noted and moderate dilatation of the aortic root at 45 mm.  Today he presents for follow-up and to discuss the results.  He has not had any lower extremity swelling or exertional shortness of breath however he reports that 1 night last week he woke up gasping for air which was brief.  He does not have any known history of sleep apnea.  He states that he has significantly improved his diet and decrease the salt intake and has had improved blood pressure today.  He also increased his metoprolol  succinate on his own from 25 mg to 50 mg.  He also reports complaints of poor circulation in his legs which is chronic but is unable to quantify further.     ROS:  Review of Systems  All other systems reviewed and are negative.   Physical Exam  Physical Exam Vitals and nursing note  reviewed.  Constitutional:      Appearance: Normal appearance.  HENT:     Head: Normocephalic and atraumatic.  Eyes:     Conjunctiva/sclera: Conjunctivae normal.  Cardiovascular:     Rate and Rhythm: Normal rate and regular rhythm.     Pulses: Decreased pulses.          Dorsalis pedis pulses are 2+ on the right side and 1+ on the left side.       Posterior tibial pulses are 1+ on the right side and 1+ on the left side.     Heart sounds: Murmur heard.     Diastolic murmur is present with a grade of 2/4.  Pulmonary:     Effort: Pulmonary effort is normal.     Breath sounds: Normal breath sounds.  Musculoskeletal:        General: No swelling.     Right lower leg: No edema.     Left lower leg: No edema.  Skin:    Coloration: Skin is not jaundiced or pale.  Neurological:     Mental Status: He is alert.     VS:  BP 120/66   Pulse 90   Ht 5' 6 (1.676 m)   Wt 165 lb (74.8 kg)   SpO2 96%   BMI 26.63 kg/m         Wt Readings from Last 3 Encounters:  03/23/24 165 lb (74.8 kg)  01/30/24 163 lb (73.9 kg)  12/01/23 165 lb 4 oz (75 kg)     EKG Interpretation Date/Time:    Ventricular Rate:    PR Interval:    QRS Duration:    QT Interval:    QTC Calculation:   R Axis:      Text Interpretation:      Studies Reviewed   TEE 02/03/2024:  1. Left ventricular ejection fraction, by estimation, is 55%. The left  ventricle has normal function. The left ventricle has no regional wall  motion abnormalities.   2. Right ventricular systolic function is normal. The right ventricular  size is normal.   3. No left atrial/left atrial appendage thrombus was detected.   4. A small pericardial effusion is present. The pericardial effusion is  anterior to the right ventricle. There is no evidence of cardiac  tamponade.   5. The mitral valve is normal in structure. Trivial mitral valve  regurgitation.   6. The aortic valve is tricuspid but abnormal. There is mild prolapse of  the  right coronary cusp with associated eccentric and posteriorly directed  aortic regurgitation. The severity of aortic regurgitation is moderate by  vena contracta (VC = 0.3) and  color doppler. Unfortunately, PISA measurements could not be performed due  to the eccentricity of the color jet and poor doppler angle.   7. Aortic dilatation noted. There is moderate dilatation of the aortic  root, measuring 45 mm.    Echocardiogram 11/18/2023:   1. Moderate hypertrophy of the basal septum with otherwise mild  concentric LVH. Left ventricular ejection fraction, by estimation, is 50  to 55%. Left ventricular ejection fraction by 3D volume is 54 %. The left  ventricle has low normal function. The  left ventricle has no regional wall motion abnormalities. There is  moderate asymmetric left ventricular hypertrophy of the basal-septal  segment. Left ventricular diastolic parameters are consistent with Grade I  diastolic dysfunction (impaired  relaxation). The average left ventricular global longitudinal strain is  -14.4 %. The global longitudinal strain is abnormal.   2. Right ventricular systolic function is normal. The right ventricular  size is normal. There is normal pulmonary artery systolic pressure.   3. The mitral valve is normal in structure. Mild mitral valve  regurgitation. No evidence of mitral stenosis.   4. Aortic valve leaflets do not fully coapt. Aortic regurgitation  pressure half time 293 ms. The aortic valve is tricuspid. Aortic valve  regurgitation is moderate to severe. No aortic stenosis is present.   5. Pulmonic valve regurgitation is moderate.   6. Aortic dilatation noted. There is mild dilatation of the aortic root,  measuring 41 mm.   7. The inferior vena cava is normal in size with greater than 50%  respiratory variability, suggesting right atrial pressure of 3 mmHg.       Risk Assessment/Calculations         ASCVD risk score: The ASCVD Risk score (Arnett DK,  et al., 2019) failed to calculate for the following reasons:   The 2019 ASCVD risk score is only valid for ages 63 to 11   * - Cholesterol units were assumed   ASSESSMENT  Moderate aortic insufficiency echo report from 11/18/2023 shows poor coaptation of aortic valve leaflets with eccentric regurgitation and a pressure half-time of 293 ms LV is mildly dilated by ESVi with EF is 54% by 3D assessment.  TEE from 11/11 shows moderate aortic regurgitation with better visualization of VC at 0.3 and prolapse of the right coronary cusp.  Currently not in heart failure and with normal ejection fraction.  He increased his beta-blocker to 50 mg daily which may worsen his aortic insufficiency. Small pericardial effusion noted on TEE anterior to the right ventricle.  No evidence of tamponade Hypertension BP 120/66 (previously SBP 170s).  Currently on amlodipine -valsartan  10/320 mg daily, hydrochlorothiazide  12.5 mg, chronic metoprolol  succinate 50 mg daily.  He has significantly improved his salt intake which is likely significantly improved his blood pressure PVCs currently on beta-blockade Decreased bilateral lower extremity pedal pulses with concern for claudication Mild mitral regurgitation Moderate pulmonic regurgitation Mild aortic root dilatation 45 millimeters by TEE Episode of nighttime shortness of breath, possibly undiagnosed sleep apnea Exertional shortness of breath seems resolved   Plan  He has been on chronic metoprolol  succinate, will reduce to 25 mg again in the setting of suspected aortic regurgitation but not discontinue to prevent withdrawal Continue to adhere to a restricted salt intake diet with goal less than 2000 mg of salt per day.   Repeat echocardiogram in February (6 months from prior study) to follow-up on aortic regurgitation, evaluate for LV dilatation and reevaluate the pericardial effusion.  Can consider cardiac MRI for definitive volumetric assessment Bilateral ABI Will  consider CTA of the aorta given his aortic dilatation Consider outpatient sleep study.  Will defer to PCP  Follow up: 2 to 3 months post echocardiogram    Signed, Emeline FORBES Calender, DO   "

## 2024-03-23 NOTE — Patient Instructions (Addendum)
 Medication Instructions:   START TAKING METOPROLOL   SUCCINATE  25 MG DAILY    *If you need a refill on your cardiac medications before your next appointment, please call your pharmacy*   Lab Work: NOT NEEDED   Testing/Procedures:  1)  SCHEDULE FOR FEB 2026 --(2 MONTHS) Your physician has requested that you have an LIMITED echocardiogram. Echocardiography is a painless test that uses sound waves to create images of your heart. It provides your doctor with information about the size and shape of your heart and how well your hearts chambers and valves are working. This procedure takes approximately one hour. There are no restrictions for this procedure. Please do NOT wear cologne, perfume, aftershave, or lotions (deodorant is allowed). Please arrive 15 minutes prior to your appointment time.  Please note: We ask at that you not bring children with you during ultrasound (echo/ vascular) testing. Due to room size and safety concerns, children are not allowed in the ultrasound rooms during exams. Our front office staff cannot provide observation of children in our lobby area while testing is being conducted. An adult accompanying a patient to their appointment will only be allowed in the ultrasound room at the discretion of the ultrasound technician under special circumstances. We apologize for any inconvenience.  2) Your physician has requested that you have an ankle brachial index (ABI). During this test an ultrasound and blood pressure cuff are used to evaluate the arteries that supply the arms and legs with blood. Allow thirty minutes for this exam. There are no restrictions or special instructions.  Please note: We ask at that you not bring children with you during ultrasound (echo/ vascular) testing. Due to room size and safety concerns, children are not allowed in the ultrasound rooms during exams. Our front office staff cannot provide observation of children in our lobby area while testing is  being conducted. An adult accompanying a patient to their appointment will only be allowed in the ultrasound room at the discretion of the ultrasound technician under special circumstances. We apologize for any inconvenience.  Follow-Up: At [facility], you and your health needs are our priority.  As part of our continuing mission to provide you with exceptional heart care, we have created designated Provider Care Teams.  These Care Teams include your primary Cardiologist (physician) and Advanced Practice Providers (APPs -  Physician Assistants and Nurse Practitioners) who all work together to provide you with the care you need, when you need it.     Your next appointment:    2 TO 3 month(s) AFTER ECHO   The format for your next appointment:   In Person  Provider:   Emeline FORBES Calender, DO

## 2024-03-30 ENCOUNTER — Other Ambulatory Visit: Payer: Self-pay | Admitting: Student

## 2024-03-31 ENCOUNTER — Other Ambulatory Visit: Payer: Self-pay | Admitting: Student

## 2024-03-31 ENCOUNTER — Ambulatory Visit: Payer: Self-pay | Admitting: Internal Medicine

## 2024-03-31 ENCOUNTER — Ambulatory Visit (HOSPITAL_COMMUNITY)
Admission: RE | Admit: 2024-03-31 | Discharge: 2024-03-31 | Disposition: A | Discharge status: Home / Self Care | Source: Ambulatory Visit | Attending: Internal Medicine | Admitting: Internal Medicine

## 2024-03-31 DIAGNOSIS — R0989 Other specified symptoms and signs involving the circulatory and respiratory systems: Secondary | ICD-10-CM | POA: Diagnosis present

## 2024-03-31 DIAGNOSIS — I739 Peripheral vascular disease, unspecified: Secondary | ICD-10-CM | POA: Diagnosis present

## 2024-03-31 DIAGNOSIS — I10 Essential (primary) hypertension: Secondary | ICD-10-CM

## 2024-04-01 LAB — VAS US ABI WITH/WO TBI

## 2024-04-22 NOTE — Telephone Encounter (Signed)
 Unable to reach patient as this time via phone.

## 2024-04-26 ENCOUNTER — Ambulatory Visit (HOSPITAL_COMMUNITY)

## 2024-05-11 ENCOUNTER — Ambulatory Visit: Admitting: Internal Medicine

## 2024-06-01 ENCOUNTER — Ambulatory Visit (HOSPITAL_COMMUNITY)

## 2024-09-02 ENCOUNTER — Encounter
